# Patient Record
Sex: Male | Born: 1955 | Race: White | Hispanic: No | Marital: Married | Smoking: Former smoker
Health system: Southern US, Community
[De-identification: ages and names within clinical notes are randomized; demographics above are authoritative.]

## PROBLEM LIST (undated history)

## (undated) DIAGNOSIS — E0590 Thyrotoxicosis, unspecified without thyrotoxic crisis or storm: Secondary | ICD-10-CM

## (undated) DIAGNOSIS — E669 Obesity, unspecified: Secondary | ICD-10-CM

## (undated) DIAGNOSIS — C719 Malignant neoplasm of brain, unspecified: Secondary | ICD-10-CM

## (undated) DIAGNOSIS — I10 Essential (primary) hypertension: Secondary | ICD-10-CM

## (undated) DIAGNOSIS — C7931 Secondary malignant neoplasm of brain: Secondary | ICD-10-CM

## (undated) DIAGNOSIS — Z8719 Personal history of other diseases of the digestive system: Secondary | ICD-10-CM

## (undated) DIAGNOSIS — R739 Hyperglycemia, unspecified: Secondary | ICD-10-CM

## (undated) DIAGNOSIS — F419 Anxiety disorder, unspecified: Secondary | ICD-10-CM

## (undated) DIAGNOSIS — I340 Nonrheumatic mitral (valve) insufficiency: Secondary | ICD-10-CM

## (undated) DIAGNOSIS — C3491 Malignant neoplasm of unspecified part of right bronchus or lung: Secondary | ICD-10-CM

## (undated) DIAGNOSIS — IMO0001 Reserved for inherently not codable concepts without codable children: Secondary | ICD-10-CM

## (undated) DIAGNOSIS — I4819 Other persistent atrial fibrillation: Secondary | ICD-10-CM

## (undated) HISTORY — DX: Other persistent atrial fibrillation: I48.19

## (undated) HISTORY — DX: Essential (primary) hypertension: I10

## (undated) HISTORY — DX: Thyrotoxicosis, unspecified without thyrotoxic crisis or storm: E05.90

## (undated) HISTORY — DX: Hyperglycemia, unspecified: R73.9

## (undated) HISTORY — DX: Obesity, unspecified: E66.9

## (undated) HISTORY — DX: Nonrheumatic mitral (valve) insufficiency: I34.0

## (undated) HISTORY — PX: OTHER SURGICAL HISTORY: SHX169

## (undated) HISTORY — DX: Malignant neoplasm of unspecified part of right bronchus or lung: C34.91

## (undated) HISTORY — PX: TONSILLECTOMY: SUR1361

## (undated) HISTORY — PX: VASECTOMY: SHX75

## (undated) HISTORY — DX: Secondary malignant neoplasm of brain: C79.31

---

## 2006-02-08 ENCOUNTER — Encounter: Admission: RE | Admit: 2006-02-08 | Discharge: 2006-02-08 | Payer: Self-pay | Admitting: Internal Medicine

## 2008-03-26 ENCOUNTER — Encounter: Payer: Self-pay | Admitting: Cardiovascular Disease

## 2008-03-26 ENCOUNTER — Inpatient Hospital Stay (HOSPITAL_COMMUNITY): Admission: AD | Admit: 2008-03-26 | Discharge: 2008-03-28 | Payer: Self-pay | Admitting: Neurosurgery

## 2008-03-26 ENCOUNTER — Ambulatory Visit: Payer: Self-pay | Admitting: Cardiovascular Disease

## 2008-04-23 ENCOUNTER — Ambulatory Visit: Payer: Self-pay | Admitting: Cardiovascular Disease

## 2008-04-23 ENCOUNTER — Ambulatory Visit: Payer: Self-pay

## 2008-05-01 ENCOUNTER — Ambulatory Visit: Payer: Self-pay

## 2008-06-02 ENCOUNTER — Ambulatory Visit: Payer: Self-pay | Admitting: Cardiovascular Disease

## 2009-02-21 DIAGNOSIS — E669 Obesity, unspecified: Secondary | ICD-10-CM | POA: Insufficient documentation

## 2009-02-21 DIAGNOSIS — I878 Other specified disorders of veins: Secondary | ICD-10-CM | POA: Insufficient documentation

## 2009-02-21 DIAGNOSIS — I4819 Other persistent atrial fibrillation: Secondary | ICD-10-CM | POA: Insufficient documentation

## 2009-02-21 DIAGNOSIS — I10 Essential (primary) hypertension: Secondary | ICD-10-CM | POA: Insufficient documentation

## 2009-03-02 ENCOUNTER — Ambulatory Visit: Payer: Self-pay | Admitting: Cardiovascular Disease

## 2009-03-02 DIAGNOSIS — D692 Other nonthrombocytopenic purpura: Secondary | ICD-10-CM | POA: Insufficient documentation

## 2009-03-03 ENCOUNTER — Telehealth: Payer: Self-pay | Admitting: Cardiovascular Disease

## 2010-02-11 ENCOUNTER — Encounter: Payer: Self-pay | Admitting: Cardiology

## 2010-02-11 ENCOUNTER — Ambulatory Visit: Payer: Self-pay | Admitting: Cardiovascular Disease

## 2010-10-03 LAB — CONVERTED CEMR LAB
Basophils Relative: 0.3 % (ref 0.0–3.0)
Eosinophils Relative: 2.7 % (ref 0.0–5.0)
INR: 0.9 (ref 0.8–1.0)
Lymphocytes Relative: 22.2 % (ref 12.0–46.0)
PSA: 0.8 ng/mL (ref 0.10–4.00)
Platelets: 176 10*3/uL (ref 150.0–400.0)
RBC: 4.38 M/uL (ref 4.22–5.81)

## 2010-10-07 NOTE — Letter (Signed)
Summary: Return To Work  [employer], Main Office  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     Sincerely,   DR Asher Muir, LPN

## 2010-10-07 NOTE — Assessment & Plan Note (Signed)
Summary: per check out/sf   Primary Provider:  Dr Chilton Si   History of Present Illness: [name] seen today in followup for his paroxysmal atrial fibrillation.  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  When he  had his fibrillation he was somewhat oblivious to it  with no palpitations.  He's been well controlled without a recurrence in quite some time on low-dose flecainide   He had a nonischemic Myoview in August of 2009.  I reviewed his EKG today and his QT interval was only 406.  Otherwise he had normal sinus rhythm at a rate of 62 with a normal ECG.  Been compliant with his medications.  Coronary risk  factors are otherwise unremarkable he continues to be somewhat heavy due to obstructing lifestyle.  There's been no exertional chest pain PND or orthopnea he's had trace lower extremity edema.  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 Take One Tablet By Mouth Twice A Day 2)  Flecainide Acetate 50 Mg Tabs (Flecainide Acetate) .Marland Kitchen.. 1 Tab By Mouth Two Times A Day 3)  Aspirin Ec 325 Mg Tbec (Aspirin) .... Take One Tablet As Needed  Allergies (verified): No Known Drug Allergies  Past History:  Past Medical History: Last updated: 02/21/2009 Current Problems:  HYPERTENSION (ICD-401.9) PAROXYSMAL ATRIAL FIBRILLATION (ICD-427.31) OBESITY (ICD-278.00) EDEMA (ICD-782.3)  Past Surgical History: Last updated: 02/21/2009  lower back surgery with Dr. Phoebe Perch.  Herniated nucleus pulposus left L5-S1; semihemilaminectomy and   diskectomy with microdissection with microscope per Dr. Phoebe Perch.      Right cornea repair secondary to injury from a  bungee cord    tonsillectomy.   Family History: Last  updated: 03/02/2009 non contributory  Social History: Last updated: 03/02/2009 Married Trucker Non-drinker Non-smoker  Review of Systems       Denies fever, malais, weight loss, blurry vision, decreased visual acuity, cough, sputum, SOB, hemoptysis, pleuritic pain, palpitaitons, heartburn, abdominal pain, melena, lower extremity edema, claudication, or rash.   Vital Signs:  Patient profile:   55 year old male Height:      71 inches Weight:      269 pounds BMI:     37.65 Pulse rate:   57 / minute Resp:     12 per minute BP sitting:   120 / 80  (left arm)  Vitals Entered By: Kem Parkinson (February 11, 2010 9:07 AM)  Physical Exam  General:  Affect appropriate Healthy:  appears stated age HEENT: normal Neck supple with no adenopathy JVP normal no bruits no thyromegaly Lungs clear with no wheezing and good diaphragmatic motion Heart:  S1/S2 no murmur,rub, gallop or click PMI normal Abdomen: benighn, BS positve, no tenderness, no AAA no bruit.  No HSM or HJR Distal pulses intact with no bruits No edema Neuro non-focal Skin warm and dry    Impression & Recommendations:  Problem # 1:  HYPERTENSION (ICD-401.9) Well controlled His updated medication list for this problem includes:    Metoprolol Tartrate 25 Mg Tabs (Metoprolol tartrate) .Marland Kitchen... Take one tablet by mouth twice a day    Aspirin Ec 325 Mg Tbec (Aspirin) .Marland Kitchen... Take one tablet as needed  Problem # 2:  PAROXYSMAL ATRIAL FIBRILLATION (ICD-427.31)  Maint NSR  QT ok on ECG  His updated medication list for this problem includes:    Metoprolol Tartrate 25 Mg Tabs (Metoprolol tartrate) .Marland Kitchen... Take one tablet by mouth twice a day    Flecainide Acetate 50 Mg Tabs (Flecainide acetate) .Marland Kitchen... 1 tab by mouth two times a day    Aspirin Ec 325 Mg Tbec (Aspirin) .Marland Kitchen... Take one tablet as needed  Orders: EKG w/ Interpretation (93000)  Problem # 3:  EDEMA (ICD-782.3) Dependant.  Continue to work on weight and low sodium  intake  Problem # 4:  SPECIAL SCREENING MALIGNANT NEOPLASM OF PROSTATE (ICD-V76.44) Has not seen primary in long time (Art Green)  will check PSA Orders: TLB-PSA (Prostate Specific Antigen) (84153-PSA)  Patient Instructions: 1)  Your physician recommends that you schedule a follow-up appointment in: YEAR WITH DR Eden Emms 2)  Your physician recommends that you return for lab work in: TODAY PSA 3)  Your physician recommends that you continue on your current medications as directed. Please refer to the Current Medication list given to you today.   EKG Report  Procedure date:  02/11/2010  Findings:      NSR 57 QT 408 Low Voltage

## 2011-01-18 NOTE — Op Note (Signed)
NAMETarry, [name]              ACCOUNT NO.:  [id number]   MEDICAL RECORD NO.:  [id number]          PATIENT TYPE:  OIB   LOCATION:  2013                         FACILITY:  [facility]   PHYSICIAN:  Clydene Fake, M.D.  DATE OF BIRTH:  May 28, 1956   DATE OF PROCEDURE:  03/27/2008  DATE OF DISCHARGE:                               OPERATIVE REPORT   PREOPERATIVE DIAGNOSIS:  Herniated nucleus pulposus, left L5-S1.   POSTOPERATIVE DIAGNOSIS:  Herniated nucleus pulposus, left L5-S1.   PROCEDURE:  Left L5-S1 semihemilaminectomy and diskectomy,  microdissection with microscope.   SURGEON:  Clydene Fake, M.D.   ASSISTANT:  Stefani Dama, M.D.   ANESTHESIA:  General endotracheal tube.   ESTIMATED BLOOD LOSS:  Minimal.   BLOOD GIVEN:  None.   DRAINS:  None.   COMPLICATIONS:  None.   REASON FOR PROCEDURE:  The patient is a 55 year old gentleman who has  had back and left leg pain and numbness, found to have weakness in  plantarflexion.  Decreased sensation, left toes, was noticed.  Recent  MRI was done, showing large disc herniation, left side L5-S1, and  compression of the left S1 nerve root.  The patient was brought in for  decompression.   PROCEDURE IN DETAIL:  The patient was brought to the operating room, and  general anesthesia was induced.  The patient was placed in prone  position on a Wilson frame with all pressure points padded.  The patient  was prepped and draped in standard sterile fashion.  Stab incision was  injected with 17 mL of 1% lidocaine with epinephrine.  Needle was placed  in the interspace.  X-ray was obtained showing the needle was pointing  at the S1 spinous process.  Incision was then made where the needle was  placed.  Incision taken down to the fascia.  Hemostasis was obtained by  Bovie cauterization, and fascia was incised over the L5 and S1 spinous  process and subperiosteal dissection was done on the left side exposing  the spinous process,  lamina, and facet.  A self-retaining retractor was  placed, and marker was placed in the interspace.  X-rays were obtained  confirming positioning at L5-S1.  A high-speed drill was used to start a  semihemilaminectomy and medial facetectomy, and this was completed with  Kerrison punches and foraminotomy done over the S1 root.  A clinical  microscope was brought in for microdissection.  Ligamentum flavum was  removed.  We explored the epidural space and saw a large paramedian disc  herniation.  We incised into the disk space and diskectomy was then done  with pituitary rongeurs and curettes.  When we were finished, we had  good decompression of the central canal and S1 nerve root, and we  checked the L5 root, and it was also decompressed.  We irrigated with  antibiotic solution, had good hemostasis, retractor was  removed, fascia closed with 0 Vicryl interrupted sutures, subcutaneous  tissue closed with 0, 2-0, and 3-0 Vicryl interrupted sutures, skin  closed with benzoin and Steri-Strips.  Dressing was placed.  The patient  was placed  back into supine position, awoke from anesthesia, and  transferred to Recovery Room in stable condition.           ______________________________  Clydene Fake, M.D.     JRH/MEDQ  D:  03/27/2008  T:  03/28/2008  Job:  [id number]

## 2011-01-18 NOTE — Letter (Signed)
March 02, 2009    Safety [employer], [employer]   RE:  TAHJAE, CLAUSING  MRN:  [id number]  /  DOB:  1956-05-26   To Whom it May Concern:   Mr. Leonhard is a long-term patient of [facility].  He has a  fairly distant history of paroxysmal atrial fibrillation.  This has been  extremely well controlled.  He has never had documented coronary  disease.  He does not get palpitations.  He has been stable on low-dose  flecainide therapy.  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  If you have any questions, do not hesitate to  contact me.    Sincerely,      Noralyn Pick. Eden Emms, MD, [facility]  Electronically Signed    PCN/MedQ  DD: 03/02/2009  DT: 03/02/2009  Job #: [id number]   CC:    [name]

## 2011-01-18 NOTE — H&P (Signed)
[name], [name]              ACCOUNT NO.:  [id number]   MEDICAL RECORD NO.:  [id number]          PATIENT TYPE:  INP   LOCATION:  2013                         FACILITY:  [facility]   PHYSICIAN:  Jason Pick. Eden Emms, MD, FACCDATE OF BIRTH:  [birth year]   DATE OF ADMISSION:  03/27/2008  DATE OF DISCHARGE:                              HISTORY & PHYSICAL   PRIMARY CARE PHYSICIAN:  Jason Speed, MD   ORTHOPEDIC SURGEON:  Jason Fake, MD   CARDIOLOGIST:  [name], Jason Pick. Eden Emms, MD, [facility]   HISTORY OF PRESENT ILLNESS:  This is a 55 year old obese, Caucasian male  without prior cardiac history who was seen in Short Stay for preop labs  and EKG prior to lumbar laminectomy per Dr. Phoebe Perch secondary to pain in  the lower extremities.  A routine EKG found the patient to be in atrial  fibrillation with RVR with a ventricular rate of 115.  Dr. Phoebe Perch  requested a cardiology consultation secondary to this.  The patient has  been unaware of an irregular heart rate.  He states that he had been  evaluated at primary care approximately 1 week ago and was not told that  he had had an irregular heart rate at that time.  The patient denies any  chest pain, shortness of breath, dizziness, nausea, vomiting,  diaphoresis, or weakness.  The patient does admit to some lower  extremity pain on the left.  He also complains of occasional right eye  blindness, and states that he had had a carotid ultrasound completed  which was negative.   PAST MEDICAL HISTORY:  Lumbar disk disease.  He is negative for  diabetes, heart disease, hypertension, and peptic ulcer disease.   PAST SURGICAL HISTORY:  Right cornea repair secondary to injury from a  bungee cord and tonsillectomy.   SOCIAL HISTORY:  [place].  He  is a Naval architect.  He is a 35-pack-year smoker but quit 6 years ago  with rare EtOH use.   FAMILY HISTORY:  Mother is in good health.  Father is deceased from  cancer.  He has 1 brother with diabetes.   CURRENT MEDICATIONS:  Hydrocodone p.r.n.   ALLERGIES:  No known drug allergies.   CURRENT LABS:  Hemoglobin of 15.6, hematocrit 47.5, white blood cells  8.6, and platelets 214,000.  Sodium 139, potassium 4.7, chloride 104,  CO2 of 27, BUN 19, creatinine 1.3, and glucose 80.  PTT 28, PT 13.1, and  INR 1.0.  Urinalysis negative.  Chest x-ray revealing peribronchial  thickening with no active disease.  EKG reveals atrial fibrillation with  a ventricular rate of 115 beats per minute without any ischemic changes.   PHYSICAL EXAM:  VITAL SIGNS:  Blood pressure 128/74, pulse 109,  respirations 16, temperature 97.8, and O2 sat 96% on room air.  HEENT:  Head is normocephalic and atraumatic.  EYES:  PERRLA.  Mucous membranes of mouth pink and moist.  Tongue is  midline.  NECK:  Supple.  There is no JVD.  No carotid bruits appreciated.  CARDIOVASCULAR:  Irregular rhythm, tachycardiac without murmurs, rubs,  or gallops.  Pulses are 2+ and equal without bruits.  LUNGS:  Essentially clear to auscultation without wheezes, rales, or  rhonchi.  ABDOMEN:  Obese and nontender with 2+ bowel sounds.  EXTREMITIES:  Without clubbing, cyanosis, or edema.  NEURO:  Cranial nerves II through XII are grossly intact.   IMPRESSION:  1. Knapp onset atrial fibrillation of unknown duration.  2. Lumbar disk disease.   PLAN:  This is a 55 year old obese Caucasian male who was seen in Short  Stay today for preop evaluation, labs, and routine EKG, was found to be  in Knapp onset atrial fibrillation, unknown duration.  The patient had  planned lumbar disk surgery in the a.m. of March 27, 2008.  Secondary to  the abnormal EKG, we were asked to see the patient.  Our plan Jason be to  admit the patient and start Lopressor 25 mg b.i.d.  We Jason check  echocardiogram tonight, and the patient is still eligible for back  surgery.  He Jason need anticoagulation postoperatively.  He has  also  been advised that he can use esmolol during OR if it is needed.  The  patient has been advised of this and is willing to be admitted.  Dr.  Charlton Haws has spoken with Dr. Phoebe Perch concerning the patient's status  and admission.  The patient Jason be admitted to telemetry, and we Jason  follow making further recommendations after echocardiogram has been  completed.      Jason Mare. Lyman Bishop, NP      Jason Pick. Eden Emms, MD, [facility]  Electronically Signed    KML/MEDQ  D:  03/26/2008  T:  03/27/2008  Job:  [id number]   cc:   [name], M.D.

## 2011-01-18 NOTE — Assessment & Plan Note (Signed)
[facility]                            CARDIOLOGY OFFICE NOTE   NAME:Windmiller, [name]                     MRN:          [id number]  DATE:04/23/2008                            DOB:          [birth year]    Mr. [name] returns today for followup.  I initially saw him as a  consult in the PACU prior to lower back surgery with Dr. Phoebe Perch.  He was  in atrial fibrillation, which was asymptomatic.  He had never had a  previous diagnosis of this.  We started him on beta-blockers and allowed  him to have a surgery.  His surgery was uncomplicated.  He had an  echocardiogram prior to surgery, which showed normal LV function and no  significant valvular heart disease.   The patient has done well since being discharged from the hospital.  He  continues to have some paresthesias in his feet.   He has a followup appointment with Dr. Phoebe Perch given the fact that we  initially saw him as an unknown entity and he was needing surgery.  Coumadin was not started.  I had a long discussion with the patient  today involving our options.  I explained to him that with atrial  fibrillation, there are issues regarding rate control conversion and  anticoagulation.   He is a bit resident to be on Coumadin.  He is also a bit resident at  this time to undergo any type of cardioversion.  He was in atrial  fibrillation at a rate of 90 in the office today.   After much discussion, we decided to do the following.   It was fairly clear to me that the patient was at least in and out of  atrial fibrillation quite a bit, the only 2 times I have seen him, he  was in it.  We will start flecainide 50 b.i.d. as he does not appear to  have structural heart disease and is asymptomatic.  The risk of  proarrhythmia was discussed along this line.  He will follow up with a  stress Myoview next week to rule out concomitant coronary artery disease  and assess his ability to exercise.   I will  give him an event monitor for 30 days and see how often he is in  atrial fibrillation.  I told him to make sure he transmit say at least  twice a day regardless of his symptoms.  He seems to be happier with  this plan.   REVIEW OF SYSTEMS:  Otherwise negative in particular, he has not had any  significant TIAs, palpitations, chest pain, PND, or orthopnea.  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   PHYSICAL EXAMINATION:  GENERAL:  Remarkable for an obese white male in  no distress.  VITAL SIGNS:  Blood pressure is 130/70; pulse 94 and regular; and  respiratory rate 14, afebrile.  HEENT:  Unremarkable.  NECK:  Carotids are normal without bruit.  No lymphadenopathy,  thyromegaly, or JVP elevation.  LUNGS:  Clear, good diaphragmatic motion.  No wheezing.  CARDIAC:  S1 and  S2 with normal heart sounds.  PMI normal.  ABDOMEN:  Benign.  Bowel sounds positive.  No AAA.  No tenderness,  No  bruit.  No hepatosplenomegaly or hepatojugular  Reflux.  No tenderness.  EXTREMITIES:  Distal pulses are intact.  No edema.  NEURO:  Nonfocal.  SKIN:  Warm and dry.  MUSCULOSKELETAL:  No muscular weakness.  He is status post lumbar  surgery.   MEDICATIONS:  He is on aspirin a day and Lopressor 25 b.i.d.   His EKG shows atrial fibrillation with a QT interval of 316, nonspecific  ST-T wave changes.   His weight is 267.   IMPRESSION:  1. Atrial fibrillation.  Lengthy discussion regarding options for      antiarrhythmics rate control and anticoagulation.  Continue aspirin      for anticoagulation.  Continue Lopressor at current dose for rate      control, may need to increase in the future.  Add flecainide 50      b.i.d. for stabilization of rhythm.  2. Rule out concomitant coronary artery disease given the use of      antiarrhythmic stress Myoview in next week.  3. Recent lumbar back surgery.  Follow up Dr. Phoebe Perch  4. Continued paresthesias.      Noralyn Pick. Eden Emms, MD, [facility]   Electronically Signed    PCN/MedQ  DD: 04/23/2008  DT: 04/23/2008  Job #: [id number]

## 2011-01-18 NOTE — Assessment & Plan Note (Signed)
[facility]                            CARDIOLOGY OFFICE NOTE   NAME:Boonstra, Vence Judie Petit                     MRN:          [id number]  DATE:06/02/2008                            DOB:          01-31-1956    Mr. Corriher returns today in followup.  He is back to work.  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  He has had PAF.   His event monitor confirmed this.  He has not had any symptoms on  flecainide.  Unfortunately, he really does not know if he is in and out  of fibrillation.  I told him how to take his pulse.  He will also  possibly buy an inexpensive stethoscope since his radial pulse is  difficult to palpate.  For the time being, his [place] score is under 2 and  we will keep him on aspirin.   He knows to call me, should he notice any irregular high heart rates.  He will continue his beta-blocker and flecainide.   REVIEW OF SYSTEMS:  Otherwise negative.  In particularly, he has not had  any significant chest pain, PND, orthopnea, or syncope.  He has had  trace lower extremity edema.   We had a bit of discussion today about his diet and weight.  He has way  too much bread and is significantly obese.   MEDICATIONS:  1. Aspirin.  2. Lopressor 25 b.i.d.  3. Flecainide 50 b.i.d.   PHYSICAL EXAMINATION:  GENERAL:  Remarkable for an overweight white male  in no distress.  VITAL SIGNS:  His weight is 267, blood pressure is 110/66, pulse 66 and  regular, respiratory rate 14, afebrile.  HEENT:  Unremarkable.  NECK:  Carotids normal without bruit.  No lymphadenopathy, thyromegaly,  or JVP elevation.  LUNGS:  Clear.  Good diaphragmatic motion.  No wheezing.  S1 and S2 with  normal heart sounds.  PMI normal.  ABDOMEN:  Benign.  Bowel sounds positive.  No AAA.  No tenderness.  No  bruit.  No hepatosplenomegaly or hepatojugular reflux.  No tenderness.  EXTREMITIES:  Distal pulses are intact.  No edema.  NEUROLOGICAL:  Nonfocal.  SKIN:  Warm and  dry.  MUSCULOSKELETAL:  No muscular weakness.   IMPRESSION:  1. Paroxysmal atrial fibrillation.  Continue current dose of      flecainide and aspirin.  2. Hypertension, currently well controlled.  Continue low-dose      Lopressor.  3. Lower extremity edema.  Decrease salt intake, dependent secondary      to central obesity.  4. Central obesity.  Decrease caloric intake particularly bread,      target weight 10 pounds less than 6 months.   I will see the patient back in 6 months.  His event monitor was reviewed  and confirmed episodes of paroxysmal atrial fibrillation prior to  institution of flecainide.     Noralyn Pick. Eden Emms, MD, [facility]  Electronically Signed    PCN/MedQ  DD: 06/02/2008  DT: 06/03/2008  Job #: [id number]

## 2011-01-18 NOTE — Discharge Summary (Signed)
NAMEVega, [name]              ACCOUNT NO.:  [id number]   MEDICAL RECORD NO.:  [id number]          PATIENT TYPE:  INP   LOCATION:  2013                         FACILITY:  [facility]   PHYSICIAN:  Noralyn Pick. Eden Emms, MD, FACCDATE OF BIRTH:  1956/04/10   DATE OF ADMISSION:  03/26/2008  DATE OF DISCHARGE:  03/28/2008                               DISCHARGE SUMMARY   REQUESTING PHYSICIAN:  Clydene Fake, MD   PRIMARY CARDIOLOGIST:  Noralyn Pick. Eden Emms, MD, [facility]   PRIMARY CARE PHYSICIAN:  Not listed.   CONSULTING PHYSICIAN:  Clydene Fake, MD   PROCEDURES PERFORMED DURING HOSPITALIZATION:  1. Echocardiogram.  2. Herniated nucleus pulposus left L5-S1; semihemilaminectomy and      diskectomy with microdissection with microscope per Dr. Phoebe Perch.   FINAL DISCHARGE DIAGNOSES:  1. Herniated disk of the L5-S1.  2. Transient atrial fibrillation.   HOSPITAL COURSE:  This is a 55 year old obese Caucasian male with no  prior cardiac history who we saw in Short Stay at the request of Dr.  Phoebe Perch for preop evaluation and was found to be in atrial fibrillation  with RVR.  The patient had been unaware of his irregular heart rate and  he was evaluated by primary care prior to his admission and no one had  him his heart rate was irregular.  However[facility], and was  found to be in atrial fibrillation.  Dr. Phoebe Perch requested that we  evaluate the patient and admit if necessary prior to his surgery  secondary to this new-onset atrial fibrillation.  The patient was seen  and examined by myself and Dr. Charlton Haws and it was found that he was  indeed in atrial fibrillation with a ventricular rate of 115 beats per  minute.  The patient was started on p.o. Lopressor 25 mg b.i.d. and  converted to normal sinus rhythm within 6 hours of use of the Lopressor.  The patient had no further episodes of atrial fibrillation.  An  echocardiogram was completed prior to surgery for evaluation of LV  dysfunction and was found to be normal with LVEF of 50-55%.  There was  no diagnostic left ventricular wall motion abnormalities.   The patient did undergo disk surgery per Dr. Phoebe Perch the following day  without any complications and was discharged home later that afternoon  with no recurrence of atrial fibrillation.  The patient was to follow up  with Cardiology as an outpatient for continued evaluation at Dr.  Doreen Beam discretion.  A followup appointment was made for April 18, 2008, at 10:15 a.m. with Dr. Eden Emms to follow up.   DISCHARGE LABORATORY DATA:  TSH 1.880, sodium 139, potassium 4.7,  chloride 104, CO2 27, glucose 80, BUN 19, and creatinine 1.31.  PTT 28,  PT 13.1, and INR 1.0.  UA negative for UTI.  CBC:  Hemoglobin 15,  hematocrit 47.5, white blood cells 8.6, and platelets 214.  Chest x-ray  read peribronchial thickening, but no active disease.   DISCHARGE MEDICATIONS:  1. Aspirin 325 mg daily.  2. Lopressor 25 mg twice a day.   ALLERGIES:  No known drug allergies.   FOLLOWUP APPOINTMENTS:  1. The patient will be followed by Dr. Charlton Haws on April 18, 2008, at 10:15 a.m.  2. The patient will be followed by Dr. Phoebe Perch as an outpatient for      postoperative evaluation and continuation of treatment.   TIME SPENT WITH THE PATIENT TO INCLUDE PHYSICIAN TIME:  45 minutes.      Bettey Mare. Lyman Bishop, NP      Noralyn Pick. Eden Emms, MD, [facility]  Electronically Signed    KML/MEDQ  D:  04/20/2008  T:  04/21/2008  Job:  [id number]

## 2011-01-21 NOTE — Letter (Signed)
May 15, 2008    To Whomever It May Concern.   RE:  Jason, Knapp  MRN:  [id number]  /  DOB:  1955/12/06   [name] is a 55 year old patient who has been seen by [facility].  He had a brief episode of atrial arrhythmia prior to lumbar  surgery on March 27, 2008.  He has been worked up thoroughly since that  time.  His arrhythmia has resolved.  He has good heart function and no  evidence of coronary artery disease.  He has successfully completed a  stress test on May 01, 2008 without any significant abnormalities.  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  There should be no  limitations on his ability in regards to his driver's license.   If you have any questions, do not hesitate to contact me.    Sincerely,      Noralyn Pick. Eden Emms, MD, [facility]  Electronically Signed    PCN/MedQ  DD: 05/15/2008  DT: 05/16/2008  Job #: [id number]

## 2011-02-04 ENCOUNTER — Encounter: Payer: Self-pay | Admitting: Cardiovascular Disease

## 2011-02-21 ENCOUNTER — Encounter: Payer: Self-pay | Admitting: Cardiology

## 2011-02-21 ENCOUNTER — Encounter: Payer: Self-pay | Admitting: Cardiovascular Disease

## 2011-02-21 ENCOUNTER — Ambulatory Visit (INDEPENDENT_AMBULATORY_CARE_PROVIDER_SITE_OTHER): Payer: BC Managed Care – PPO | Admitting: Cardiovascular Disease

## 2011-02-21 DIAGNOSIS — R609 Edema, unspecified: Secondary | ICD-10-CM

## 2011-02-21 DIAGNOSIS — I10 Essential (primary) hypertension: Secondary | ICD-10-CM

## 2011-02-21 DIAGNOSIS — I4891 Unspecified atrial fibrillation: Secondary | ICD-10-CM

## 2011-02-21 MED ORDER — METOPROLOL TARTRATE 25 MG PO TABS: 25.0000 mg | ORAL_TABLET | Freq: Two times a day (BID) | ORAL | Status: DC

## 2011-02-21 MED ORDER — FLECAINIDE ACETATE 50 MG PO TABS: 50.0000 mg | ORAL_TABLET | Freq: Two times a day (BID) | ORAL | Status: DC

## 2011-02-21 NOTE — Progress Notes (Signed)
[name] seen today in followup for his paroxysmal atrial fibrillation. 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 When he had his fibrillation he was somewhat oblivious to it with no palpitations. He's been well controlled without a recurrence in quite some time on low-dose flecainide He had a nonischemic Myoview in August of 2009. I reviewed his EKG today and his QT interval was only 406. Otherwise he had normal sinus rhythm at a rate of 62 with a normal ECG. Been compliant with his medications. Coronary risk factors are otherwise unremarkable he continues to be somewhat heavy due to trucking  lifestyle. There's been no exertional chest pain PND or orthopnea he's had trace lower extremity edema. This is dependent  Needs [employer] refill on meds and DOT letter  ROS: Denies fever, malais, weight loss, blurry vision, decreased visual acuity, cough, sputum, SOB, hemoptysis, pleuritic pain, palpitaitons, heartburn, abdominal pain, melena, lower extremity edema, claudication, or rash.  All other systems reviewed and negative  General: Affect appropriate Healthy:  appears stated age HEENT: normal Neck supple with no adenopathy JVP normal no bruits no thyromegaly Lungs clear with no wheezing and good diaphragmatic motion Heart:  S1/S2 no murmur,rub, gallop or click PMI normal Abdomen: benighn, BS positve, no tenderness, no AAA no bruit.  No HSM or HJR Distal pulses intact with no bruits No edema Neuro non-focal Skin warm and dry No muscular weakness   Current Outpatient Prescriptions  Medication Sig Dispense Refill  . aspirin EC 325 MG tablet Take 325 mg by mouth daily as needed.        . flecainide (TAMBOCOR) 50 MG tablet Take 50 mg by mouth 2 (two) times daily.        . metoprolol tartrate (LOPRESSOR) 25 MG tablet Take 25 mg by mouth 2 (two) times daily.          Allergies  Review of patient's allergies indicates no known allergies.  Electrocardiogram:  NSR 62 Normal ECG  QRS 84  QT 400  Assessment and Plan

## 2011-02-21 NOTE — Assessment & Plan Note (Signed)
Well controlled.  Continue current medications and low sodium Dash type diet.    

## 2011-02-21 NOTE — Assessment & Plan Note (Signed)
maint NSR.  ECG looks great.  Continue ASA/Flecainide and beta blocker

## 2011-02-21 NOTE — Patient Instructions (Addendum)
Your physician recommends that you schedule a follow-up appointment in: 1 year  

## 2011-02-21 NOTE — Assessment & Plan Note (Signed)
Dependant.  Continue low sodium diet

## 2011-06-03 LAB — PROTIME-INR
INR: 1
Prothrombin Time: 13.1

## 2011-06-03 LAB — URINALYSIS, ROUTINE W REFLEX MICROSCOPIC
Glucose, UA: NEGATIVE
Hgb urine dipstick: NEGATIVE
Ketones, ur: NEGATIVE
Specific Gravity, Urine: 1.028
Urobilinogen, UA: 1
pH: 5

## 2011-06-03 LAB — CBC
HCT: 47.5
Hemoglobin: 15.6
Platelets: 214
RBC: 5.04
RDW: 13

## 2011-06-03 LAB — APTT: aPTT: 28

## 2011-06-03 LAB — BASIC METABOLIC PANEL
BUN: 19
Creatinine, Ser: 1.31

## 2012-02-13 ENCOUNTER — Ambulatory Visit (INDEPENDENT_AMBULATORY_CARE_PROVIDER_SITE_OTHER): Payer: BC Managed Care – PPO | Admitting: Cardiovascular Disease

## 2012-02-13 ENCOUNTER — Encounter: Payer: Self-pay | Admitting: *Deleted

## 2012-02-13 ENCOUNTER — Encounter: Payer: Self-pay | Admitting: Cardiovascular Disease

## 2012-02-13 VITALS — BP 128/80 | HR 76 | Ht 70.0 in | Wt 231.8 lb

## 2012-02-13 DIAGNOSIS — I4891 Unspecified atrial fibrillation: Secondary | ICD-10-CM

## 2012-02-13 DIAGNOSIS — E669 Obesity, unspecified: Secondary | ICD-10-CM

## 2012-02-13 DIAGNOSIS — I10 Essential (primary) hypertension: Secondary | ICD-10-CM

## 2012-02-13 LAB — HEPATIC FUNCTION PANEL
ALT: 29 U/L (ref 0–53)
AST: 27 U/L (ref 0–37)
Albumin: 3.5 g/dL (ref 3.5–5.2)
Alkaline Phosphatase: 89 U/L (ref 39–117)

## 2012-02-13 LAB — BASIC METABOLIC PANEL
BUN: 18 mg/dL (ref 6–23)
CO2: 25 mEq/L (ref 19–32)
Potassium: 4.2 mEq/L (ref 3.5–5.1)

## 2012-02-13 LAB — CBC WITH DIFFERENTIAL/PLATELET
Basophils Absolute: 0 10*3/uL (ref 0.0–0.1)
Basophils Relative: 0.2 % (ref 0.0–3.0)
Eosinophils Absolute: 0.1 10*3/uL (ref 0.0–0.7)
HCT: 44.5 % (ref 39.0–52.0)
Lymphocytes Relative: 24.9 % (ref 12.0–46.0)
Lymphs Abs: 1.7 10*3/uL (ref 0.7–4.0)
MCHC: 32.6 g/dL (ref 30.0–36.0)
Neutro Abs: 4.2 10*3/uL (ref 1.4–7.7)
Neutrophils Relative %: 60.7 % (ref 43.0–77.0)

## 2012-02-13 LAB — LIPID PANEL
Cholesterol: 126 mg/dL (ref 0–200)
HDL: 32.7 mg/dL — ABNORMAL LOW (ref >39.00)
LDL Cholesterol: 71 mg/dL (ref 0–99)

## 2012-02-13 NOTE — Patient Instructions (Addendum)
Your physician wants you to follow-up in: YEAR WITH  DR Haywood Filler will receive a reminder letter in the mail two months in advance. If you don't receive a letter, please call our office to schedule the follow-up appointment. Your physician recommends that you continue on your current medications as directed. Please refer to the Current Medication list given to you today. Your physician recommends that you return for lab work in: TODAY  BMET CBC LIPID LIVER DX   401.1  [phone]

## 2012-02-13 NOTE — Assessment & Plan Note (Signed)
Maint NSR continue flecainide.  ECG normal

## 2012-02-13 NOTE — Progress Notes (Signed)
Patient ID: [name], male   DOB: [birth year], 56 y.o.   MRN: [id number] [name] seen today in followup for his paroxysmal atrial fibrillation. 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 When he had his fibrillation he was somewhat oblivious to it with no palpitations. He's been well controlled without a recurrence in quite some time on low-dose flecainide He had a nonischemic Myoview in August of 2009. I reviewed his EKG today and his QT interval was only 396 . Otherwise he had normal sinus rhythm at a rate of 62 with a normal ECG. Been compliant with his medications. Coronary risk factors are otherwise unremarkable he continues to be somewhat heavy due to trucking lifestyle. There's been no exertional chest pain PND or orthopnea he's had trace lower extremity edema. This is dependent Needs  DOT letter again.  Sees [name] but not happy and requests blood work today.    ROS: Denies fever, malais, weight loss, blurry vision, decreased visual acuity, cough, sputum, SOB, hemoptysis, pleuritic pain, palpitaitons, heartburn, abdominal pain, melena, lower extremity edema, claudication, or rash.  All other systems reviewed and negative  General: Weight down from 272  To 231  Affect appropriate Healthy:  appears stated age HEENT: normal Neck supple with no adenopathy JVP normal no bruits no thyromegaly Lungs clear with no wheezing and good diaphragmatic motion Heart:  S1/S2 no murmur, no rub, gallop or click PMI normal Abdomen: benighn, BS positve, no tenderness, no AAA no bruit.  No HSM or HJR Distal pulses intact with no bruits No edema Neuro non-focal Skin warm and dry No muscular weakness   Current Outpatient Prescriptions  Medication Sig Dispense Refill  . aspirin EC 325 MG tablet Take 325 mg by mouth daily as needed.        . flecainide (TAMBOCOR) 50 MG tablet Take 1 tablet (50 mg total) by mouth 2 (two) times daily.  180 tablet  6  . metoprolol tartrate (LOPRESSOR) 25 MG  tablet Take 1 tablet (25 mg total) by mouth 2 (two) times daily.  180 tablet  6    Allergies  Review of patient's allergies indicates no known allergies.  Electrocardiogram: NSR rate 79 QT 396 normal  Assessment and Plan

## 2012-02-13 NOTE — Assessment & Plan Note (Signed)
Well controlled.  Continue current medications and low sodium Dash type diet.   Reassured him that low diastolic is ok.  No AR

## 2012-02-13 NOTE — Assessment & Plan Note (Signed)
Improved since November but still has a way to go.  Low carb diet discussed

## 2012-02-16 ENCOUNTER — Telehealth: Payer: Self-pay | Admitting: Cardiovascular Disease

## 2012-02-16 NOTE — Telephone Encounter (Signed)
PT AWARE OF LAB RESULTS./CY 

## 2012-02-16 NOTE — Telephone Encounter (Signed)
Patient call for lab results, he can be reached at [phone].

## 2012-07-25 ENCOUNTER — Other Ambulatory Visit: Payer: Self-pay | Admitting: Cardiovascular Disease

## 2012-07-25 MED ORDER — FLECAINIDE ACETATE 50 MG PO TABS: 50.0000 mg | ORAL_TABLET | Freq: Two times a day (BID) | ORAL | Status: DC

## 2012-07-25 MED ORDER — METOPROLOL TARTRATE 25 MG PO TABS: 25.0000 mg | ORAL_TABLET | Freq: Two times a day (BID) | ORAL | Status: DC

## 2012-07-25 NOTE — Telephone Encounter (Signed)
Pt needs refills sent in asap

## 2013-03-04 ENCOUNTER — Encounter (INDEPENDENT_AMBULATORY_CARE_PROVIDER_SITE_OTHER): Payer: BC Managed Care – PPO

## 2013-03-04 ENCOUNTER — Encounter: Payer: Self-pay | Admitting: Cardiovascular Disease

## 2013-03-04 ENCOUNTER — Ambulatory Visit (INDEPENDENT_AMBULATORY_CARE_PROVIDER_SITE_OTHER): Payer: BC Managed Care – PPO | Admitting: Cardiovascular Disease

## 2013-03-04 VITALS — BP 138/82 | HR 87 | Ht 70.0 in | Wt 241.4 lb

## 2013-03-04 DIAGNOSIS — I4891 Unspecified atrial fibrillation: Secondary | ICD-10-CM

## 2013-03-04 DIAGNOSIS — I10 Essential (primary) hypertension: Secondary | ICD-10-CM

## 2013-03-04 DIAGNOSIS — Z79899 Other long term (current) drug therapy: Secondary | ICD-10-CM

## 2013-03-04 MED ORDER — FLECAINIDE ACETATE 100 MG PO TABS: 100.0000 mg | ORAL_TABLET | Freq: Two times a day (BID) | ORAL | Status: DC

## 2013-03-04 NOTE — Addendum Note (Signed)
Addended by: Scherrie Bateman E on: 03/04/2013 09:57 AM   Modules accepted: Orders

## 2013-03-04 NOTE — Progress Notes (Signed)
PER DR NISHAN NEED TO MONITOR  FREQUENCY OF  AFIB OVER  21 DAYS   AS PT  DOES NOT KNOW WHEN IS IN  AFIB./CY

## 2013-03-04 NOTE — Assessment & Plan Note (Signed)
The asymptomatic nature of his afib makes it hard to Rx.  Increase flecainide and f/u ETT 2 weeks  Event monitor Continue beta blocker  F/U 8 weeks and depending on burden of afib may need to change tactics of Rx

## 2013-03-04 NOTE — Assessment & Plan Note (Signed)
Well controlled.  Continue current medications and low sodium Dash type diet.    

## 2013-03-04 NOTE — Patient Instructions (Addendum)
Your physician recommends that you schedule a follow-up appointment in: 8-10 WEEKS WITH DR [facility]  Your physician has recommended you make the following change in your medication: INCREASE FLECAINIDE TO 100 MG  TWICE DAILY   Your physician has requested that you have an exercise tolerance test. For further information please visit [email or web address] Please also follow instruction sheet, as given. IN 2 WEEKS  Your physician has recommended that you wear an event monitor. Event monitors are medical devices that record the heart's electrical activity. Doctors most often [place] these monitors to diagnose arrhythmias. Arrhythmias are problems with the speed or rhythm of the heartbeat. The monitor is a small, portable device. You can wear one while you do your normal daily activities. This is usually used to diagnose what is causing palpitations/syncope (passing out). 21 DAY EVENT

## 2013-03-04 NOTE — Progress Notes (Signed)
Patient ID: [name], male   DOB: 1955/11/26, 57 y.o.   MRN: [id number] [name] seen today in followup for his paroxysmal atrial fibrillation. 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 When he had his fibrillation he was somewhat oblivious to it with no palpitations. He's been well controlled without a recurrence in quite some time on low-dose flecainide He had a nonischemic Myoview in August of 2009. I reviewed his EKG today and his QT interval was only 406. Otherwise he had normal sinus rhythm at a rate of 62 with a normal ECG. Been compliant with his medications. Coronary risk factors are otherwise unremarkable he continues to be somewhat heavy due to trucking lifestyle. There's been no exertional chest pain PND or orthopnea he's had trace lower extremity edema. This is dependent   Back in afib today He is unaware/ Discussed options. Will need event monitor with daily recordings.  Increased flecainide and ETT in 2 weeks. Will need to address anticoagulation based on results  ROS: Denies fever, malais, weight loss, blurry vision, decreased visual acuity, cough, sputum, SOB, hemoptysis, pleuritic pain, palpitaitons, heartburn, abdominal pain, melena, lower extremity edema, claudication, or rash.  All other systems reviewed and negative  General: Affect appropriate Healthy:  appears stated age HEENT: normal Neck supple with no adenopathy JVP normal no bruits no thyromegaly Lungs clear with no wheezing and good diaphragmatic motion Heart:  S1/S2 no murmur, no rub, gallop or click PMI normal Abdomen: benighn, BS positve, no tenderness, no AAA no bruit.  No HSM or HJR Distal pulses intact with no bruits No edema Neuro non-focal Skin warm and dry No muscular weakness   Current Outpatient Prescriptions  Medication Sig Dispense Refill  . aspirin EC 325 MG tablet Take 325 mg by mouth daily as needed.        . flecainide (TAMBOCOR) 50 MG tablet Take 1 tablet (50 mg total) by  mouth 2 (two) times daily.  180 tablet  6  . metoprolol tartrate (LOPRESSOR) 25 MG tablet Take 1 tablet (25 mg total) by mouth 2 (two) times daily.  180 tablet  6   No current facility-administered medications for this visit.    Allergies  Review of patient's allergies indicates no known allergies.  Electrocardiogram:  afib rate 87 QT 360   Assessment and Plan

## 2013-03-22 ENCOUNTER — Encounter: Payer: Self-pay | Admitting: Nurse Practitioner

## 2013-03-22 ENCOUNTER — Ambulatory Visit (INDEPENDENT_AMBULATORY_CARE_PROVIDER_SITE_OTHER): Payer: BC Managed Care – PPO | Admitting: Nurse Practitioner

## 2013-03-22 VITALS — BP 120/70 | HR 96 | Resp 20

## 2013-03-22 DIAGNOSIS — Z79899 Other long term (current) drug therapy: Secondary | ICD-10-CM

## 2013-03-22 DIAGNOSIS — I4891 Unspecified atrial fibrillation: Secondary | ICD-10-CM

## 2013-03-22 LAB — CBC WITH DIFFERENTIAL/PLATELET
Basophils Absolute: 0 10*3/uL (ref 0.0–0.1)
Basophils Relative: 0.4 % (ref 0.0–3.0)
Eosinophils Absolute: 0.1 10*3/uL (ref 0.0–0.7)
Eosinophils Relative: 1.2 % (ref 0.0–5.0)
HCT: 40.6 % (ref 39.0–52.0)
Hemoglobin: 13.5 g/dL (ref 13.0–17.0)
Lymphocytes Relative: 25 % (ref 12.0–46.0)
Lymphs Abs: 1.9 10*3/uL (ref 0.7–4.0)
MCHC: 33.2 g/dL (ref 30.0–36.0)
MCV: 91.1 fl (ref 78.0–100.0)
Monocytes Absolute: 0.9 10*3/uL (ref 0.1–1.0)
Monocytes Relative: 11.2 % (ref 3.0–12.0)
Neutro Abs: 4.8 10*3/uL (ref 1.4–7.7)
Neutrophils Relative %: 62.2 % (ref 43.0–77.0)
Platelets: 132 10*3/uL — ABNORMAL LOW (ref 150.0–400.0)
RBC: 4.45 Mil/uL (ref 4.22–5.81)
RDW: 13.7 % (ref 11.5–14.6)
WBC: 7.7 10*3/uL (ref 4.5–10.5)

## 2013-03-22 LAB — BASIC METABOLIC PANEL
BUN: 13 mg/dL (ref 6–23)
CO2: 28 mEq/L (ref 19–32)
Calcium: 8.9 mg/dL (ref 8.4–10.5)
Chloride: 106 mEq/L (ref 96–112)
Creatinine, Ser: 0.9 mg/dL (ref 0.4–1.5)
GFR: 97.83 mL/min (ref >60.00)
Glucose, Bld: 120 mg/dL — ABNORMAL HIGH (ref 70–99)
Potassium: 4.1 mEq/L (ref 3.5–5.1)
Sodium: 140 mEq/L (ref 135–145)

## 2013-03-22 MED ORDER — RIVAROXABAN 20 MG PO TABS: 20.0000 mg | ORAL_TABLET | Freq: Every day | ORAL | Status: DC

## 2013-03-22 NOTE — Patient Instructions (Signed)
You are still out of rhythm  We will need to start Xarelto 20 mg with your largest meal of the day - this is to thin your blood.  We will plan on seeing you in 3 1/2 weeks - if you are still out of rhythm we will plan for a cardioversion (to shock your heart back into rhythm)  Stop your aspirin on Sunday  Stay on your other medicines  We are checking labs today  Call the [facility] office at [phone] if you have any questions, problems or concerns.

## 2013-03-22 NOTE — Progress Notes (Signed)
[name] Date of Birth: 06/15/1956 Medical Record #[id number]  History of Present Illness: Mr. Wajda is seen back today for what was to be a GXT. He is seen for Dr. Eden Emms. He has PAF on chronic Flecainide and aspirin therapy. Other issues include HTN and obesity. Last echo was 5 years ago.   Was seen here at the end of June - was back in atrial fib - he was asymptomatic. Flecainide was increased and event monitor was placed with plans for GXT.  Comes in today. Here alone. Feels fine. No symptoms whatsoever. Not dizzy or lightheaded. No chest pain. No palpitations. He remains in atrial fib. His daily tracings from [facility] have continued to show atrial fib.    Current Outpatient Prescriptions  Medication Sig Dispense Refill  . aspirin EC 325 MG tablet Take 325 mg by mouth daily as needed.        . flecainide (TAMBOCOR) 100 MG tablet Take 1 tablet (100 mg total) by mouth 2 (two) times daily.  180 tablet  6  . metoprolol tartrate (LOPRESSOR) 25 MG tablet Take 1 tablet (25 mg total) by mouth 2 (two) times daily.  180 tablet  6  . Rivaroxaban (XARELTO) 20 MG TABS Take 1 tablet (20 mg total) by mouth daily.  30 tablet  6   No current facility-administered medications for this visit.    No Known Allergies  Past Medical History  Diagnosis Date  . HTN (hypertension)   . Paroxysmal atrial fibrillation   . Obesity   . Edema   . High risk medication use     on Flecaindie    Past Surgical History  Procedure Laterality Date  . Lower back surgery      herniated nucleus pulposus left L5-S1; semihemilaminectomy and diskectomy with microdissection with microscope  . Right cornea repair      secondary to injury from a bungee cord  . Tonsillectomy      History  Smoking status  . Former Smoker  Smokeless tobacco  . Not on file    History  Alcohol Use No    History reviewed. No pertinent family history.  Review of Systems: The review of systems is per the HPI.  All  other systems were reviewed and are negative.  Physical Exam: BP 120/70  Pulse 96  Resp 20 Patient is very pleasant and in no acute distress. He is obese. Skin is warm and dry. Color is normal.  HEENT is unremarkable. Normocephalic/atraumatic. PERRL. Sclera are nonicteric. Neck is supple. No masses. No JVD. Lungs are clear. Cardiac exam shows an irregular rhythm. Abdomen is soft. Extremities are without edema. Gait and ROM are intact. No gross neurologic deficits noted.  LABORATORY DATA: EKG shows atrial fib with a rate of 96.  Labs are pending.  ECHO SUMMARY FROM July 2009 - Overall left ventricular systolic function was at the lower limits of normal. Left ventricular ejection fraction was estimated , range being 50 % to 55 %. Although no diagnostic left ventricular regional wall motion abnormality was identified, this possibility cannot be completely excluded on the basis of this study. Left ventricular wall thickness was mildly increased. - Aortic valve thickness was mildly increased.   Assessment / Plan: 1. Atrial fib - persistent - I have spoken to Dr. Graciela Husbands (DOD today). We are cancelling his GXT for today that was to rule out proarrhythmia. Will start anticoagulation with Xarelto 20 mg a day. Continue his other medicines for now. Check baseline labs  today. Update his echo as well. Proceed with cardioversion in 4 weeks. Stop aspirin on Sunday. Plan for GXT if we convert back to sinus. He is totally asymptomatic at this time. If he fails to convert, rate control with anticoagulation would be undertaken. I think it is reasonable to let him continue driving at this time. He has had no symptoms.   2. HTN - blood pressure looks good.   3. Obesity.  Patient is agreeable to this plan and will call if any problems develop in the interim.   Rosalio Macadamia, RN, ANP-C [facility] [address] [place], [place]  [address]

## 2013-03-22 NOTE — Procedures (Deleted)
Exercise Treadmill Test  Pre-Exercise Testing Evaluation Rhythm Afib Rate: 100     Test  Exercise Tolerance Test Ordering MD: Charlton Haws, MD  Interpreting MD: Norma Fredrickson NP  Unique Test No: 1 Treadmill:  1  Indication for ETT: medication magement  Contraindication to ETT: No   Stress Modality: exercise - treadmill  Cardiac Imaging Performed: non   Protocol: standard Bruce - maximal  Max BP:  ***/***  Max MPHR (bpm):  *** 85% MPR (bpm):  ***  MPHR obtained (bpm):  *** % MPHR obtained:  ***  Reached 85% MPHR (min:sec):  *** Total Exercise Time (min-sec):  ***  Workload in METS:  *** Borg Scale: ***  Reason ETT Terminated:  {CHL REASON TERMINATED FOR WUJ:81191478}    ST Segment Analysis At Rest: {CHL ST SEGMENT AT REST FOR GNF:62130865} With Exercise: {CHL ST SEGMENT WITH EXERCISE FOR HQI:69629528}  Other Information Arrhythmia:  {CHL ARRHYTHMIA FOR UXL:24401027} Angina during ETT:  {CHL ANGINA DURING OZD:66440347} Quality of ETT:  {CHL QUALITY OF QQV:95638756}  ETT Interpretation:  {CHL INTERPRETATION FOR EPP:29518841}  Comments: ***  Recommendations: ***

## 2013-04-15 ENCOUNTER — Ambulatory Visit (HOSPITAL_COMMUNITY): Payer: BC Managed Care – PPO | Attending: Internal Medicine

## 2013-04-15 ENCOUNTER — Ambulatory Visit
Admission: RE | Admit: 2013-04-15 | Discharge: 2013-04-15 | Disposition: A | Discharge status: Home / Self Care | Payer: BC Managed Care – PPO | Source: Ambulatory Visit | Attending: Nurse Practitioner | Admitting: Nurse Practitioner

## 2013-04-15 ENCOUNTER — Encounter: Payer: Self-pay | Admitting: Nurse Practitioner

## 2013-04-15 ENCOUNTER — Ambulatory Visit (INDEPENDENT_AMBULATORY_CARE_PROVIDER_SITE_OTHER): Payer: BC Managed Care – PPO | Admitting: Nurse Practitioner

## 2013-04-15 VITALS — BP 138/70 | HR 102 | Ht 70.0 in | Wt 238.4 lb

## 2013-04-15 DIAGNOSIS — I4891 Unspecified atrial fibrillation: Secondary | ICD-10-CM | POA: Insufficient documentation

## 2013-04-15 DIAGNOSIS — Z79899 Other long term (current) drug therapy: Secondary | ICD-10-CM

## 2013-04-15 DIAGNOSIS — Z87891 Personal history of nicotine dependence: Secondary | ICD-10-CM | POA: Insufficient documentation

## 2013-04-15 DIAGNOSIS — E669 Obesity, unspecified: Secondary | ICD-10-CM | POA: Insufficient documentation

## 2013-04-15 DIAGNOSIS — R609 Edema, unspecified: Secondary | ICD-10-CM | POA: Insufficient documentation

## 2013-04-15 DIAGNOSIS — I10 Essential (primary) hypertension: Secondary | ICD-10-CM | POA: Insufficient documentation

## 2013-04-15 NOTE — Progress Notes (Addendum)
 [name] Date of Birth: 03/25/1956 Medical Record #[id number]  History of Present Illness: [name] is seen back today for a follow up visit. Seen for Dr. Nishan. He has had PAF on chronic Flecainide and prior aspirin. Other issues include HTN and obesity.   Seen back in June - was in atrial fib - had an event monitor. Was referred for GXT but remained in atrial fib. I started him on Xarelto with plans for possible cardioversion.   Comes back today. Here alone. Doing fine. No chest pain. Not short of breath. No palpitations. Taking his Xarelto - no missed doses. Has had his echo earlier this morning - results pending.   Current Outpatient Prescriptions  Medication Sig Dispense Refill  . flecainide (TAMBOCOR) 100 MG tablet Take 1 tablet (100 mg total) by mouth 2 (two) times daily.  180 tablet  6  . metoprolol tartrate (LOPRESSOR) 25 MG tablet Take 1 tablet (25 mg total) by mouth 2 (two) times daily.  180 tablet  6  . Rivaroxaban (XARELTO) 20 MG TABS Take 1 tablet (20 mg total) by mouth daily.  30 tablet  6   No current facility-administered medications for this visit.    No Known Allergies  Past Medical History  Diagnosis Date  . HTN (hypertension)   . Paroxysmal atrial fibrillation   . Obesity   . Edema   . High risk medication use     on Flecaindie    Past Surgical History  Procedure Laterality Date  . Lower back surgery      herniated nucleus pulposus left L5-S1; semihemilaminectomy and diskectomy with microdissection with microscope  . Right cornea repair      secondary to injury from a bungee cord  . Tonsillectomy      History  Smoking status  . Former Smoker  Smokeless tobacco  . Not on file    History  Alcohol Use No    No family history on file.  Review of Systems: The review of systems is per the HPI.  All other systems were reviewed and are negative.  Physical Exam: BP 138/70  Pulse 102  Ht 5' 10" (1.778 m)  Wt 238 lb 6.4 oz  (108.138 kg)  BMI 34.21 kg/m2 Patient is very pleasant and in no acute distress. Skin is warm and dry. Color is normal.  HEENT is unremarkable. Normocephalic/atraumatic. PERRL. Sclera are nonicteric. Neck is supple. No masses. No JVD. Lungs are clear. Cardiac exam shows an irregular rhythm. Abdomen is soft. Extremities are without edema. Gait and ROM are intact. No gross neurologic deficits noted.  LABORATORY DATA:  Echo pending  EKG today shows atrial fib - rate of 102    Chemistry      Component Value Date/Time   NA 140 03/22/2013 1256   K 4.1 03/22/2013 1256   CL 106 03/22/2013 1256   CO2 28 03/22/2013 1256   BUN 13 03/22/2013 1256   CREATININE 0.9 03/22/2013 1256      Component Value Date/Time   CALCIUM 8.9 03/22/2013 1256   ALKPHOS 89 02/13/2012 1120   AST 27 02/13/2012 1120   ALT 29 02/13/2012 1120   BILITOT 0.4 02/13/2012 1120     Lab Results  Component Value Date   WBC 7.7 03/22/2013   HGB 13.5 03/22/2013   HCT 40.6 03/22/2013   MCV 91.1 03/22/2013   PLT 132.0* 03/22/2013    Assessment / Plan:  Atrial fib - on Xarelto since July 18th. He is   interested in cardioversion - but not until after Labor Day - I have arranged for cardioversion on Wednesday, September 3rd. Will check labs the day before. Send for CXR today. Echo results pending. If cardioversion proves to be successful, will need to come back for GXT. If not successful would consider stopping the Flecainide and leave him on long term anticoagulation. He remains basically asymptomatic. The procedure has been reviewed in detail with him and he is willing to proceed.   Patient is agreeable to this plan and will call if any problems develop in the interim.   Jason Rohrbach C. Anamika Kueker, RN, ANP-C [facility] [address] [place], [place]  [address]   Addendum: Echo results just noted. Normal LV function, mild LAE with moderate MR.   Study Conclusions  - Left ventricle: The cavity size was normal. Wall  thickness was normal. Systolic function was normal. The estimated ejection fraction was in the range of 55% to 60%. - Mitral valve: Moderate regurgitation. - Left atrium: The atrium was mildly dilated. - Pulmonary arteries: PA peak pressure: 34mm Hg (S).   

## 2013-04-15 NOTE — Patient Instructions (Addendum)
We will check labs on Tuesday, September 2nd here at this office  Go to [facility] for a CXR today at [facility] - first floor - you can walk in  We will set up your cardioversion for Wednesday September 3rd  We will call you with your ultrasound results   You are scheduled for a cardioversion on Wednesday, September 3rd at  1:00 pm with Dr. Elease Hashimoto or associates. Please go to [facility] 2nd Floor Short Stay at Wednesday, September 3rd at 11:30 am.  Enter through the [employer] A Do not have any food or drink after midnight on Tuesday.  You may take your medicines with a sip of water on the day of your procedure.  You will need someone to drive you home following your procedure.   Electrical Cardioversion Cardioversion is the delivery of a jolt of electricity to change the rhythm of the heart. Sticky patches or metal paddles are placed on the chest to deliver the electricity from a special device. This is done to restore a normal rhythm. A rhythm that is too fast or not regular keeps the heart from pumping well. Compared to medicines used to change an abnormal rhythm, cardioversion is faster and works better. It is also unpleasant and may dislodge blood clots from the heart. WHEN WOULD THIS BE DONE?  In an emergency:  There is low or no blood pressure as a result of the heart rhythm.  Normal rhythm must be restored as fast as possible to protect the brain and heart from further damage.  It may save a life.  For less serious heart rhythms, such as atrial fibrillation or flutter, in which:  The heart is beating too fast or is not regular.  The heart is still able to pump enough blood, but not as well as it should.  Medicine to change the rhythm has not worked.  It is safe to wait in order to allow time for preparation. LET YOUR CAREGIVER KNOW ABOUT:   Every medicine you are taking. It is very important to do this! Know when to take or stop taking any of  them.  Any time in the past that you have felt your heart was not beating normally. RISKS AND COMPLICATIONS   Clots may form in the chambers of the heart if it is beating too fast. These clots may be dislodged during the procedure and travel to other parts of the body.  There is risk of a stroke during and after the procedure if a clot moves. Blood thinners lower this risk.  You may have a special test of your heart (TEE) to make sure there are no clots in your heart. BEFORE THE PROCEDURE   You may have some tests to see how well your heart is working.  You may start taking blood thinners so your blood does not clot as easily.  Other drugs may be given to help your heart work better. PROCEDURE (SCHEDULED)  The procedure is typically done in a hospital by a heart doctor (cardiologist).  You will be told when and where to go.  You may be given some medicine through an intravenous (IV) access to reduce discomfort and make you sleepy before the procedure.  Your whole body may move when the shock is delivered. Your chest may feel sore.  You may be able to go home after a few hours. Your heart rhythm will be watched to make sure it does not change. HOME CARE INSTRUCTIONS  Only take medicine as directed by your caregiver. Be sure you understand how and when to take your medicine.  Learn how to feel your pulse and check it often.  Limit your activity for 48 hours.  Avoid caffeine and other stimulants as directed. SEEK MEDICAL CARE IF:   You feel like your heart is beating too fast or your pulse is not regular.  You have any questions about your medicines.  You have bleeding that will not stop. SEEK IMMEDIATE MEDICAL CARE IF:   You are dizzy or feel faint.  It is hard to breathe or you feel short of breath.  There is a change in discomfort in your chest.  Your speech is slurred or you have trouble moving your arm or leg on one side.  You get a muscle cramp.  Your  fingers or toes turn cold or blue. MAKE SURE YOU:   Understand these instructions.  Will watch your condition.  Will get help right away if you are not doing well or get worse. Document Released: 08/12/2002 Document Revised: 11/14/2011 Document Reviewed: 12/12/2007 [facility] Patient Information 2014 [place], [place].

## 2013-04-15 NOTE — Progress Notes (Signed)
Echocardiogram performed.  

## 2013-05-01 ENCOUNTER — Encounter (HOSPITAL_COMMUNITY): Payer: Self-pay | Admitting: Pharmacy Technician

## 2013-05-07 ENCOUNTER — Other Ambulatory Visit (INDEPENDENT_AMBULATORY_CARE_PROVIDER_SITE_OTHER): Payer: BC Managed Care – PPO

## 2013-05-07 DIAGNOSIS — I4891 Unspecified atrial fibrillation: Secondary | ICD-10-CM

## 2013-05-07 LAB — CBC WITH DIFFERENTIAL/PLATELET
Basophils Absolute: 0 10*3/uL (ref 0.0–0.1)
Basophils Relative: 0.4 % (ref 0.0–3.0)
Eosinophils Absolute: 0.2 10*3/uL (ref 0.0–0.7)
Eosinophils Relative: 1.9 % (ref 0.0–5.0)
HCT: 41.4 % (ref 39.0–52.0)
Hemoglobin: 13.8 g/dL (ref 13.0–17.0)
Lymphocytes Relative: 20.6 % (ref 12.0–46.0)
Lymphs Abs: 1.7 10*3/uL (ref 0.7–4.0)
MCHC: 33.3 g/dL (ref 30.0–36.0)
MCV: 89.3 fl (ref 78.0–100.0)
Monocytes Absolute: 1.1 10*3/uL — ABNORMAL HIGH (ref 0.1–1.0)
Monocytes Relative: 13.2 % — ABNORMAL HIGH (ref 3.0–12.0)
Neutro Abs: 5.4 10*3/uL (ref 1.4–7.7)
Neutrophils Relative %: 63.9 % (ref 43.0–77.0)
Platelets: 128 10*3/uL — ABNORMAL LOW (ref 150.0–400.0)
RBC: 4.64 Mil/uL (ref 4.22–5.81)
RDW: 13.5 % (ref 11.5–14.6)
WBC: 8.4 10*3/uL (ref 4.5–10.5)

## 2013-05-07 LAB — BASIC METABOLIC PANEL
BUN: 18 mg/dL (ref 6–23)
CO2: 25 mEq/L (ref 19–32)
Calcium: 8.9 mg/dL (ref 8.4–10.5)
Chloride: 105 mEq/L (ref 96–112)
Creatinine, Ser: 0.7 mg/dL (ref 0.4–1.5)
GFR: 116.31 mL/min (ref >60.00)
Glucose, Bld: 78 mg/dL (ref 70–99)
Potassium: 4.3 mEq/L (ref 3.5–5.1)
Sodium: 135 mEq/L (ref 135–145)

## 2013-05-07 LAB — APTT: aPTT: 38.5 s — ABNORMAL HIGH (ref 21.7–28.8)

## 2013-05-07 LAB — PROTIME-INR
INR: 1.7 ratio — ABNORMAL HIGH (ref 0.8–1.0)
Prothrombin Time: 17.8 s — ABNORMAL HIGH (ref 10.2–12.4)

## 2013-05-08 ENCOUNTER — Ambulatory Visit (HOSPITAL_COMMUNITY): Payer: BC Managed Care – PPO | Admitting: Anesthesiology

## 2013-05-08 ENCOUNTER — Encounter (HOSPITAL_COMMUNITY)
Admission: RE | Disposition: A | Discharge status: Home / Self Care | Payer: Self-pay | Source: Ambulatory Visit | Attending: Cardiovascular Disease

## 2013-05-08 ENCOUNTER — Encounter (HOSPITAL_COMMUNITY): Payer: Self-pay | Admitting: *Deleted

## 2013-05-08 ENCOUNTER — Ambulatory Visit (HOSPITAL_COMMUNITY)
Admission: RE | Admit: 2013-05-08 | Discharge: 2013-05-08 | Disposition: A | Discharge status: Home / Self Care | Payer: BC Managed Care – PPO | Source: Ambulatory Visit | Attending: Cardiovascular Disease | Admitting: Cardiovascular Disease

## 2013-05-08 ENCOUNTER — Encounter (HOSPITAL_COMMUNITY): Payer: Self-pay | Admitting: Anesthesiology

## 2013-05-08 DIAGNOSIS — Z79899 Other long term (current) drug therapy: Secondary | ICD-10-CM | POA: Insufficient documentation

## 2013-05-08 DIAGNOSIS — E669 Obesity, unspecified: Secondary | ICD-10-CM | POA: Insufficient documentation

## 2013-05-08 DIAGNOSIS — R609 Edema, unspecified: Secondary | ICD-10-CM | POA: Insufficient documentation

## 2013-05-08 DIAGNOSIS — I059 Rheumatic mitral valve disease, unspecified: Secondary | ICD-10-CM | POA: Insufficient documentation

## 2013-05-08 DIAGNOSIS — I4891 Unspecified atrial fibrillation: Secondary | ICD-10-CM

## 2013-05-08 DIAGNOSIS — Z6834 Body mass index (BMI) 34.0-34.9, adult: Secondary | ICD-10-CM | POA: Insufficient documentation

## 2013-05-08 DIAGNOSIS — Z87891 Personal history of nicotine dependence: Secondary | ICD-10-CM | POA: Insufficient documentation

## 2013-05-08 DIAGNOSIS — Z7901 Long term (current) use of anticoagulants: Secondary | ICD-10-CM | POA: Insufficient documentation

## 2013-05-08 DIAGNOSIS — I517 Cardiomegaly: Secondary | ICD-10-CM | POA: Insufficient documentation

## 2013-05-08 DIAGNOSIS — I10 Essential (primary) hypertension: Secondary | ICD-10-CM | POA: Insufficient documentation

## 2013-05-08 HISTORY — PX: CARDIOVERSION: SHX1299

## 2013-05-08 HISTORY — DX: Personal history of other diseases of the digestive system: Z87.19

## 2013-05-08 SURGERY — CARDIOVERSION
Anesthesia: General | Wound class: Clean

## 2013-05-08 MED ORDER — LIDOCAINE HCL (CARDIAC) 20 MG/ML IV SOLN
INTRAVENOUS | Status: DC | PRN
  Administered 2013-05-08: 60 mg via INTRAVENOUS

## 2013-05-08 MED ORDER — RIVAROXABAN 20 MG PO TABS
20.0000 mg | ORAL_TABLET | ORAL | Status: AC
  Administered 2013-05-08: 20 mg via ORAL
  Filled 2013-05-08: qty 1

## 2013-05-08 MED ORDER — DEXTROSE-NACL 5-0.45 % IV SOLN: INTRAVENOUS | Status: DC

## 2013-05-08 MED ORDER — SODIUM CHLORIDE 0.9 % IV SOLN
INTRAVENOUS | Status: DC
  Administered 2013-05-08: 12:00:00 via INTRAVENOUS

## 2013-05-08 MED ORDER — PROPOFOL 10 MG/ML IV BOLUS
INTRAVENOUS | Status: DC | PRN
  Administered 2013-05-08: 100 mg via INTRAVENOUS
  Administered 2013-05-08: 80 mg via INTRAVENOUS

## 2013-05-08 NOTE — CV Procedure (Signed)
    Cardioversion Note  DAWIT TANKARD [id number] 09/29/1955  Procedure: DC Cardioversion Indications: atrial fibrillation  Procedure Details Consent: Obtained Time Out: Verified patient identification, verified procedure, site/side was marked, verified correct patient position, special equipment/implants available, Radiology Safety Procedures followed,  medications/allergies/relevent history reviewed, required imaging and test results available.  Performed  The patient has been on adequate anticoagulation.  The patient received IV Lidocaine 60 mg IV followed by Propofol 80 mg (1st cardioversion attempt)  followed by Propofol 100 mg IV for sedation (2nd and 3rd cardioversion attempts).  Synchronous cardioversion was performed at 120 jouls, 200 J, 200  joules.  The cardioversion was unsuccessful.    Complications: No apparent complications Patient did tolerate procedure well.   Vesta Mixer, Montez Hageman., MD, [facility] 05/08/2013, 1:10 PM

## 2013-05-08 NOTE — Preoperative (Signed)
Beta Blockers   Reason not to administer Beta Blockers:Not Applicable 

## 2013-05-08 NOTE — H&P (View-Only) (Signed)
[name] Date of Birth: 1956/05/20 Medical Record #[id number]  History of Present Illness: [name] is seen back today for a follow up visit. Seen for Dr. Eden Emms. He has had PAF on chronic Flecainide and prior aspirin. Other issues include HTN and obesity.   Seen back in June - was in atrial fib - had an event monitor. Was referred for GXT but remained in atrial fib. I started him on Xarelto with plans for possible cardioversion.   Comes back today. Here alone. Doing fine. No chest pain. Not short of breath. No palpitations. Taking his Xarelto - no missed doses. Has had his echo earlier this morning - results pending.   Current Outpatient Prescriptions  Medication Sig Dispense Refill  . flecainide (TAMBOCOR) 100 MG tablet Take 1 tablet (100 mg total) by mouth 2 (two) times daily.  180 tablet  6  . metoprolol tartrate (LOPRESSOR) 25 MG tablet Take 1 tablet (25 mg total) by mouth 2 (two) times daily.  180 tablet  6  . Rivaroxaban (XARELTO) 20 MG TABS Take 1 tablet (20 mg total) by mouth daily.  30 tablet  6   No current facility-administered medications for this visit.    No Known Allergies  Past Medical History  Diagnosis Date  . HTN (hypertension)   . Paroxysmal atrial fibrillation   . Obesity   . Edema   . High risk medication use     on Flecaindie    Past Surgical History  Procedure Laterality Date  . Lower back surgery      herniated nucleus pulposus left L5-S1; semihemilaminectomy and diskectomy with microdissection with microscope  . Right cornea repair      secondary to injury from a bungee cord  . Tonsillectomy      History  Smoking status  . Former Smoker  Smokeless tobacco  . Not on file    History  Alcohol Use No    No family history on file.  Review of Systems: The review of systems is per the HPI.  All other systems were reviewed and are negative.  Physical Exam: BP 138/70  Pulse 102  Ht 5\' 10"  (1.778 m)  Wt 238 lb 6.4 oz  (108.138 kg)  BMI 34.21 kg/m2 Patient is very pleasant and in no acute distress. Skin is warm and dry. Color is normal.  HEENT is unremarkable. Normocephalic/atraumatic. PERRL. Sclera are nonicteric. Neck is supple. No masses. No JVD. Lungs are clear. Cardiac exam shows an irregular rhythm. Abdomen is soft. Extremities are without edema. Gait and ROM are intact. No gross neurologic deficits noted.  LABORATORY DATA:  Echo pending  EKG today shows atrial fib - rate of 102    Chemistry      Component Value Date/Time   NA 140 03/22/2013 1256   K 4.1 03/22/2013 1256   CL 106 03/22/2013 1256   CO2 28 03/22/2013 1256   BUN 13 03/22/2013 1256   CREATININE 0.9 03/22/2013 1256      Component Value Date/Time   CALCIUM 8.9 03/22/2013 1256   ALKPHOS 89 02/13/2012 1120   AST 27 02/13/2012 1120   ALT 29 02/13/2012 1120   BILITOT 0.4 02/13/2012 1120     Lab Results  Component Value Date   WBC 7.7 03/22/2013   HGB 13.5 03/22/2013   HCT 40.6 03/22/2013   MCV 91.1 03/22/2013   PLT 132.0* 03/22/2013    Assessment / Plan:  Atrial fib - on Xarelto since July 18th. He is  interested in cardioversion - but not until after Labor Day - I have arranged for cardioversion on Wednesday, September 3rd. Will check labs the day before. Send for CXR today. Echo results pending. If cardioversion proves to be successful, will need to come back for GXT. If not successful would consider stopping the Flecainide and leave him on long term anticoagulation. He remains basically asymptomatic. The procedure has been reviewed in detail with him and he is willing to proceed.   Patient is agreeable to this plan and will call if any problems develop in the interim.   Rosalio Macadamia, RN, ANP-C [facility] [address] [place], [place]  [address]   Addendum: Echo results just noted. Normal LV function, mild LAE with moderate MR.   Study Conclusions  - Left ventricle: The cavity size was normal. Wall  thickness was normal. Systolic function was normal. The estimated ejection fraction was in the range of 55% to 60%. - Mitral valve: Moderate regurgitation. - Left atrium: The atrium was mildly dilated. - Pulmonary arteries: PA peak pressure: 34mm Hg (S).

## 2013-05-08 NOTE — Transfer of Care (Signed)
Immediate Anesthesia Transfer of Care Note  Patient: [name]  Procedure(s) Performed: Procedure(s): CARDIOVERSION (N/A)  Patient Location: Endoscopy Unit  Anesthesia Type:General  Level of Consciousness: awake, alert  and oriented  Airway & Oxygen Therapy: Patient Spontanous Breathing  Post-op Assessment: Report given to PACU RN, Post -op Vital signs reviewed and stable and Patient moving all extremities X 4  Post vital signs: Reviewed and stable  Complications: No apparent anesthesia complications

## 2013-05-08 NOTE — Anesthesia Postprocedure Evaluation (Signed)
  Anesthesia Post-op Note  Patient: [name]  Procedure(s) Performed: Procedure(s): CARDIOVERSION (N/A)  Patient Location: Endoscopy Unit  Anesthesia Type:General  Level of Consciousness: awake and alert   Airway and Oxygen Therapy: Patient Spontanous Breathing  Post-op Pain: none  Post-op Assessment: Post-op Vital signs reviewed, Patient's Cardiovascular Status Stable, Respiratory Function Stable, Patent Airway and No signs of Nausea or vomiting  Post-op Vital Signs: Reviewed and stable  Complications: No apparent anesthesia complications

## 2013-05-08 NOTE — Anesthesia Preprocedure Evaluation (Addendum)
Anesthesia Evaluation  Patient identified by MRN, date of birth, ID band Patient awake    Reviewed: Allergy & Precautions, H&P , NPO status , Patient's Chart, lab work & pertinent test results, reviewed documented beta blocker date and time   Airway Mallampati: II TM Distance: >3 FB Neck ROM: Full    Dental no notable dental hx. (+) Upper Dentures and Dental Advisory Given   Pulmonary neg pulmonary ROS,    Pulmonary exam normal       Cardiovascular hypertension, On Medications and On Home Beta Blockers + dysrhythmias Atrial Fibrillation     Neuro/Psych negative neurological ROS  negative psych ROS   GI/Hepatic negative GI ROS, Neg liver ROS,   Endo/Other  negative endocrine ROS  Renal/GU negative Renal ROS  negative genitourinary   Musculoskeletal   Abdominal   Peds  Hematology negative hematology ROS (+)   Anesthesia Other Findings   Reproductive/Obstetrics negative OB ROS                          Anesthesia Physical Anesthesia Plan  ASA: III  Anesthesia Plan: General   Post-op Pain Management:    Induction: Intravenous  Airway Management Planned: Mask  Additional Equipment:   Intra-op Plan:   Post-operative Plan:   Informed Consent: I have reviewed the patients History and Physical, chart, labs and discussed the procedure including the risks, benefits and alternatives for the proposed anesthesia with the patient or authorized representative who has indicated his/her understanding and acceptance.   Dental advisory given  Plan Discussed with: CRNA  Anesthesia Plan Comments:         Anesthesia Quick Evaluation

## 2013-05-08 NOTE — Interval H&P Note (Signed)
History and Physical Interval Note:  05/08/2013 1:05 PM  [name]  has presented today for surgery, with the diagnosis of A FIB   The various methods of treatment have been discussed with the patient and family. After consideration of risks, benefits and other options for treatment, the patient has consented to  Procedure(s): CARDIOVERSION (N/A) as a surgical intervention .  The patient's history has been reviewed, patient examined, no change in status, stable for surgery.  I have reviewed the patient's chart and labs.  Questions were answered to the patient's satisfaction.     Elyn Aquas.

## 2013-05-09 ENCOUNTER — Encounter (HOSPITAL_COMMUNITY): Payer: Self-pay | Admitting: Cardiovascular Disease

## 2013-05-13 ENCOUNTER — Encounter: Payer: Self-pay | Admitting: Cardiovascular Disease

## 2013-05-13 ENCOUNTER — Ambulatory Visit (INDEPENDENT_AMBULATORY_CARE_PROVIDER_SITE_OTHER): Payer: BC Managed Care – PPO | Admitting: Cardiovascular Disease

## 2013-05-13 VITALS — BP 124/68 | HR 74 | Wt 245.0 lb

## 2013-05-13 DIAGNOSIS — I4891 Unspecified atrial fibrillation: Secondary | ICD-10-CM

## 2013-05-13 DIAGNOSIS — I10 Essential (primary) hypertension: Secondary | ICD-10-CM

## 2013-05-13 DIAGNOSIS — Z7901 Long term (current) use of anticoagulants: Secondary | ICD-10-CM | POA: Insufficient documentation

## 2013-05-13 NOTE — Progress Notes (Signed)
Patient ID: [name], male   DOB: 11/29/1955, 57 y.o.   MRN: [id number] 57 yo with afib. Seen by PA and set up for cardioversion.  Tried by Dr Elease Hashimoto 9/3 without success.  ( DCC 120 200,200)  On flecainide Echo with mild LAE and normal EF Currently anticoagulated with xarelto  Normal ETT on 7/14 with no history of CAD  Discussed possible Ablation and he is willing to see Dr Johney Frame Will stop flecainide sine it was not effective  ROS: Denies fever, malais, weight loss, blurry vision, decreased visual acuity, cough, sputum, SOB, hemoptysis, pleuritic pain, palpitaitons, heartburn, abdominal pain, melena, lower extremity edema, claudication, or rash.  All other systems reviewed and negative  General: Affect appropriate Healthy:  appears stated age HEENT: normal Neck supple with no adenopathy JVP normal no bruits no thyromegaly Lungs clear with no wheezing and good diaphragmatic motion Heart:  S1/S2 no murmur, no rub, gallop or click PMI normal Abdomen: benighn, BS positve, no tenderness, no AAA no bruit.  No HSM or HJR Distal pulses intact with no bruits No edema Neuro non-focal Skin warm and dry No muscular weakness   Current Outpatient Prescriptions  Medication Sig Dispense Refill  . flecainide (TAMBOCOR) 100 MG tablet Take 1 tablet (100 mg total) by mouth 2 (two) times daily.  180 tablet  6  . metoprolol tartrate (LOPRESSOR) 25 MG tablet Take 1 tablet (25 mg total) by mouth 2 (two) times daily.  180 tablet  6  . Rivaroxaban (XARELTO) 20 MG TABS Take 1 tablet (20 mg total) by mouth daily.  30 tablet  6   No current facility-administered medications for this visit.    Allergies  Review of patient's allergies indicates no known allergies.  Electrocardiogram:  8/11 Afib rate 102  Otherwise normal   Assessment and Plan

## 2013-05-13 NOTE — Assessment & Plan Note (Signed)
Well controlled.  Continue current medications and low sodium Dash type diet.    

## 2013-05-13 NOTE — Patient Instructions (Addendum)
You have been referred to DR ALLRED  ? ABLATION  DR Jenel Lucks SCHEDULER WILL CALL WITH  NEXT AVAILABLE   Your physician wants you to follow-up in:   6 MONTHS WITH DR  Haywood Filler will receive a reminder letter in the mail two months in advance. If you don't receive a letter, please call our office to schedule the follow-up appointment. Your physician has recommended you make the following change in your medication: STOP  FLECAINIDE

## 2013-05-13 NOTE — Assessment & Plan Note (Signed)
Persistant despite flecainide Failed [facility] 9/3  F/U Allred to consider ablation

## 2013-05-13 NOTE — Assessment & Plan Note (Signed)
Continue xarelto No bleeding issues

## 2013-06-03 ENCOUNTER — Encounter: Payer: Self-pay | Admitting: Internal Medicine

## 2013-06-03 ENCOUNTER — Ambulatory Visit (INDEPENDENT_AMBULATORY_CARE_PROVIDER_SITE_OTHER): Payer: BC Managed Care – PPO | Admitting: Internal Medicine

## 2013-06-03 VITALS — BP 128/62 | HR 62 | Ht 70.0 in | Wt 242.5 lb

## 2013-06-03 DIAGNOSIS — I059 Rheumatic mitral valve disease, unspecified: Secondary | ICD-10-CM

## 2013-06-03 DIAGNOSIS — I340 Nonrheumatic mitral (valve) insufficiency: Secondary | ICD-10-CM | POA: Insufficient documentation

## 2013-06-03 NOTE — Progress Notes (Addendum)
Primary Care Physician: none Referring Physician:  Dr [name] [name] is a 57 y.o. male with a h/o persistent afib who presents today for EP consultation.  He reports initially being diagnosed with atrial fibrillation in 2009 after presenting for preoperative ekg prior to back surgery.  He was unaware of his afib at the time.  He was treated with metoprolol and flecainide.  He reports that he could not tell any difference when in afib or in sinus.  He wore a 30 day monitor recently which revealed that he was in afib the entire time.  He underwent cardioversion 05/13/13 which was unsuccessful.  Flecainide was discontinued at that time.  An echo was performed 04/15/13 which revealed LVEF 55-60% with LVEDD 54.59mm, LA size 41mm and moderate MR.  In 2009, MR was trivial and LVEDD at that time was 46.6   He is anticoagulated with xarelto presently. He reports that he is active. Typically he can do what he wants without limitation.  He feels that his energy is preserved.  He has occasional SOB. Today, he denies symptoms of palpitations, chest pain, shortness of breath, orthopnea, PND, lower extremity edema, dizziness, presyncope, syncope, or neurologic sequela. The patient is tolerating medications without difficulties and is otherwise without complaint today.   Past Medical History  Diagnosis Date  . HTN (hypertension)     pt unaware of this  . Persistent atrial fibrillation   . Obesity   . H/O hiatal hernia   . Moderate mitral regurgitation    Past Surgical History  Procedure Laterality Date  . Lower back surgery      herniated nucleus pulposus left L5-S1; semihemilaminectomy and diskectomy with microdissection with microscope  . Right cornea repair      secondary to injury from a bungee cord  . Tonsillectomy    . Cardioversion N/A 05/08/2013    unsuccessful for persistent afib    Current Outpatient Prescriptions  Medication Sig Dispense Refill  . metoprolol tartrate (LOPRESSOR) 25  MG tablet Take 1 tablet (25 mg total) by mouth 2 (two) times daily.  180 tablet  6  . Rivaroxaban (XARELTO) 20 MG TABS Take 1 tablet (20 mg total) by mouth daily.  30 tablet  6   No current facility-administered medications for this visit.    No Known Allergies  History   Social History  . Marital Status: Married    Spouse Name: N/A    Number of Children: N/A  . 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 Smoking status: Former Games developer  . Smokeless tobacco: Former Neurosurgeon    Quit date: 05/08/2002  . Alcohol Use: No  . Drug Use: No  . Sexual Activity: Not Currently   Other Topics Concern  . 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  Truck driver    Family History  Problem Relation Age of Onset  . Diabetes Brother     ROS- All systems are reviewed and negative except as per the HPI above  Physical Exam: Filed Vitals:   06/03/13 1037  BP: 128/62  Pulse: 62  Height: 5\' 10"  (1.778 m)  Weight: 242 lb 8 oz (109.997 kg)    GEN- The patient is well appearing, alert and oriented x 3 today.   Head- normocephalic, atraumatic Eyes-  Sclera clear, conjunctiva pink Ears- hearing intact Oropharynx- clear Neck- supple,  no JVP Lymph- no cervical lymphadenopathy Lungs- Clear to ausculation bilaterally, normal work of breathing Heart- irregular rate and rhythm, no murmurs, rubs or gallops, PMI not laterally displaced GI- soft, NT, ND, + BS Extremities- no clubbing, cyanosis, or edema MS- no significant deformity or atrophy Skin- no rash or lesion Psych- euthymic mood, full affect Neuro- strength and sensation are intact  EKG 04/15/13 revealed afib, V rate 104, QTc 432  Assessment and Plan:  1. Persistent atrial fibrillation The patient presets for EP consultation regarding his persistent afib.  He reports that he is minimally symptomatic with his afib.  His CHADS2VASC score is at most 1 (He  denies HTN).  He therefore does not require long term anticoagulation at this time.  Therapeutic strategies for afib including rate control and rhythm control were discussed in detail with the patient today. Risk, benefits, and alternatives to tikosyn as well as EP study and radiofrequency ablation for afib were discussed in detail today.  His anticipated success rate from ablation would be at about 65% with 1/3 change  Of requiring multiple procedures.  The indication for ablation is for symptoms according to HRS guidelines, and he denies significant symptoms at this time.  I therefore have encouraged him to continue on a rate control strategy at this time.  He will stop xarelto and continue metoprolol for rate control.   2. Moderate MR The patients recent echo reveals moderate MR.  This is new when compared to 2009 and has is accompanied by significant LV enlargement (LVEDD 46-->54.8).  I suspect that his MR may be more significant than we know and may be partially reponsible for his progressive afib.  I would recommend that he have very close monitoring of his MR.   Should he develop any progression in his MR, then he may benefit from surgical evaluation by Dr [name] and colleagues.  He could have a MAZE at the time of his mitral repair should that be the case.  He will follow closely with Dr [name] and I will see as needed going forward.

## 2013-06-03 NOTE — Patient Instructions (Signed)
Your physician recommends that you schedule a follow-up appointment as scheduled with Dr Eden Emms and Dr Johney Frame as needed  Your physician has recommended you make the following change in your medication:  1) Stop Xarelto after you finish what you have

## 2013-07-03 ENCOUNTER — Other Ambulatory Visit: Payer: Self-pay | Admitting: *Deleted

## 2013-07-03 MED ORDER — METOPROLOL TARTRATE 25 MG PO TABS: 25.0000 mg | ORAL_TABLET | Freq: Two times a day (BID) | ORAL | Status: DC

## 2014-02-10 ENCOUNTER — Telehealth: Payer: Self-pay | Admitting: Cardiovascular Disease

## 2014-02-10 ENCOUNTER — Encounter: Payer: Self-pay | Admitting: *Deleted

## 2014-02-10 NOTE — Telephone Encounter (Signed)
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

## 2014-02-10 NOTE — Telephone Encounter (Signed)
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

## 2014-02-10 NOTE — Telephone Encounter (Signed)
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  1. Need a current list of medication.   2. Fax [phone]

## 2014-02-10 NOTE — Telephone Encounter (Signed)
LETTER  FAXED  AT  P T[facility] REQUEST  PT   NOTIFIED./CY

## 2014-02-10 NOTE — Telephone Encounter (Signed)
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

## 2014-03-17 ENCOUNTER — Ambulatory Visit: Payer: BC Managed Care – PPO | Admitting: Cardiovascular Disease

## 2014-05-29 ENCOUNTER — Telehealth: Payer: Self-pay | Admitting: Cardiovascular Disease

## 2014-05-29 ENCOUNTER — Ambulatory Visit (INDEPENDENT_AMBULATORY_CARE_PROVIDER_SITE_OTHER): Payer: BC Managed Care – PPO | Admitting: Family Medicine

## 2014-05-29 ENCOUNTER — Ambulatory Visit (INDEPENDENT_AMBULATORY_CARE_PROVIDER_SITE_OTHER): Payer: BC Managed Care – PPO

## 2014-05-29 ENCOUNTER — Other Ambulatory Visit: Payer: Self-pay | Admitting: Family Medicine

## 2014-05-29 VITALS — BP 136/64 | HR 120 | Temp 97.2°F | Resp 24 | Wt 236.0 lb

## 2014-05-29 DIAGNOSIS — R233 Spontaneous ecchymoses: Secondary | ICD-10-CM

## 2014-05-29 DIAGNOSIS — I4891 Unspecified atrial fibrillation: Secondary | ICD-10-CM

## 2014-05-29 DIAGNOSIS — D696 Thrombocytopenia, unspecified: Secondary | ICD-10-CM

## 2014-05-29 DIAGNOSIS — R0602 Shortness of breath: Secondary | ICD-10-CM

## 2014-05-29 DIAGNOSIS — R238 Other skin changes: Secondary | ICD-10-CM

## 2014-05-29 DIAGNOSIS — D649 Anemia, unspecified: Secondary | ICD-10-CM

## 2014-05-29 LAB — POCT CBC
Granulocyte percent: 80.8 %G — AB (ref 37–80)
HCT, POC: 39.5 % — AB (ref 43.5–53.7)
HEMOGLOBIN: 12.5 g/dL — AB (ref 14.1–18.1)
Lymph, poc: 1 (ref 0.6–3.4)
MCH, POC: 29.5 pg (ref 27–31.2)
MCHC: 31.6 g/dL — AB (ref 31.8–35.4)
MCV: 93.5 fL (ref 80–97)
MID (CBC): 0.2 (ref 0–0.9)
MPV: 7.8 fL (ref 0–99.8)
PLATELET COUNT, POC: 100 10*3/uL — AB (ref 142–424)
POC GRANULOCYTE: 5 (ref 2–6.9)
POC LYMPH PERCENT: 16.1 %L (ref 10–50)
POC MID %: 3.1 % (ref 0–12)
RBC: 4.23 M/uL — AB (ref 4.69–6.13)
RDW, POC: 15.3 %
WBC: 6.2 10*3/uL (ref 4.6–10.2)

## 2014-05-29 LAB — GLUCOSE, POCT (MANUAL RESULT ENTRY): POC GLUCOSE: 114 mg/dL — AB (ref 70–99)

## 2014-05-29 NOTE — Telephone Encounter (Signed)
Pt's wife called because she is bringing pt to [facility] now for an appointment with Dr. Johnsie Cancel tomorrow. Pt is SOB now and she would like to know if pt can be seen this afternoon in this office. Wife is aware that is too late to accommodate pt at this time. Wife states " I will take him to the urgent care" and he will Keep the appointment for tomorrow at 8:45 AM with Dr. Johnsie Cancel.

## 2014-05-29 NOTE — Patient Instructions (Signed)
1.  Take another Metoprolol at bedtime; take evening dose of Metoprolol now.   2.  Recommend undergoing colonoscopy in upcoming six months.   3.  Recommend undergoing complete physical examination in upcoming six months. 4. Present to emergency department if your shortness of breath worsens over night; present to emergency department if you develop chest pain.

## 2014-05-29 NOTE — Telephone Encounter (Signed)
NOTED ./CY 

## 2014-05-29 NOTE — Progress Notes (Addendum)
Subjective:  This chart was scribed for Jason Forts, MD by [name], Medial Scribe. This patient was seen in room 6 and the patient's care was started at 4:35 PM.   Patient ID: [name], male    DOB: 1956-04-13, 58 y.o.   MRN: [id number]  05/29/2014  Shortness of Breath, Fatigue and Shaking  HPI HPI Comments:  [name] is a 58 y.o. male who presents to the [facility] complaining of shortness of breath, fatigue and shaking, onset was late last night/early this morning. Patient with history of atrial fibrillation since 2009; status post cardioversion one year ago with persistent atrial fibrillation. Dx afib was 2009 when undergoing clearance for back surgery. Last echo was 04/2013 and his EF was 55% and moderate mitral regurgitation.   Patient reports waking up this morning, walking down the stairs and experiencing difficulty breathing when reaching the bottom. Patient has appointment with cardiology tomorrow morning at 8:45am.  Wife concerned about other etiologies to SOB so brought pt in for evaluation.  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  Patient denies shortness of breath at rest or with lying down, unusual swelling, chest pain, palpitations, fever, chills, sweats, sore throat, ear pain, nausea, vomiting, diarrhea, changes in appetite. He reports mild cough with associated rhinorrhea, onset two days ago.  Patient denies associated back pain, neck or arm pain, or diaphoresis with his shortness of breath.   Patient denies similar symptoms with previous atrial fibrillation.   Patient is a Administrator. He reports smoking cessation 11 years ago.  Patient denies recent long travel.   R leg has been larger than L leg for years; no recent worsening swelling or enlargement; no calf pain.  Wife also reports diarrhea/frequent stools which is new for the patient.  Pt also gets shaky a lot which is relatively new as well.  Pt also has been  bruising easily for the past few years; skin is paper thin.  No other sites of bleeding;no bloody stools, epistaxis, hematuria, gums bleeding easily with brushing teeth.  Patient reports compliance with Metoprolol bid; does not take an ASA daily.  Pt does not have PCP; no previous colonoscopy.   Review of Systems  Constitutional: Negative for fever, chills, diaphoresis, appetite change and fatigue.  HENT: Positive for congestion. Negative for ear pain, postnasal drip, rhinorrhea, sinus pressure and sore throat.   Respiratory: Positive for cough and shortness of breath. Negative for choking, wheezing and stridor.   Cardiovascular: Negative for chest pain, palpitations and leg swelling.  Gastrointestinal: Positive for diarrhea. Negative for nausea, vomiting, abdominal pain, constipation, blood in stool, abdominal distention and rectal pain.  Musculoskeletal: Negative for arthralgias, back pain and neck pain.  Skin: Negative for rash.  Neurological: Positive for tremors. Negative for dizziness, seizures, syncope, facial asymmetry, speech difficulty, weakness, light-headedness, numbness and headaches.  Psychiatric/Behavioral: The patient is not nervous/anxious.     Past Medical History  Diagnosis Date  . HTN (hypertension)     pt unaware of this  . Persistent atrial fibrillation   . Obesity   . H/O hiatal hernia   . Moderate mitral regurgitation    Past Surgical History  Procedure Laterality Date  . Lower back surgery      herniated nucleus pulposus left L5-S1; semihemilaminectomy and diskectomy with microdissection with microscope  . Right cornea repair      secondary to injury from a bungee cord  . Tonsillectomy    .  Cardioversion N/A 05/08/2013    unsuccessful for persistent afib   No Known Allergies Current Outpatient Prescriptions  Medication Sig Dispense Refill  . metoprolol tartrate (LOPRESSOR) 25 MG tablet Take 1 tablet (25 mg total) by mouth 2 (two) times daily.  180 tablet   2  . rivaroxaban (XARELTO) 20 MG TABS tablet Take 1 tablet (20 mg total) by mouth daily with supper.  30 tablet     No current facility-administered medications for this visit.   History   Social History  . Marital Status: Married    Spouse Name: N/A    Number of Children: N/A  . 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 Smoking status: Former Research scientist (life sciences)  . Smokeless tobacco: Former Systems developer    Quit date: 05/08/2002  . Alcohol Use: No  . Drug Use: No  . Sexual Activity: Not Currently   Other Topics Concern  . 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 Diabetes Brother   . Hyperlipidemia Mother   . Cancer Father 34    cancer unknown primary       Objective:    BP 136/64  Pulse 120  Temp(Src) 97.2 F (36.2 C) (Oral)  Resp 24  Wt 236 lb (107.049 kg)  SpO2 95%  Physical Exam  Nursing note and vitals reviewed. Constitutional: He is oriented to person, place, and time. He appears well-developed and well-nourished. No distress.  HENT:  Head: Normocephalic and atraumatic.  Right Ear: External ear normal.  Left Ear: External ear normal.  Nose: Nose normal.  Mouth/Throat: Oropharynx is clear and moist.  Eyes: Conjunctivae and EOM are normal. Pupils are equal, round, and reactive to light.  Neck: Normal range of motion. Neck supple. Carotid bruit is not present. No thyromegaly present.  Cardiovascular: Normal heart sounds and intact distal pulses.  An irregularly irregular rhythm present. Tachycardia present.  Exam reveals no gallop and no friction rub.   No murmur heard. Rate 100.  R calf larger than L calf; Hommen's negative; trace pitting edema to proximal calf B.  Pulmonary/Chest: Effort normal and breath sounds normal. No respiratory distress. He has no wheezes. He has no rales.  Abdominal: Soft. Bowel sounds are normal.  He exhibits no distension and no mass. There is no tenderness. There is no rebound and no guarding.  Musculoskeletal: Normal range of motion.  2+ DP pulses.   Lymphadenopathy:    He has no cervical adenopathy.  Neurological: He is alert and oriented to person, place, and time. No cranial nerve deficit. He exhibits normal muscle tone. Coordination normal.  Skin: Skin is warm and dry. No rash noted. He is not diaphoretic.  Bruising along B forearms.  Psychiatric: He has a normal mood and affect. His behavior is normal.    [facility] reading (PRIMARY) by  Dr. Tamala Julian. CXR:  NAD.  EKG: atrial fibrillation with RVR; rate 114.        Assessment & Plan:   1. SOB (shortness of breath)   2. Atrial fibrillation with rapid ventricular response   3. Anemia, unspecified anemia type   4. Thrombocytopenia, unspecified   5. Easy bruising     1. DOE/SOB:  New.  Secondary to atrial fibrillation with RVR.  CXR normal; pulse oximetry normal.  No evidence of severe anemia.  Glucose is stable/overall normal. To ED overnight for acute worsening. 2.  Atrial fibrillation with RVR:  Worsening; onset of atrial fibrillation in 2009.  Failed cardioversion in 2014.  Has appointment in a.m. With cardiology. Advised to take an additional Metoprolol this evening.  No acute changes to EKG.  Obtain CMET, TSH.   3.  Anemia:  New.  Recommend colonoscopy in upcoming six months to rule out colon cancer as cause of anemia.  Pt expressed understanding. 4.  Thrombocytopenia;  New; mild; recommend repeat platelet count in upcoming 3-6 months as well. 5.  Easy bruising:  New.  Obtain labs; associated with low platelet count and mild anemia; recommend repeating CBC again in upcoming 3-6 months. If persistent, refer to hematology. 6. Health maintenance; overdue for complete physical examination and preventative measures; encourage establishing with PCP.   No orders of the defined types were placed in this encounter.    No Follow-up  on file.  I personally performed the services described in this documentation, which was scribed in my presence. The recorded information has been reviewed and is accurate.  [name], M.D.  [facility] [address] [place], [place]  [address] [phone] phone [phone] fax

## 2014-05-29 NOTE — Telephone Encounter (Signed)
New message           C/o sob / pt is having a hard time breathing

## 2014-05-30 ENCOUNTER — Encounter: Payer: Self-pay | Admitting: Cardiovascular Disease

## 2014-05-30 ENCOUNTER — Ambulatory Visit (INDEPENDENT_AMBULATORY_CARE_PROVIDER_SITE_OTHER): Payer: BC Managed Care – PPO | Admitting: Cardiovascular Disease

## 2014-05-30 VITALS — BP 140/82 | HR 79 | Ht 70.0 in | Wt 238.4 lb

## 2014-05-30 DIAGNOSIS — Z7901 Long term (current) use of anticoagulants: Secondary | ICD-10-CM

## 2014-05-30 DIAGNOSIS — I4891 Unspecified atrial fibrillation: Secondary | ICD-10-CM

## 2014-05-30 DIAGNOSIS — I059 Rheumatic mitral valve disease, unspecified: Secondary | ICD-10-CM

## 2014-05-30 DIAGNOSIS — I340 Nonrheumatic mitral (valve) insufficiency: Secondary | ICD-10-CM

## 2014-05-30 DIAGNOSIS — I10 Essential (primary) hypertension: Secondary | ICD-10-CM

## 2014-05-30 DIAGNOSIS — E038 Other specified hypothyroidism: Secondary | ICD-10-CM

## 2014-05-30 DIAGNOSIS — I4819 Other persistent atrial fibrillation: Secondary | ICD-10-CM

## 2014-05-30 LAB — TSH
TSH: 0.008 u[IU]/mL — ABNORMAL LOW (ref 0.350–4.500)
TSH: 0.07 u[IU]/mL — ABNORMAL LOW (ref 0.35–4.50)

## 2014-05-30 LAB — COMPREHENSIVE METABOLIC PANEL
ALK PHOS: 109 U/L (ref 39–117)
ALT: 18 U/L (ref 0–53)
AST: 27 U/L (ref 0–37)
Albumin: 3.4 g/dL — ABNORMAL LOW (ref 3.5–5.2)
BUN: 11 mg/dL (ref 6–23)
CALCIUM: 9.1 mg/dL (ref 8.4–10.5)
CHLORIDE: 105 meq/L (ref 96–112)
CO2: 27 mEq/L (ref 19–32)
CREATININE: 0.61 mg/dL (ref 0.50–1.35)
Glucose, Bld: 89 mg/dL (ref 70–99)
POTASSIUM: 4.9 meq/L (ref 3.5–5.3)
Sodium: 140 mEq/L (ref 135–145)
Total Bilirubin: 1.3 mg/dL — ABNORMAL HIGH (ref 0.2–1.2)
Total Protein: 6.3 g/dL (ref 6.0–8.3)

## 2014-05-30 LAB — T4, FREE: FREE T4: 5.02 ng/dL — AB (ref 0.60–1.60)

## 2014-05-30 MED ORDER — RIVAROXABAN 20 MG PO TABS: 20.0000 mg | ORAL_TABLET | Freq: Every day | ORAL | Status: DC

## 2014-05-30 NOTE — Assessment & Plan Note (Signed)
Moderate by echo 2014  F/u echo needed not severe on exam

## 2014-05-30 NOTE — Assessment & Plan Note (Signed)
Well controlled.  Continue current medications and low sodium Dash type diet.    

## 2014-05-30 NOTE — Progress Notes (Signed)
Patient ID: [name], male   DOB: 1955-10-05, 58 y.o.   MRN: [id number] 58 yo with afib. Seen by PA and set up for cardioversion. Tried by Dr Acie Fredrickson 05/08/13  without success. ( [place] 120 200,200) On flecainide  Echo with mild LAE and normal EF  Normal ETT on 7/14 with no history of CAD  Flecainide stopped  Seen by Dr Rayann Heman and no ablation Considered  Afib is persistent  Seen in urgent care yesterday with palpitations and dyspnea Afib noted to be rate 120  No stroke or TIA sysmptoms No objective signs of CHF  CXR normlal and reviewed  TSH supressed at .008 No mention of this to patient.  He does not have a primary care doctor Took an extra metoprolol yesterday as only Rx  Denies stimulants drugs or excess ETOH .      ROS: Denies fever, malais, weight loss, blurry vision, decreased visual acuity, cough, sputum, SOB, hemoptysis, pleuritic pain, palpitaitons, heartburn, abdominal pain, melena, lower extremity edema, claudication, or rash.  All other systems reviewed and negative  General: Affect appropriate Healthy:  appears stated age 88: normal Neck supple with no adenopathy JVP normal no bruits no thyromegaly Lungs clear with no wheezing and good diaphragmatic motion Heart:  S1/S2 no murmur, no rub, gallop or click PMI normal Abdomen: benighn, BS positve, no tenderness, no AAA no bruit.  No HSM or HJR Distal pulses intact with no bruits No edema Neuro non-focal Skin warm and dry No muscular weakness   Current Outpatient Prescriptions  Medication Sig Dispense Refill  . metoprolol tartrate (LOPRESSOR) 25 MG tablet Take 1 tablet (25 mg total) by mouth 2 (two) times daily.  180 tablet  2   No current facility-administered medications for this visit.    Allergies  Review of patient's allergies indicates no known allergies.  Electrocardiogram:  Rapid afib no acute ST changes rate 114  Assessment and Plan

## 2014-05-30 NOTE — Patient Instructions (Addendum)
Your physician recommends that you schedule a follow-up appointment in: ASAP  WITH  DR  Rayann Heman   DISCUSS  AFIB  ABLATION 3 MONTHS WITH DR [facility]  Your physician has recommended you make the following change in your medication: INCREASE LOP[RESSOR  TO  [place]  20 MG  [place] physician has requested that you have an echocardiogram. Echocardiography is a painless test that uses sound waves to create images of your heart. It provides your doctor with information about the size and shape of your heart and how well your heart's chambers and valves are working. This procedure takes approximately one hour. There are no restrictions for this procedure. TODAY  IF   TIME  AVAILABLE  Your physician recommends that you return for lab work in: TODAY   TSH   T4

## 2014-05-30 NOTE — Assessment & Plan Note (Signed)
CHADVASC score 1  xarelto stopped by Dr Rayann Heman  BMET normal will resume 20 mg dose in setting of suppressed TSH

## 2014-05-30 NOTE — Assessment & Plan Note (Signed)
Symptomatic requiring urgent care visit  Increase lopressor 50 bid.  In setting of suppressed TSH add Xarelto.  Repeat TSH/T4 to verify accurate Refer to [facility] for Rx of hyperthyroidism.  Refer to Dr Rayann Heman for further consideration of ablation given only mild LAE.  Will need Thyroid issues addressed first.  F/U echo to assess degree of MR, EF and LA size  No loud murmur on exam

## 2014-06-03 ENCOUNTER — Other Ambulatory Visit: Payer: Self-pay

## 2014-06-03 MED ORDER — RIVAROXABAN 20 MG PO TABS: 20.0000 mg | ORAL_TABLET | Freq: Every day | ORAL | Status: DC

## 2014-06-03 MED ORDER — METOPROLOL TARTRATE 50 MG PO TABS: 25.0000 mg | ORAL_TABLET | Freq: Two times a day (BID) | ORAL | Status: DC

## 2014-06-03 MED ORDER — METOPROLOL TARTRATE 50 MG PO TABS: 50.0000 mg | ORAL_TABLET | Freq: Two times a day (BID) | ORAL | Status: DC

## 2014-06-05 ENCOUNTER — Encounter: Payer: Self-pay | Admitting: Radiology

## 2014-06-05 LAB — THYROID PEROXIDASE ANTIBODY: THYROID PEROXIDASE ANTIBODY: 142 [IU]/mL — AB (ref <9)

## 2014-06-16 ENCOUNTER — Encounter: Payer: Self-pay | Admitting: Internal Medicine

## 2014-06-16 ENCOUNTER — Ambulatory Visit (INDEPENDENT_AMBULATORY_CARE_PROVIDER_SITE_OTHER): Payer: BC Managed Care – PPO | Admitting: Internal Medicine

## 2014-06-16 ENCOUNTER — Ambulatory Visit (HOSPITAL_COMMUNITY): Payer: BC Managed Care – PPO | Attending: Cardiology

## 2014-06-16 ENCOUNTER — Encounter: Payer: Self-pay | Admitting: *Deleted

## 2014-06-16 VITALS — BP 138/80 | HR 96 | Ht 70.0 in | Wt 240.0 lb

## 2014-06-16 DIAGNOSIS — E669 Obesity, unspecified: Secondary | ICD-10-CM | POA: Diagnosis not present

## 2014-06-16 DIAGNOSIS — R609 Edema, unspecified: Secondary | ICD-10-CM | POA: Insufficient documentation

## 2014-06-16 DIAGNOSIS — I340 Nonrheumatic mitral (valve) insufficiency: Secondary | ICD-10-CM

## 2014-06-16 DIAGNOSIS — I10 Essential (primary) hypertension: Secondary | ICD-10-CM

## 2014-06-16 DIAGNOSIS — Z87891 Personal history of nicotine dependence: Secondary | ICD-10-CM | POA: Insufficient documentation

## 2014-06-16 DIAGNOSIS — I4819 Other persistent atrial fibrillation: Secondary | ICD-10-CM

## 2014-06-16 DIAGNOSIS — I481 Persistent atrial fibrillation: Secondary | ICD-10-CM

## 2014-06-16 NOTE — Patient Instructions (Signed)
Your physician has requested that you have a TEE. During a TEE, sound waves are used to create images of your heart. It provides your doctor with information about the size and shape of your heart and how well your heart's chambers and valves are working. In this test, a transducer is attached to the end of a flexible tube that's guided down your throat and into your esophagus (the tube leading from you mouth to your stomach) to get a more detailed image of your heart. You are not awake for the procedure. Please see the instruction sheet given to you today. For further information please visit [email or web address].    You have been referred to Dr Owen---for MAZE procedure

## 2014-06-16 NOTE — Progress Notes (Signed)
Primary Care Physician: none Referring Physician:  Dr Berline Chough [name] is a 58 y.o. male with a h/o persistent afib who presents today for EP follow-up.  He reports initially being diagnosed with atrial fibrillation in 2009 after presenting for preoperative ekg prior to back surgery.  He was unaware of his afib at the time.  He was treated with metoprolol and flecainide.  He reports that he could not tell any difference when in afib or in sinus.  He wore a 30 day monitor recently which revealed that he was in afib the entire time.  He underwent cardioversion 05/13/13 which was unsuccessful.  Flecainide was discontinued at that time.  An echo was performed 04/15/13 which revealed LVEF 55-60% with LVEDD 54.67mm, LA size 20mm and moderate MR.  In 2009, MR was trivial and LVEDD at that time was 46.6   He is anticoagulated with xarelto presently and continues on metoprolol 50 mg bid for rate control.  He reports that his activity level has greatly diminished over the last 6 weeks. He is very symptomatic  wth exertional dyspnea.  He had an echo earlier today and is here for further treatment for symptomatic  Afib. Echo revealed mod MR, moderate LAE, mild TR with moderate elevated pulmonary pressures. He has also become aware of mild redness, swelling and itching of rt anterior shin area. Denies hitting it. Not painful.H/o chronic lower extremity edema.  Today, he denies symptoms of palpitations, chest pain,  Positive for shortness of breath, no orthopnea, PND, positive for lower extremity edema, no dizziness, presyncope, syncope, or neurologic sequela. The patient is tolerating medications without difficulties.   Past Medical History  Diagnosis Date  . HTN (hypertension)     pt unaware of this  . Persistent atrial fibrillation   . Obesity   . H/O hiatal hernia   . Moderate mitral regurgitation    Past Surgical History  Procedure Laterality Date  . Lower back surgery      herniated  nucleus pulposus left L5-S1; semihemilaminectomy and diskectomy with microdissection with microscope  . Right cornea repair      secondary to injury from a bungee cord  . Tonsillectomy    . Cardioversion N/A 05/08/2013    unsuccessful for persistent afib    Current Outpatient Prescriptions  Medication Sig Dispense Refill  . metoprolol (LOPRESSOR) 50 MG tablet Take 1 tablet (50 mg total) by mouth 2 (two) times daily.  180 tablet  2  . rivaroxaban (XARELTO) 20 MG TABS tablet Take 1 tablet (20 mg total) by mouth daily with supper.  90 tablet  1   No current facility-administered medications for this visit.    No Known Allergies  History   Social History  . Marital Status: Married    Spouse Name: N/A    Number of Children: N/A  . 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 Smoking status: Former Research scientist (life sciences)  . Smokeless tobacco: Former Systems developer    Quit date: 05/08/2002  . Alcohol Use: No  . Drug Use: No  . Sexual Activity: Not Currently   Other Topics Concern  . 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 Diabetes Brother   . Hyperlipidemia Mother   . Cancer Father 55  cancer unknown primary    ROS- All systems are reviewed and negative except as per the HPI above  Physical Exam: Filed Vitals:   06/16/14 1452  BP: 138/80  Pulse: 96  Height: 5\' 10"  (1.778 m)  Weight: 240 lb (108.863 kg)    GEN- The patient is well appearing, alert and oriented x 3 today.   Head- normocephalic, atraumatic Eyes-  Sclera clear, conjunctiva pink Ears- hearing intact Oropharynx- clear Neck- supple, no JVP Lymph- no cervical lymphadenopathy Lungs- Clear to ausculation bilaterally, normal work of breathing Heart- irregular rate and rhythm, 2/6 SEM at the apex GI- soft, NT, ND, + BS Extremities- 1-2t bilateral edema with mild erythema of rt  anterior shin. MS- no significant deformity or atrophy Skin- no rash or lesion Psych- euthymic mood, full affect Neuro- strength and sensation are intact  EKG 10/12 revealed afib, V rate 87bpm, QTc 488  Echo 06/16/14- Left ventricle: The cavity size was mildly dilated. Wall thickness was normal. Systolic function was normal. The estimated ejection fraction was in the range of 55% to 60%. Wall motion was normal; there were no regional wall motion abnormalities. - Aortic valve: There was trivial regurgitation. - Mitral valve: There was moderate regurgitation. - Left atrium: The atrium was moderately dilated. - Right atrium: The atrium was mildly dilated. - Pulmonary arteries: Systolic pressure was moderately increased. PA peak pressure: 59 mm Hg (S).   Assessment and Plan:  1. Persistent atrial fibrillation The patient presents for EP consultation regarding his recent symptomatic persistent afib.   His CHADS2VASC score is at most 1 (He denies HTN). Therapeutic strategies for afib including rate control and rhythm control/ablation were discussed in detail with the patient today.  His anticipated success rate from ablation would be at about 60-65% with 1/3 chance of requiring multiple procedures.  There is concern that his moderate mitral insufficiency is contributing to symptoms having additional structural change since last echo. Continue metoprolol and xarelto .  2. Moderate MR The patients recent echo reveals moderate MR.  This is accompanied by significant LV enlargement (LVEDD and elevated pulmonary pressures.  I suspect that his MR may be more significant than we know and may be reponsible for his progressive symtomatic afib.  I would recommend that he have  a TEE to further evaluate mitral valve and surgical evaluation by Dr Roxy Manns . He could have a MAZE at the time of his mitral repair should that be the case.  3. Right  Anterior shin inflammation Does not appear to be a cellulitis,  possibly from venous insufficiency. Elevate legs tonight, can try a warm moist compresses, compression socks. Has a f/u with pcp tomorrow and can discuss further.  4.Recent abnormal thyroid panel indicating hyperthyroidism Pending appointment for evaluation.and further treatment..  I have seen, examined the patient, and reviewed the above assessment and plan with Roderic Palau NP.  Changes to above are made where necessary.  The patient has longstanding persistent afib.  I think that he would do better with sinus rhythm.  Unfortunately, our ability to achieve and maintain sinus rhythm is low.  I think that his anticipated success rates with an endocardial ablation are also quite low.  I would recommend referral to Dr Roxy Manns for consideration of a MAZE.  Given elevated pulmonary pressure and increased LVEDD in the setting of at least moderate MR, I am concerned that we may be underestimating his MR.  I would like for him to have a TEE prior to referral to  Dr Roxy Manns.  I will therefore schedule TEE with Dr Johnsie Cancel at the next available time.  We will have him return to the office in 8 weeks.  Co Sign: Thompson Grayer, MD 06/16/2014

## 2014-06-16 NOTE — Progress Notes (Signed)
2D Echo completed. 06/16/2014

## 2014-06-17 ENCOUNTER — Ambulatory Visit (INDEPENDENT_AMBULATORY_CARE_PROVIDER_SITE_OTHER): Payer: BC Managed Care – PPO | Admitting: Internal Medicine

## 2014-06-17 ENCOUNTER — Encounter: Payer: Self-pay | Admitting: Internal Medicine

## 2014-06-17 ENCOUNTER — Encounter (HOSPITAL_COMMUNITY): Payer: Self-pay | Admitting: Pharmacy Technician

## 2014-06-17 VITALS — BP 118/72 | HR 86 | Temp 98.2°F | Resp 16 | Ht 70.0 in | Wt 240.6 lb

## 2014-06-17 DIAGNOSIS — I481 Persistent atrial fibrillation: Secondary | ICD-10-CM

## 2014-06-17 DIAGNOSIS — Z23 Encounter for immunization: Secondary | ICD-10-CM

## 2014-06-17 DIAGNOSIS — I878 Other specified disorders of veins: Secondary | ICD-10-CM

## 2014-06-17 DIAGNOSIS — E669 Obesity, unspecified: Secondary | ICD-10-CM

## 2014-06-17 DIAGNOSIS — I10 Essential (primary) hypertension: Secondary | ICD-10-CM

## 2014-06-17 DIAGNOSIS — Z0000 Encounter for general adult medical examination without abnormal findings: Secondary | ICD-10-CM

## 2014-06-17 DIAGNOSIS — E0500 Thyrotoxicosis with diffuse goiter without thyrotoxic crisis or storm: Secondary | ICD-10-CM

## 2014-06-17 DIAGNOSIS — I4819 Other persistent atrial fibrillation: Secondary | ICD-10-CM

## 2014-06-17 LAB — CBC WITH DIFFERENTIAL/PLATELET
Basophils Absolute: 0 10*3/uL (ref 0.0–0.1)
Basophils Relative: 0.4 % (ref 0.0–3.0)
Eosinophils Absolute: 0.1 10*3/uL (ref 0.0–0.7)
Eosinophils Relative: 1.6 % (ref 0.0–5.0)
HEMATOCRIT: 37.4 % — AB (ref 39.0–52.0)
HEMOGLOBIN: 11.8 g/dL — AB (ref 13.0–17.0)
LYMPHS PCT: 18.3 % (ref 12.0–46.0)
Lymphs Abs: 1.3 10*3/uL (ref 0.7–4.0)
MCHC: 31.5 g/dL (ref 30.0–36.0)
MCV: 93.7 fl (ref 78.0–100.0)
MONOS PCT: 9.7 % (ref 3.0–12.0)
Monocytes Absolute: 0.7 10*3/uL (ref 0.1–1.0)
NEUTROS ABS: 4.8 10*3/uL (ref 1.4–7.7)
Neutrophils Relative %: 70 % (ref 43.0–77.0)
Platelets: 126 10*3/uL — ABNORMAL LOW (ref 150.0–400.0)
RBC: 3.99 Mil/uL — ABNORMAL LOW (ref 4.22–5.81)
RDW: 15.1 % (ref 11.5–15.5)
WBC: 6.9 10*3/uL (ref 4.0–10.5)

## 2014-06-17 LAB — BASIC METABOLIC PANEL
BUN: 15 mg/dL (ref 6–23)
CHLORIDE: 107 meq/L (ref 96–112)
CO2: 27 meq/L (ref 19–32)
CREATININE: 0.8 mg/dL (ref 0.4–1.5)
Calcium: 9 mg/dL (ref 8.4–10.5)
GFR: 114.06 mL/min (ref >60.00)
GLUCOSE: 97 mg/dL (ref 70–99)
Potassium: 4.6 mEq/L (ref 3.5–5.1)
Sodium: 139 mEq/L (ref 135–145)

## 2014-06-17 MED ORDER — TETANUS-DIPHTH-ACELL PERTUSSIS 5-2.5-18.5 LF-MCG/0.5 IM SUSP
0.5000 mL | Freq: Once | INTRAMUSCULAR | Status: AC
  Administered 2014-06-17: 0.5 mL via INTRAMUSCULAR

## 2014-06-17 MED ORDER — METHIMAZOLE 10 MG PO TABS: 20.0000 mg | ORAL_TABLET | Freq: Every day | ORAL | Status: DC

## 2014-06-17 MED ORDER — TRIAMCINOLONE ACETONIDE 0.1 % EX CREA: 1.0000 "application " | TOPICAL_CREAM | Freq: Two times a day (BID) | CUTANEOUS | Status: DC

## 2014-06-17 MED ORDER — TRIAMCINOLONE ACETONIDE 0.1 % EX CREA
1.0000 | TOPICAL_CREAM | Freq: Two times a day (BID) | CUTANEOUS | Status: DC
Start: 2014-06-17 — End: 2014-06-17

## 2014-06-17 MED ORDER — MEDICAL COMPRESSION STOCKINGS MISC: Status: DC

## 2014-06-17 NOTE — Assessment & Plan Note (Signed)
Without active area of cellulitis. 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

## 2014-06-17 NOTE — Assessment & Plan Note (Signed)
Encouraged exercise however at this time shortness of breath does limit his activity.

## 2014-06-17 NOTE — Assessment & Plan Note (Signed)
Recent free T4 is 5, TSH undetected. Thyroid peroxidase antibodies are very positive indicating Graves' disease. He does not have prominent goiter or exophthalmos. He is currently on beta blocker for his A. fib and will add methimazole 20 mg daily. We'll recheck free T4 in about 3 weeks. He will keep his scheduled appointment with endocrinology in December.

## 2014-06-17 NOTE — Assessment & Plan Note (Signed)
BP well-controlled today on metoprolol 50 twice a day. Reviewed recent BMP.

## 2014-06-17 NOTE — Progress Notes (Signed)
   Subjective:    Patient ID: [name], male    DOB: 1956-05-22, 58 y.o.   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 He has been having diarrhea daily for several years and having weight loss in his face and neck. He never sought medical care until he started having SOB and then his wife made him come to seek care. He is failed electroversion for his A fib and cardiology is going to perform TEE later this month with the thought of performing maze procedure in addition to possible mitral valve repair/replacement. He is still having some shortness of breath with exertion and gets winded. He sleeps. He denies chest pains. He denies abdominal bloating. He denies constipation however has daily diarrhea and feels like he has to move his bowels multiple times per day. This has been going on for several years. He denies any blood in the bowel movements. 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 He has appointment with endocrinology in December.  Review of Systems  Constitutional: Positive for activity change. Negative for fever, diaphoresis, appetite change and fatigue.  HENT: Negative.   Eyes: Negative.   Respiratory: Positive for shortness of breath. Negative for cough, chest tightness and wheezing.   Cardiovascular: Positive for leg swelling. Negative for chest pain and palpitations.  Gastrointestinal: Positive for diarrhea. Negative for nausea, abdominal pain, constipation and abdominal distention.  Endocrine: Negative for polydipsia, polyphagia and polyuria.  Genitourinary: Negative.   Musculoskeletal: Negative for arthralgias, gait problem and myalgias.  Skin: Positive for color change.  Neurological: Negative for dizziness,  weakness, light-headedness and headaches.  Psychiatric/Behavioral: Negative.       Objective:   Physical Exam  Constitutional: He is oriented to person, place, and time. He appears well-developed and well-nourished.  Overweight  HENT:  Head: Normocephalic and atraumatic.  Eyes: EOM are normal.  Neck: Normal range of motion. No JVD present. No thyromegaly present.  Cardiovascular: Normal rate and regular rhythm.   Pulmonary/Chest: Effort normal and breath sounds normal. No respiratory distress. He has no wheezes. He has no rales.  Abdominal: Bowel sounds are normal. He exhibits no distension. There is no tenderness. There is no rebound and no guarding.  Musculoskeletal: He exhibits edema.  1-2 + bilateral to the midshin  Lymphadenopathy:    He has no cervical adenopathy.  Neurological: He is alert and oriented to person, place, and time. Coordination normal.  Skin: Skin is warm and dry. There is erythema.  Some changes consistent with venous stasis on the right medial shin.    Filed Vitals:   06/17/14 0836  BP: 118/72  Pulse: 86  Temp: 98.2 F (36.8 C)  TempSrc: Oral  Resp: 16  Height: 5\' 10"  (1.778 m)  Weight: 240 lb 9.6 oz (109.135 kg)  SpO2: 92%       Assessment & Plan:  Patient given Tdap and flu shot today.

## 2014-06-17 NOTE — Progress Notes (Signed)
Pre visit review using our clinic review tool, if applicable. No additional management support is needed unless otherwise documented below in the visit note. 

## 2014-06-17 NOTE — Assessment & Plan Note (Signed)
Given Tdap and flu shot today. Talked about colonoscopy but will wait until cardiac workup complete.

## 2014-06-17 NOTE — Assessment & Plan Note (Signed)
Rate controlled on metoprolol, taking xarelto daily. TEE scheduled later this month to evaluate mitral valve. Possible Maze procedure in the future

## 2014-06-17 NOTE — Patient Instructions (Signed)
We will start you on a medicine to block some of the thyroid hormone called methimazole. Take 2 pills once a day. Come back in about 3-4 weeks for a blood test to see how well the medicine is working.   We have given you compression stockings to use for the swelling in your legs. I would advise you to wear them while you are driving. We have also sent in a cream to help with the redness. Some of the discoloration may not fade entirely but this should help the redness and itching.  We will see you back in about 3-4 months to check on your health. If you have any problems with the medicine please call us back.

## 2014-06-24 ENCOUNTER — Telehealth: Payer: Self-pay | Admitting: Internal Medicine

## 2014-06-24 ENCOUNTER — Ambulatory Visit (HOSPITAL_COMMUNITY)
Admission: RE | Admit: 2014-06-24 | Discharge: 2014-06-24 | Disposition: A | Discharge status: Home / Self Care | Payer: BC Managed Care – PPO | Source: Ambulatory Visit | Attending: Cardiovascular Disease | Admitting: Cardiovascular Disease

## 2014-06-24 ENCOUNTER — Encounter (HOSPITAL_COMMUNITY)
Admission: RE | Disposition: A | Discharge status: Home / Self Care | Payer: Self-pay | Source: Ambulatory Visit | Attending: Cardiovascular Disease

## 2014-06-24 DIAGNOSIS — E0590 Thyrotoxicosis, unspecified without thyrotoxic crisis or storm: Secondary | ICD-10-CM | POA: Insufficient documentation

## 2014-06-24 DIAGNOSIS — I340 Nonrheumatic mitral (valve) insufficiency: Secondary | ICD-10-CM | POA: Insufficient documentation

## 2014-06-24 DIAGNOSIS — I4891 Unspecified atrial fibrillation: Secondary | ICD-10-CM | POA: Diagnosis not present

## 2014-06-24 DIAGNOSIS — I059 Rheumatic mitral valve disease, unspecified: Secondary | ICD-10-CM | POA: Diagnosis present

## 2014-06-24 DIAGNOSIS — I4819 Other persistent atrial fibrillation: Secondary | ICD-10-CM

## 2014-06-24 DIAGNOSIS — I341 Nonrheumatic mitral (valve) prolapse: Secondary | ICD-10-CM

## 2014-06-24 HISTORY — PX: TEE WITHOUT CARDIOVERSION: SHX5443

## 2014-06-24 SURGERY — ECHOCARDIOGRAM, TRANSESOPHAGEAL
Anesthesia: Moderate Sedation

## 2014-06-24 MED ORDER — MIDAZOLAM HCL 5 MG/ML IJ SOLN
INTRAMUSCULAR | Status: AC
  Filled 2014-06-24: qty 2

## 2014-06-24 MED ORDER — FENTANYL CITRATE 0.05 MG/ML IJ SOLN
INTRAMUSCULAR | Status: DC | PRN
  Administered 2014-06-24 (×2): 25 ug via INTRAVENOUS

## 2014-06-24 MED ORDER — SODIUM CHLORIDE 0.9 % IV SOLN: INTRAVENOUS | Status: DC

## 2014-06-24 MED ORDER — BUTAMBEN-TETRACAINE-BENZOCAINE 2-2-14 % EX AERO
INHALATION_SPRAY | CUTANEOUS | Status: DC | PRN
Start: 2014-06-24 — End: 2014-06-24
  Administered 2014-06-24: 2 via TOPICAL

## 2014-06-24 MED ORDER — MIDAZOLAM HCL 10 MG/2ML IJ SOLN
INTRAMUSCULAR | Status: DC | PRN
  Administered 2014-06-24 (×3): 2 mg via INTRAVENOUS

## 2014-06-24 MED ORDER — FENTANYL CITRATE 0.05 MG/ML IJ SOLN
INTRAMUSCULAR | Status: AC
Start: 2014-06-24 — End: 2014-06-24
  Filled 2014-06-24: qty 2

## 2014-06-24 NOTE — Interval H&P Note (Signed)
History and Physical Interval Note:  06/24/2014 2:31 PM  [name]  has presented today for surgery, with the diagnosis of A FIB and MR  The various methods of treatment have been discussed with the patient and family. After consideration of risks, benefits and other options for treatment, the patient has consented to  Procedure(s): TRANSESOPHAGEAL ECHOCARDIOGRAM (TEE) (N/A) as a surgical intervention .  The patient's history has been reviewed, patient examined, no change in status, stable for surgery.  I have reviewed the patient's chart and labs.  Questions were answered to the patient's satisfaction.     Jenkins Rouge

## 2014-06-24 NOTE — Discharge Instructions (Signed)
Transesophageal Echocardiogram Transesophageal echocardiography (TEE) is a picture test of your heart using sound waves. The pictures taken can give very detailed pictures of your heart. This can help your doctor see if there are problems with your heart. TEE can check:  If your heart has blood clots in it.  How well your heart valves are working.  If you have an infection on the inside of your heart.  Some of the major arteries of your heart.  If your heart valve is working after a Office manager.  Your heart before a procedure that uses a shock to your heart to get the rhythm back to normal. BEFORE THE PROCEDURE  Do not eat or drink for 6 hours before the procedure or as told by your doctor.  Make plans to have someone drive you home after the procedure. Do not drive yourself home.  An IV tube will be put in your arm. PROCEDURE  You will be given a medicine to help you relax (sedative). It will be given through the IV tube.  A numbing medicine will be sprayed or gargled in the back of your throat to help numb it.  The tip of the probe is placed into the back of your mouth. You will be asked to swallow. This helps to pass the probe into your esophagus.  Once the tip of the probe is in the right place, your doctor can take pictures of your heart.  You may feel pressure at the back of your throat. AFTER THE PROCEDURE  You will be taken to a recovery area so the sedative can wear off.  Your throat may be sore and scratchy. This will go away slowly over time.  You will go home when you are fully awake and able to swallow liquids.  You should have someone stay with you for the next 24 hours.  Do not drive or operate machinery for the next 24 hours. Document Released: 06/19/2009 Document Revised: 08/27/2013 Document Reviewed: 02/21/2013 [facility] Patient Information 2015 [place], [place]. This information is not intended to replace advice given to you by your health care provider. Make  sure you discuss any questions you have with your health care provider.  Continue xarelto to prevent stroke Continue lopressor and methimazole for thyroid Do not need to see Dr Roxy Manns surgeon Will try to move up endocrinology appt.

## 2014-06-24 NOTE — CV Procedure (Signed)
   Transesophageal Echocardiogram: Indication: MR and Afib Sedation: Versed: 6, Fentanyl: 50, Other: 0 ASA: 2, Airway: 2  Procedure:  The patient was moderately sedated with the above doses of versed and fentanyl.  Using digital technique an omniplane probe was advanced into the distal esophagus without incident. Transgastric imaging revealed normal LV function with no RWMA;s and no mural apical thrombus.  Estimated ejection fraction was 55%.  Right sided cardiac chambers were normal with no evidence of pulmonary hypertension.  The pulmonary and tricuspid valves were structurally normal.  There was mild to moderate TR The mitral valve was mildly thickened.  There was no prolapse The annulus was 4.4 cm  There was moderate MR with no pulmonary vein reversal The aortic valve was trileaflet with  Mild AR The aortic root was normal.    Imaging of the septum showed no ASD or VSD 2D and color flow confirmed no PFO  The LAE was well visualized in orthogonal views.  There was no spontaneous contrast and no thrombus.   The LA was moderately dilated with a volume of 78cc  The descending thoracic aorta had mild  mural aortic debris with no evidence of aneurysmal dilation or disection  Impression:  1) Moderate MR in setting of afib and hyperthyroidism no prolapse dilated annulus.   2) Moderate LAE 3) Mild  AR 4) Mild to Moderate TR 5) Normal RV 6) No ASD 7)  No LAA thrombus 8)  Normal EF 55%  The patient is on methimazole and lopressor TSH is .008  With elevated free T4  He needs his thyroid status normalized before any further attempt [facility].  I do not think he needs a surgical consultation for MAZE  Jenkins Rouge 06/24/2014 3:08 PM

## 2014-06-24 NOTE — Telephone Encounter (Signed)
May need dose adjustment after labs first week of November and will hold off until then.

## 2014-06-24 NOTE — Progress Notes (Signed)
Echocardiogram Echocardiogram Transesophageal has been performed.  Joelene Millin 06/24/2014, 2:57 PM

## 2014-06-24 NOTE — H&P (View-Only) (Signed)
Patient ID: [name], male   DOB: 05/02/1956, 58 y.o.   MRN: [id number] 58 yo with afib. Seen by PA and set up for cardioversion. Tried by Dr [name] 05/08/13  without success. ( [place] 120 200,200) On flecainide  Echo with mild LAE and normal EF  Normal ETT on 7/14 with no history of CAD  Flecainide stopped  Seen by Dr [name] and no ablation Considered  Afib is persistent  Seen in urgent care yesterday with palpitations and dyspnea Afib noted to be rate 120  No stroke or TIA sysmptoms No objective signs of CHF  CXR normlal and reviewed  TSH supressed at .008 No mention of this to patient.  He does not have a primary care doctor Took an extra metoprolol yesterday as only Rx  Denies stimulants drugs or excess ETOH .      ROS: Denies fever, malais, weight loss, blurry vision, decreased visual acuity, cough, sputum, SOB, hemoptysis, pleuritic pain, palpitaitons, heartburn, abdominal pain, melena, lower extremity edema, claudication, or rash.  All other systems reviewed and negative  General: Affect appropriate Healthy:  appears stated age 5: normal Neck supple with no adenopathy JVP normal no bruits no thyromegaly Lungs clear with no wheezing and good diaphragmatic motion Heart:  S1/S2 no murmur, no rub, gallop or click PMI normal Abdomen: benighn, BS positve, no tenderness, no AAA no bruit.  No HSM or HJR Distal pulses intact with no bruits No edema Neuro non-focal Skin warm and dry No muscular weakness   Current Outpatient Prescriptions  Medication Sig Dispense Refill  . metoprolol tartrate (LOPRESSOR) 25 MG tablet Take 1 tablet (25 mg total) by mouth 2 (two) times daily.  180 tablet  2   No current facility-administered medications for this visit.    Allergies  Review of patient's allergies indicates no known allergies.  Electrocardiogram:  Rapid afib no acute ST changes rate 114  Assessment and Plan

## 2014-06-24 NOTE — Telephone Encounter (Signed)
Methimazole 10 mg needed for [facility].

## 2014-06-25 ENCOUNTER — Encounter (HOSPITAL_COMMUNITY): Payer: Self-pay | Admitting: Cardiovascular Disease

## 2014-06-25 ENCOUNTER — Other Ambulatory Visit: Payer: Self-pay | Admitting: *Deleted

## 2014-06-25 NOTE — Progress Notes (Signed)
LEFT MESSAGE FOR  DR  BALAN'S OFFICE TO CALL BACK TO MOVE  UP PT'S APPT .Adonis Housekeeper

## 2014-06-26 ENCOUNTER — Telehealth: Payer: Self-pay | Admitting: Cardiovascular Disease

## 2014-06-26 NOTE — Telephone Encounter (Signed)
New message  Pt called to follow thyroid specialist referral.. Pt request a call back to discuss it.Marland Kitchen

## 2014-06-27 ENCOUNTER — Other Ambulatory Visit: Payer: Self-pay | Admitting: Geriatric Medicine

## 2014-06-27 MED ORDER — METHIMAZOLE 10 MG PO TABS: 20.0000 mg | ORAL_TABLET | Freq: Every day | ORAL | Status: DC

## 2014-06-27 NOTE — Telephone Encounter (Signed)
Patient has an appointment with Dr. Chalmers Cater, but not until December. It is trying to be moved up.

## 2014-06-30 ENCOUNTER — Encounter: Payer: BC Managed Care – PPO | Admitting: Thoracic Surgery (Cardiothoracic Vascular Surgery)

## 2014-07-01 NOTE — Progress Notes (Signed)
PT HAS APPT WITH  DR  Buddy Duty 07-08-14  AT  9:20 AM .Adonis Housekeeper

## 2014-07-01 NOTE — Progress Notes (Signed)
PT  HAS APPT WITH  DR Buddy Duty  ON  07-08-14 AT  9:20 AM .Adonis Housekeeper

## 2014-08-06 ENCOUNTER — Encounter: Payer: Self-pay | Admitting: Cardiovascular Disease

## 2014-08-06 ENCOUNTER — Ambulatory Visit (INDEPENDENT_AMBULATORY_CARE_PROVIDER_SITE_OTHER): Payer: BC Managed Care – PPO | Admitting: Cardiovascular Disease

## 2014-08-06 ENCOUNTER — Encounter: Payer: Self-pay | Admitting: *Deleted

## 2014-08-06 VITALS — BP 130/70 | HR 50 | Ht 70.0 in | Wt 242.1 lb

## 2014-08-06 DIAGNOSIS — I481 Persistent atrial fibrillation: Secondary | ICD-10-CM

## 2014-08-06 DIAGNOSIS — I340 Nonrheumatic mitral (valve) insufficiency: Secondary | ICD-10-CM

## 2014-08-06 DIAGNOSIS — I10 Essential (primary) hypertension: Secondary | ICD-10-CM

## 2014-08-06 DIAGNOSIS — I4819 Other persistent atrial fibrillation: Secondary | ICD-10-CM

## 2014-08-06 DIAGNOSIS — E0500 Thyrotoxicosis with diffuse goiter without thyrotoxic crisis or storm: Secondary | ICD-10-CM

## 2014-08-06 NOTE — Assessment & Plan Note (Signed)
Moderate on TEE no indication for surgery dilated annulus

## 2014-08-06 NOTE — Assessment & Plan Note (Signed)
Well controlled.  Continue current medications and low sodium Dash type diet.    

## 2014-08-06 NOTE — Assessment & Plan Note (Signed)
Continue anticoagulation and beta blockade  Restart flecainide once TSH normal and plan repeat [facility]

## 2014-08-06 NOTE — Assessment & Plan Note (Signed)
On beta blocker and methimazole  F/U labs with Dr Buddy Duty 1/16  TSH still abnormal

## 2014-08-06 NOTE — Patient Instructions (Signed)
Your physician recommends that you schedule a follow-up appointment in: [place] Your physician recommends that you continue on your current medications as directed. Please refer to the Current Medication list given to you today.

## 2014-08-06 NOTE — Progress Notes (Signed)
Patient ID: [name], male   DOB: [birth year], 58 y.o.   MRN: [id number] 58 yo with afib. Seen by PA  September 2014 and set up for cardioversion. Tried by Dr Acie Fredrickson 05/08/13 without success. ( [place] 120 200,200) On flecainide  Echo with mild LAE and normal EF Normal ETT on 7/14 with no history of CAD Flecainide stopped Seen by Dr Rayann Heman and no ablation Considered Afib is persistent   No objective signs of CHF CXR normlal and reviewed TSH supressed at .008 No mention of this to patient. He does not have a primary care doctor  Denies stimulants drugs or excess ETOH  TEE 10//15  Impression:  1) Moderate MR in setting of afib and hyperthyroidism no prolapse dilated annulus.  2) Moderate LAE 3) Mild AR 4) Mild to Moderate TR 5) Normal RV 6) No ASD 7) No LAA thrombus 8) Normal EF 55%  Thought was to continue medications and consider repeat [facility] once thyroid Rx    ROS: Denies fever, malais, weight loss, blurry vision, decreased visual acuity, cough, sputum, SOB, hemoptysis, pleuritic pain, palpitaitons, heartburn, abdominal pain, melena, lower extremity edema, claudication, or rash.  All other systems reviewed and negative  General: Affect appropriate Healthy:  appears stated age 31: normal Neck supple with no adenopathy JVP normal no bruits no thyromegaly Lungs clear with no wheezing and good diaphragmatic motion Heart:  S1/S2 no murmur, no rub, gallop or click PMI normal Abdomen: benighn, BS positve, no tenderness, no AAA no bruit.  No HSM or HJR Distal pulses intact with no bruits No edema Neuro non-focal Skin warm and dry No muscular weakness   Current Outpatient Prescriptions  Medication Sig Dispense Refill  . methimazole (TAPAZOLE) 10 MG tablet Take 2 tablets (20 mg total) by mouth daily. 180 tablet 3  . metoprolol (LOPRESSOR) 50 MG tablet Take 1 tablet (50 mg total) by mouth 2 (two) times daily. 180 tablet 2  . rivaroxaban (XARELTO) 20 MG TABS  tablet Take 1 tablet (20 mg total) by mouth daily with supper. 90 tablet 1  . triamcinolone cream (KENALOG) 0.1 % Apply 1 application topically 2 (two) times daily. 30 g 0   No current facility-administered medications for this visit.    Allergies  Review of patient's allergies indicates no known allergies.  Electrocardiogram:  06/16/14  afib rate 96 nonspecific ST/T wave changes   Assessment and Plan

## 2014-08-12 ENCOUNTER — Ambulatory Visit (HOSPITAL_COMMUNITY): Payer: BC Managed Care – PPO | Admitting: Nurse Practitioner

## 2014-09-19 ENCOUNTER — Telehealth: Payer: Self-pay | Admitting: Internal Medicine

## 2014-09-19 DIAGNOSIS — E0590 Thyrotoxicosis, unspecified without thyrotoxic crisis or storm: Secondary | ICD-10-CM

## 2014-09-19 NOTE — Telephone Encounter (Signed)
Done

## 2014-09-19 NOTE — Telephone Encounter (Signed)
Please refer

## 2014-09-19 NOTE — Telephone Encounter (Signed)
Patient states his insurance will not cover to see his thyroid doctor.  He is requesting a referral to Dr. Loanne Drilling.

## 2014-09-19 NOTE — Telephone Encounter (Signed)
Patient aware.

## 2014-09-23 ENCOUNTER — Other Ambulatory Visit: Payer: Self-pay

## 2014-09-23 MED ORDER — RIVAROXABAN 20 MG PO TABS: 20.0000 mg | ORAL_TABLET | Freq: Every day | ORAL | Status: DC

## 2014-09-23 MED ORDER — METOPROLOL TARTRATE 50 MG PO TABS: 50.0000 mg | ORAL_TABLET | Freq: Two times a day (BID) | ORAL | Status: DC

## 2014-09-24 ENCOUNTER — Other Ambulatory Visit: Payer: Self-pay

## 2014-09-24 MED ORDER — METOPROLOL TARTRATE 50 MG PO TABS: 50.0000 mg | ORAL_TABLET | Freq: Two times a day (BID) | ORAL | Status: DC

## 2014-09-24 MED ORDER — RIVAROXABAN 20 MG PO TABS: 20.0000 mg | ORAL_TABLET | Freq: Every day | ORAL | Status: DC

## 2014-11-17 ENCOUNTER — Ambulatory Visit (INDEPENDENT_AMBULATORY_CARE_PROVIDER_SITE_OTHER): Payer: PRIVATE HEALTH INSURANCE | Admitting: Endocrinology

## 2014-11-17 ENCOUNTER — Ambulatory Visit (INDEPENDENT_AMBULATORY_CARE_PROVIDER_SITE_OTHER): Payer: PRIVATE HEALTH INSURANCE | Admitting: Cardiovascular Disease

## 2014-11-17 ENCOUNTER — Encounter: Payer: Self-pay | Admitting: Endocrinology

## 2014-11-17 ENCOUNTER — Encounter: Payer: Self-pay | Admitting: Cardiovascular Disease

## 2014-11-17 VITALS — BP 124/60 | HR 76 | Temp 97.4°F | Ht 70.0 in | Wt 241.0 lb

## 2014-11-17 VITALS — BP 112/70 | HR 64 | Ht 70.0 in | Wt 240.0 lb

## 2014-11-17 DIAGNOSIS — I4819 Other persistent atrial fibrillation: Secondary | ICD-10-CM

## 2014-11-17 DIAGNOSIS — E0590 Thyrotoxicosis, unspecified without thyrotoxic crisis or storm: Secondary | ICD-10-CM

## 2014-11-17 DIAGNOSIS — I340 Nonrheumatic mitral (valve) insufficiency: Secondary | ICD-10-CM

## 2014-11-17 DIAGNOSIS — I481 Persistent atrial fibrillation: Secondary | ICD-10-CM

## 2014-11-17 DIAGNOSIS — I10 Essential (primary) hypertension: Secondary | ICD-10-CM

## 2014-11-17 LAB — T4, FREE: FREE T4: 0.4 ng/dL — AB (ref 0.60–1.60)

## 2014-11-17 LAB — TSH: TSH: 0.14 u[IU]/mL — ABNORMAL LOW (ref 0.35–4.50)

## 2014-11-17 MED ORDER — METHIMAZOLE 10 MG PO TABS: ORAL_TABLET | ORAL | Status: DC

## 2014-11-17 NOTE — Patient Instructions (Signed)
Your physician recommends that you schedule a follow-up appointment in:  [place]  Your physician recommends that you continue on your current medications as directed. Please refer to the Current Medication list given to you today.

## 2014-11-17 NOTE — Patient Instructions (Signed)
blood tests are being requested for you today.  We'll let you know about the results. if ever you have fever while taking methimazole, stop it and call us, because of the risk of a rare side-effect.   Please come back for a follow-up appointment in 6 weeks.

## 2014-11-17 NOTE — Assessment & Plan Note (Signed)
Well controlled.  Continue current medications and low sodium Dash type diet.    

## 2014-11-17 NOTE — Progress Notes (Signed)
Subjective:    Patient ID: [name], male    DOB: [birth year], 59 y.o.   MRN: [id number]  HPI Pt reports he was dx'ed with hyperthyroidism in 2015.  As he had AF, tapazole rx was chosen, due to the need for prompt improvement in his TFT.  He has never had XRT to the anterior neck, or thyroid surgery.  He has never had thyroid imaging.  He does not consume kelp or any other prescribed or non-prescribed thyroid medication.  He has never been on amiodarone.  Pt says he never misses the tapazole (60 mg qd).  He has slight tremor of the hands, and assoc weight gain.   Past Medical History  Diagnosis Date  . HTN (hypertension)     pt unaware of this  . Persistent atrial fibrillation   . Obesity   . H/O hiatal hernia   . Moderate mitral regurgitation     Past Surgical History  Procedure Laterality Date  . Lower back surgery      herniated nucleus pulposus left L5-S1; semihemilaminectomy and diskectomy with microdissection with microscope  . Right cornea repair      secondary to injury from a bungee cord  . Tonsillectomy    . Cardioversion N/A 05/08/2013    unsuccessful for persistent afib  . Tee without cardioversion N/A 06/24/2014    Procedure: TRANSESOPHAGEAL ECHOCARDIOGRAM (TEE);  Surgeon: Josue Hector, MD;  Location: [facility] ENDOSCOPY;  Service: Cardiovascular;  Laterality: N/A;    History   Social History  . Marital Status: Married    Spouse Name: N/A  . Number of Children: N/A  . 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 Smoking status: Former Research scientist (life sciences)  . Smokeless tobacco: Former Systems developer    Quit date: 05/08/2002  . Alcohol Use: No  . Drug Use: No  . Sexual Activity: Not Currently   Other Topics Concern  . 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 metoprolol (LOPRESSOR) 50  MG tablet Take 1 tablet (50 mg total) by mouth 2 (two) times daily. 180 tablet 2  . rivaroxaban (XARELTO) 20 MG TABS tablet Take 1 tablet (20 mg total) by mouth daily with supper. 90 tablet 1   No current facility-administered medications on file prior to visit.    No Known Allergies  Family History  Problem Relation Age of Onset  . Diabetes Brother   . Hyperlipidemia Mother   . Cancer Father 91    cancer unknown primary  . Thyroid disease Mother     BP 124/60 mmHg  Pulse 76  Temp(Src) 97.4 F (36.3 C) (Oral)  Ht 5\' 10"  (1.778 m)  Wt 241 lb (109.317 kg)  BMI 34.58 kg/m2  SpO2 97%  Review of Systems denies headache, hoarseness, double vision, palpitations, sob, diarrhea, polyuria, myalgias, excessive diaphoresis, numbness, anxiety, heat intolerance, easy bruising, and rhinorrhea.      Objective:   Physical Exam VS: see vs page GEN: no distress HEAD: head: no deformity eyes: no periorbital swelling, no proptosis external nose and ears are normal.   NECK: thyroid is 5-10 times normal size.  No palpable nodule CHEST WALL: no deformity LUNGS: clear to auscultation CV: normal rate, but irreg rhythm, no murmur MUSCULOSKELETAL: muscle bulk and strength  are grossly normal.  no obvious joint swelling.  gait is normal and steady.   EXTEMITIES: no deformity.  Trace bilat leg edema NEURO:  No tremor SKIN:  Normal texture and temperature.  NODES:  None palpable at the neck PSYCH: alert, well-oriented.  Does not appear anxious nor depressed.    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  We discussed these other manifestations.  Pt will call if she develops Hyperthyroidism, due to Grave's dz: improved.   Weight gain: possible due to thyroid improvement.  He is encouraged to re-lose.   Patient is advised the following: Patient  Instructions  blood tests are being requested for you today.  We'll let you know about the results. if ever you have fever while taking methimazole, stop it and call us, because of the risk of a rare side-effect.   Please come back for a follow-up appointment in 6 weeks.    addendum: continue the same medication. Please come back for a follow-up appointment in 1 month. However, please come in to the office to recheck the blood tests in approx 2 weeks, as your thyroid might go low.

## 2014-11-17 NOTE — Assessment & Plan Note (Signed)
Will need thyroid normalized before repeat [facility]  TSH with Dr Loanne Drilling continue beta blocker and xarelto

## 2014-11-17 NOTE — Assessment & Plan Note (Signed)
Mild no murmur on exam f/u echo post [facility]

## 2014-11-17 NOTE — Progress Notes (Signed)
Patient ID: [name], male   DOB: [birth year], 59 y.o.   MRN: [id number] 59 yo with afib. Seen by PA  September 2014 and set up for cardioversion. Tried by Dr Acie Fredrickson 05/08/13 without success. ( [place] 120 200,200) On flecainide  Echo with mild LAE and normal EF Normal ETT on 7/14 with no history of CAD Flecainide stopped Seen by Dr Rayann Heman and no ablation Considered Afib is persistent   No objective signs of CHF CXR normlal and reviewed TSH supressed at .008 No mention of this to patient. He does not have a primary care doctor  Denies stimulants drugs or excess ETOH  TEE 10//15  Impression:  1) Moderate MR in setting of afib and hyperthyroidism no prolapse dilated annulus.  2) Moderate LAE 3) Mild AR 4) Mild to Moderate TR 5) Normal RV 6) No ASD 7) No LAA thrombus 8) Normal EF 55%  Thought was to continue medications and consider repeat [facility] once thyroid Rx  Seeing Dr Loanne Drilling this week Dr Buddy Duty changed insurance Still on tapazole  For suppression     ROS: Denies fever, malais, weight loss, blurry vision, decreased visual acuity, cough, sputum, SOB, hemoptysis, pleuritic pain, palpitaitons, heartburn, abdominal pain, melena, lower extremity edema, claudication, or rash.  All other systems reviewed and negative  General: Affect appropriate Healthy:  appears stated age 24: normal Neck supple with no adenopathy JVP normal no bruits no thyromegaly Lungs clear with no wheezing and good diaphragmatic motion Heart:  S1/S2 no murmur, no rub, gallop or click PMI normal Abdomen: benighn, BS positve, no tenderness, no AAA no bruit.  No HSM or HJR Distal pulses intact with no bruits No edema Neuro non-focal Skin warm and dry No muscular weakness   Current Outpatient Prescriptions  Medication Sig Dispense Refill  . methimazole (TAPAZOLE) 10 MG tablet Take 2 tablets (20 mg total) by mouth daily. (Patient taking differently: Take 20 mg by mouth daily. PT IS NOW UP TO 6  TABS A DAY AS OF 12 /2 /2015) 180 tablet 3  . metoprolol (LOPRESSOR) 50 MG tablet Take 1 tablet (50 mg total) by mouth 2 (two) times daily. 180 tablet 2  . rivaroxaban (XARELTO) 20 MG TABS tablet Take 1 tablet (20 mg total) by mouth daily with supper. 90 tablet 1   No current facility-administered medications for this visit.    Allergies  Review of patient's allergies indicates no known allergies.  Electrocardiogram:  06/16/14  afib rate 96 nonspecific ST/T wave changes   Assessment and Plan

## 2015-01-05 ENCOUNTER — Ambulatory Visit (INDEPENDENT_AMBULATORY_CARE_PROVIDER_SITE_OTHER): Payer: PRIVATE HEALTH INSURANCE | Admitting: Endocrinology

## 2015-01-05 ENCOUNTER — Encounter: Payer: Self-pay | Admitting: Endocrinology

## 2015-01-05 VITALS — BP 122/80 | HR 47 | Temp 97.7°F | Ht 70.0 in | Wt 254.0 lb

## 2015-01-05 DIAGNOSIS — E0590 Thyrotoxicosis, unspecified without thyrotoxic crisis or storm: Secondary | ICD-10-CM

## 2015-01-05 NOTE — Progress Notes (Signed)
Subjective:    Patient ID: [name], male    DOB: Jul 29, 1956, 59 y.o.   MRN: [id number]  HPI Pt returns for f/u of hyperthyroidism (dx'ed 2015; as he had AF, tapazole rx was chosen, due to the need for prompt improvement in his TFT; he has never had thyroid imaging; he has never been on amiodarone).  Since on the tapazole, pt states he feels well in general.  He denies dizziness.  He has gained weight.   Past Medical History  Diagnosis Date  . HTN (hypertension)     pt unaware of this  . Persistent atrial fibrillation   . Obesity   . H/O hiatal hernia   . Moderate mitral regurgitation     Past Surgical History  Procedure Laterality Date  . Lower back surgery      herniated nucleus pulposus left L5-S1; semihemilaminectomy and diskectomy with microdissection with microscope  . Right cornea repair      secondary to injury from a bungee cord  . Tonsillectomy    . Cardioversion N/A 05/08/2013    unsuccessful for persistent afib  . Tee without cardioversion N/A 06/24/2014    Procedure: TRANSESOPHAGEAL ECHOCARDIOGRAM (TEE);  Surgeon: Josue Hector, MD;  Location: [facility] ENDOSCOPY;  Service: Cardiovascular;  Laterality: N/A;    History   Social History  . Marital Status: Married    Spouse Name: N/A  . Number of Children: N/A  . 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 Smoking status: Former Research scientist (life sciences)  . Smokeless tobacco: Former Systems developer    Quit date: 05/08/2002  . Alcohol Use: No  . Drug Use: No  . Sexual Activity: Not Currently   Other Topics Concern  . 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 methimazole (TAPAZOLE) 10 MG tablet 6 tabs daily 540 tablet 11  . metoprolol (LOPRESSOR) 50 MG tablet Take 1 tablet (50 mg total) by mouth 2 (two) times daily. 180 tablet 2  .  rivaroxaban (XARELTO) 20 MG TABS tablet Take 1 tablet (20 mg total) by mouth daily with supper. 90 tablet 1   No current facility-administered medications on file prior to visit.    No Known Allergies  Family History  Problem Relation Age of Onset  . Diabetes Brother   . Hyperlipidemia Mother   . Cancer Father 13    cancer unknown primary  . Thyroid disease Mother     BP 122/80 mmHg  Pulse 47  Temp(Src) 97.7 F (36.5 C) (Oral)  Ht '5\' 10"'$  (1.778 m)  Wt 254 lb (115.214 kg)  BMI 36.45 kg/m2  SpO2 98%    Review of Systems Denies fever and LOC    Objective:   Physical Exam VITAL SIGNS:  See vs page GENERAL: no distress NECK: Thyroid is 5-10 times normal size, diffuse.  No thyroid nodule is palpable.  No palpable lymphadenopathy at the anterior neck.  Lab Results  Component Value Date   TSH 0.14* 11/17/2014       Assessment & Plan:  Hyperthyroidism, on rx AF: VR is overcontrolled.  If TFT are normal, we'll reduce b-blocker.  If hyperthyroidism is overcontrolled, we'll reduce tapazole instead.    Patient is advised the following: Patient Instructions  Thyroid blood tests are being  requested for you today.  We'll let you know about the results. if ever you have fever while taking methimazole, stop it and call us, because of the risk of a rare side-effect.   Based on the results, we may decrease the metoprolol.   Please come back for a follow-up appointment in 6 weeks.

## 2015-01-05 NOTE — Patient Instructions (Addendum)
Thyroid blood tests are being requested for you today.  We'll let you know about the results. if ever you have fever while taking methimazole, stop it and call us, because of the risk of a rare side-effect.   Based on the results, we may decrease the metoprolol.   Please come back for a follow-up appointment in 6 weeks.

## 2015-02-01 NOTE — Progress Notes (Signed)
Patient ID: ALDER MURRI, male   DOB: Jul 27, 1956, 59 y.o.   MRN: [id number] 58 y.o.  with afib. Seen by PA  September 2014 and set up for cardioversion. Tried by Dr Acie Fredrickson 05/08/13 without success. ( [place] 120 200,200) On flecainide  Echo with mild LAE and normal EF Normal ETT on 7/14 with no history of CAD Flecainide stopped Seen by Dr Rayann Heman and no ablation Considered Afib is persistent   No objective signs of CHF CXR normlal and reviewed TSH supressed at .008 No mention of this to patient. He does not have a primary care doctor  Lab Results  Component Value Date   TSH 0.14* 11/17/2014     Denies stimulants drugs or excess ETOH  TEE 10//15  Impression:  1) Moderate MR in setting of afib and hyperthyroidism no prolapse dilated annulus.  2) Moderate LAE 3) Mild AR 4) Mild to Moderate TR 5) Normal RV 6) No ASD 7) No LAA thrombus 8) Normal EF 55%  Thought was to continue medications and consider repeat [facility] once thyroid Rx  Seeing Dr Loanne Drilling Dr Buddy Duty changed insurance Still on tapazole  For suppression     ROS: Denies fever, malais, weight loss, blurry vision, decreased visual acuity, cough, sputum, SOB, hemoptysis, pleuritic pain, palpitaitons, heartburn, abdominal pain, melena, lower extremity edema, claudication, or rash.  All other systems reviewed and negative  General: Affect appropriate Healthy:  appears stated age 59: normal Neck supple with no adenopathy JVP normal no bruits no thyromegaly Lungs clear with no wheezing and good diaphragmatic motion Heart:  S1/S2 no murmur, no rub, gallop or click PMI normal Abdomen: benighn, BS positve, no tenderness, no AAA no bruit.  No HSM or HJR Distal pulses intact with no bruits No edema Neuro non-focal Skin warm and dry No muscular weakness   Current Outpatient Prescriptions  Medication Sig Dispense Refill  . methimazole (TAPAZOLE) 10 MG tablet 6 tabs daily 540 tablet 11  . metoprolol (LOPRESSOR) 50 MG  tablet Take 1 tablet (50 mg total) by mouth 2 (two) times daily. 180 tablet 2  . rivaroxaban (XARELTO) 20 MG TABS tablet Take 1 tablet (20 mg total) by mouth daily with supper. 90 tablet 1   No current facility-administered medications for this visit.    Allergies  Review of patient's allergies indicates no known allergies.  Electrocardiogram:  06/16/14  afib rate 96 nonspecific ST/T wave changes   Assessment and Plan Afib:  Will check TSH today.  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   Hopefully TSH better now will let Dr Loanne Drilling know we are waiting for thyroid to be more regulated to do [facility]  Thyroid:  Previously seen by Buddy Duty now [place]. Does not seem to be aggressively trying to get TSH normalized Continue tapazole and metoprolol  Jenkins Rouge

## 2015-02-02 ENCOUNTER — Other Ambulatory Visit: Payer: Self-pay | Admitting: Cardiovascular Disease

## 2015-02-04 ENCOUNTER — Encounter: Payer: Self-pay | Admitting: Cardiovascular Disease

## 2015-02-04 ENCOUNTER — Ambulatory Visit (INDEPENDENT_AMBULATORY_CARE_PROVIDER_SITE_OTHER): Payer: PRIVATE HEALTH INSURANCE | Admitting: Cardiovascular Disease

## 2015-02-04 VITALS — BP 126/74 | HR 80 | Ht 70.0 in | Wt 257.0 lb

## 2015-02-04 DIAGNOSIS — E0590 Thyrotoxicosis, unspecified without thyrotoxic crisis or storm: Secondary | ICD-10-CM

## 2015-02-04 NOTE — Patient Instructions (Signed)
Medication Instructions:  NO CHANGES  Labwork: TODAY TSH   AND T4   Testing/Procedures: Your physician has recommended that you have a Cardioversion (DCCV). Electrical Cardioversion uses a jolt of electricity to your heart either through paddles or wired patches attached to your chest. This is a controlled, usually prescheduled, procedure. Defibrillation is done under light anesthesia in the hospital, and you usually go home the day of the procedure. This is done to get your heart back into a normal rhythm. You are not awake for the procedure. Please see the instruction sheet given to you today.   AUG   4 TH    Follow-Up: Your physician wants you to follow-up in: [place] will receive a reminder letter in the mail two months in advance. If you don't receive a letter, please call our office to schedule the follow-up appointment.  Any Other Special Instructions Will Be Listed Below (If Applicable).

## 2015-02-05 ENCOUNTER — Telehealth: Payer: Self-pay | Admitting: *Deleted

## 2015-02-05 LAB — TSH: TSH: 8.3 u[IU]/mL — ABNORMAL HIGH (ref 0.35–4.50)

## 2015-02-05 NOTE — Telephone Encounter (Signed)
Left message on pts confirmed voicemail that his lab results ordered by Dr Johnsie Cancel yesterday indicated his TSH was elevated at 8.30.  Pt does see Dr Loanne Drilling for his thyroid issues.  Showed our DOD Flex Cecilie Kicks NP the pts lab results and elevated TSH, and per Cecilie Kicks NP the pt should follow-up with Dr Loanne Drilling for hyperthyroid issues and maintenance of medication. Will defer this message to Dr Johnsie Cancel in result notes.

## 2015-02-09 ENCOUNTER — Ambulatory Visit: Payer: PRIVATE HEALTH INSURANCE | Admitting: Endocrinology

## 2015-02-09 ENCOUNTER — Other Ambulatory Visit: Payer: Self-pay | Admitting: *Deleted

## 2015-02-09 MED ORDER — RIVAROXABAN 20 MG PO TABS: ORAL_TABLET | ORAL | Status: DC

## 2015-02-10 ENCOUNTER — Ambulatory Visit (INDEPENDENT_AMBULATORY_CARE_PROVIDER_SITE_OTHER): Payer: PRIVATE HEALTH INSURANCE | Admitting: Endocrinology

## 2015-02-10 ENCOUNTER — Encounter: Payer: Self-pay | Admitting: Endocrinology

## 2015-02-10 VITALS — BP 130/80 | HR 66 | Temp 97.7°F | Ht 70.0 in | Wt 263.0 lb

## 2015-02-10 DIAGNOSIS — E0590 Thyrotoxicosis, unspecified without thyrotoxic crisis or storm: Secondary | ICD-10-CM

## 2015-02-10 MED ORDER — METHIMAZOLE 10 MG PO TABS: 20.0000 mg | ORAL_TABLET | Freq: Two times a day (BID) | ORAL | Status: DC

## 2015-02-10 NOTE — Patient Instructions (Addendum)
Please stop methimazole x 1 week, then resume at 2 pills, twice a day. if ever you have fever while taking methimazole, stop it and call us, because of the risk of a rare side-effect.   Please come back for a follow-up appointment in 1 month.

## 2015-02-10 NOTE — Progress Notes (Signed)
Subjective:    Patient ID: [name], male    DOB: 1956/04/04, 59 y.o.   MRN: [id number]  HPI Pt returns for f/u of hyperthyroidism (dx'ed 2015; as he had AF, tapazole rx was chosen, due to the need for prompt improvement in his TFT; he has never had thyroid imaging; he has never been on amiodarone).  The may, 2016 TFT were ordered but never resulted.  pt states he feels well in general. Past Medical History  Diagnosis Date  . HTN (hypertension)     pt unaware of this  . Persistent atrial fibrillation   . Obesity   . H/O hiatal hernia   . Moderate mitral regurgitation     Past Surgical History  Procedure Laterality Date  . Lower back surgery      herniated nucleus pulposus left L5-S1; semihemilaminectomy and diskectomy with microdissection with microscope  . Right cornea repair      secondary to injury from a bungee cord  . Tonsillectomy    . Cardioversion N/A 05/08/2013    unsuccessful for persistent afib  . Tee without cardioversion N/A 06/24/2014    Procedure: TRANSESOPHAGEAL ECHOCARDIOGRAM (TEE);  Surgeon: Josue Hector, MD;  Location: [facility] ENDOSCOPY;  Service: Cardiovascular;  Laterality: N/A;    History   Social History  . Marital Status: Married    Spouse Name: N/A  . Number of Children: N/A  . 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 Smoking status: Former Research scientist (life sciences)  . Smokeless tobacco: Former Systems developer    Quit date: 05/08/2002  . Alcohol Use: No  . Drug Use: No  . Sexual Activity: Not Currently   Other Topics Concern  . Not on file   Social History Narrative   Pt lives in [place] with spouse.  Truck driver    Current Outpatient Prescriptions on File Prior to Visit  Medication Sig Dispense Refill  . metoprolol (LOPRESSOR) 50 MG tablet Take 1 tablet (50 mg total) by mouth 2 (two) times daily. 180 tablet 2  . rivaroxaban (XARELTO) 20 MG TABS tablet Take 1 tablet by mouth  daily with supper 90 tablet 3     No current facility-administered medications on file prior to visit.    No Known Allergies  Family History  Problem Relation Age of Onset  . Diabetes Brother   . Hyperlipidemia Mother   . Cancer Father 53    cancer unknown primary  . Thyroid disease Mother     BP 130/80 mmHg  Pulse 66  Temp(Src) 97.7 F (36.5 C) (Oral)  Ht '5\' 10"'$  (1.778 m)  Wt 263 lb (119.296 kg)  BMI 37.74 kg/m2  SpO2 97%    Review of Systems Denies fever    Objective:   Physical Exam VITAL SIGNS:  See vs page GENERAL: no distress NECK: Thyroid is 5-10 times normal size, diffuse.  No thyroid nodule is palpable.  No palpable lymphadenopathy at the anterior neck.   Lab Results  Component Value Date   TSH 8.30* 02/04/2015      Assessment & Plan:  Hyperthyroidism: he needs reduced rx.  Patient is advised the following: Patient Instructions  Please stop methimazole x 1 week, then resume at 2 pills, twice a day. if ever you have fever while taking methimazole, stop it and call us, because of the risk of a rare side-effect.   Please come back for a follow-up appointment in 1 month.

## 2015-03-16 ENCOUNTER — Ambulatory Visit: Payer: PRIVATE HEALTH INSURANCE | Admitting: Endocrinology

## 2015-04-01 ENCOUNTER — Other Ambulatory Visit: Payer: Self-pay

## 2015-04-01 MED ORDER — METOPROLOL TARTRATE 50 MG PO TABS: 50.0000 mg | ORAL_TABLET | Freq: Two times a day (BID) | ORAL | Status: DC

## 2015-04-01 MED ORDER — RIVAROXABAN 20 MG PO TABS: ORAL_TABLET | ORAL | Status: DC

## 2015-04-08 ENCOUNTER — Other Ambulatory Visit (INDEPENDENT_AMBULATORY_CARE_PROVIDER_SITE_OTHER): Payer: PRIVATE HEALTH INSURANCE

## 2015-04-08 ENCOUNTER — Telehealth: Payer: Self-pay | Admitting: Cardiovascular Disease

## 2015-04-08 DIAGNOSIS — Z01812 Encounter for preprocedural laboratory examination: Secondary | ICD-10-CM

## 2015-04-08 DIAGNOSIS — I4819 Other persistent atrial fibrillation: Secondary | ICD-10-CM

## 2015-04-08 DIAGNOSIS — I481 Persistent atrial fibrillation: Secondary | ICD-10-CM

## 2015-04-08 LAB — CBC WITH DIFFERENTIAL/PLATELET
Basophils Absolute: 0.1 10*3/uL (ref 0.0–0.1)
Basophils Relative: 0.5 % (ref 0.0–3.0)
Eosinophils Absolute: 0.2 10*3/uL (ref 0.0–0.7)
Eosinophils Relative: 2.4 % (ref 0.0–5.0)
HCT: 46.4 % (ref 39.0–52.0)
Hemoglobin: 15.7 g/dL (ref 13.0–17.0)
LYMPHS ABS: 2.8 10*3/uL (ref 0.7–4.0)
Lymphocytes Relative: 27.2 % (ref 12.0–46.0)
MCHC: 33.8 g/dL (ref 30.0–36.0)
MCV: 99.1 fl (ref 78.0–100.0)
Monocytes Absolute: 0.8 10*3/uL (ref 0.1–1.0)
Monocytes Relative: 8 % (ref 3.0–12.0)
NEUTROS PCT: 61.9 % (ref 43.0–77.0)
Neutro Abs: 6.4 10*3/uL (ref 1.4–7.7)
PLATELETS: 138 10*3/uL — AB (ref 150.0–400.0)
RBC: 4.68 Mil/uL (ref 4.22–5.81)
RDW: 14.2 % (ref 11.5–15.5)
WBC: 10.3 10*3/uL (ref 4.0–10.5)

## 2015-04-08 LAB — COMPREHENSIVE METABOLIC PANEL
ALBUMIN: 4.2 g/dL (ref 3.5–5.2)
ALT: 17 U/L (ref 0–53)
AST: 34 U/L (ref 0–37)
Alkaline Phosphatase: 101 U/L (ref 39–117)
BUN: 21 mg/dL (ref 6–23)
CALCIUM: 9.4 mg/dL (ref 8.4–10.5)
CO2: 29 mEq/L (ref 19–32)
CREATININE: 1.27 mg/dL (ref 0.40–1.50)
Chloride: 104 mEq/L (ref 96–112)
GFR: 61.93 mL/min (ref >60.00)
Glucose, Bld: 70 mg/dL (ref 70–99)
Potassium: 4.5 mEq/L (ref 3.5–5.1)
Sodium: 139 mEq/L (ref 135–145)
TOTAL PROTEIN: 7.6 g/dL (ref 6.0–8.3)
Total Bilirubin: 0.7 mg/dL (ref 0.2–1.2)

## 2015-04-08 LAB — PROTIME-INR
INR: 1.2 ratio — ABNORMAL HIGH (ref 0.8–1.0)
PROTHROMBIN TIME: 13.6 s — AB (ref 9.6–13.1)

## 2015-04-08 NOTE — Telephone Encounter (Signed)
New Message    Pt is calling because he is stating that he is scheduled for his heart to get shocked by   Dr.Nishan on 04/09/15 and labs i spoke to Clifford and informed the pt that his rn will return on Friday and that  A date and time will needed from him to see when is the best time to schedule procedure and lab. Pt is up set he hung up  Speaking to [place], she informed me to send message to Triage  Please call pt

## 2015-04-08 NOTE — Telephone Encounter (Signed)
Contacted the pt and he was very upset for he states that at his last OV with Dr Johnsie Cancel, his AVS stated that he was to have a cardioversion done on 04/09/15 and pre-labs on 04/08/15 at our office.  Noted in pts chart that his AVS did say 8/4 for DCCV, but this was never scheduled at the hospital.  No labs ordered or appt made for the pt to come in today for pre-procedure labs.  No instruction letter noted in pts chart to have DCCV for 8/4 and lab on 8/3.  Informed the pt of this and he was extremely upset, for he just came from the [facility] this week, in preparation to have his DCCV done on 8/4 and lab 8/3.  Informed the pt that we will call over to the hospital and talk with Dr Johnsie Cancel and central scheduling, to see if he can be worked in for his cardioversion for tomorrow 8/4, due to long commute to be here.    Notified the pt that we spoke with central scheduling and Dr Johnsie Cancel, and the pt can come to have his cardioversion for tomorrow Thursday 04/09/15 with Dr Johnsie Cancel at 11 AM.  Informed the pt he must arrive at short stay center at 0930 for pre-workup for his cardioversion.  Informed the pt to come in for his pre-cardioversion labs today and request to speak with a triage nurse when he arrives to go over his cardioversion instruction letter.  Asked the pt if he has been taking his Xarelto as prescribed, and pt states he takes it exactly as instructed with no missed doses.  Pt verbalized understanding and agrees with this place.  Pt gracious for all the assistance provided.

## 2015-04-09 ENCOUNTER — Encounter (HOSPITAL_COMMUNITY)
Admission: RE | Disposition: A | Discharge status: Home / Self Care | Payer: Self-pay | Source: Ambulatory Visit | Attending: Cardiovascular Disease

## 2015-04-09 ENCOUNTER — Telehealth: Payer: Self-pay | Admitting: Cardiovascular Disease

## 2015-04-09 ENCOUNTER — Ambulatory Visit (HOSPITAL_COMMUNITY): Payer: No Typology Code available for payment source | Admitting: Anesthesiology

## 2015-04-09 ENCOUNTER — Ambulatory Visit (HOSPITAL_COMMUNITY)
Admission: RE | Admit: 2015-04-09 | Discharge: 2015-04-09 | Disposition: A | Discharge status: Home / Self Care | Payer: No Typology Code available for payment source | Source: Ambulatory Visit | Attending: Cardiovascular Disease | Admitting: Cardiovascular Disease

## 2015-04-09 DIAGNOSIS — Z7901 Long term (current) use of anticoagulants: Secondary | ICD-10-CM | POA: Insufficient documentation

## 2015-04-09 DIAGNOSIS — I10 Essential (primary) hypertension: Secondary | ICD-10-CM | POA: Insufficient documentation

## 2015-04-09 DIAGNOSIS — I481 Persistent atrial fibrillation: Secondary | ICD-10-CM | POA: Insufficient documentation

## 2015-04-09 DIAGNOSIS — Z87891 Personal history of nicotine dependence: Secondary | ICD-10-CM | POA: Insufficient documentation

## 2015-04-09 DIAGNOSIS — Z79899 Other long term (current) drug therapy: Secondary | ICD-10-CM | POA: Insufficient documentation

## 2015-04-09 DIAGNOSIS — E0590 Thyrotoxicosis, unspecified without thyrotoxic crisis or storm: Secondary | ICD-10-CM | POA: Diagnosis not present

## 2015-04-09 DIAGNOSIS — I4891 Unspecified atrial fibrillation: Secondary | ICD-10-CM | POA: Diagnosis not present

## 2015-04-09 DIAGNOSIS — I340 Nonrheumatic mitral (valve) insufficiency: Secondary | ICD-10-CM | POA: Insufficient documentation

## 2015-04-09 DIAGNOSIS — K449 Diaphragmatic hernia without obstruction or gangrene: Secondary | ICD-10-CM | POA: Diagnosis not present

## 2015-04-09 HISTORY — PX: CARDIOVERSION: SHX1299

## 2015-04-09 SURGERY — CARDIOVERSION
Anesthesia: Monitor Anesthesia Care

## 2015-04-09 MED ORDER — METOPROLOL TARTRATE 50 MG PO TABS: 25.0000 mg | ORAL_TABLET | Freq: Two times a day (BID) | ORAL | Status: DC

## 2015-04-09 MED ORDER — PROPOFOL 10 MG/ML IV BOLUS
INTRAVENOUS | Status: DC | PRN
  Administered 2015-04-09: 80 mg via INTRAVENOUS

## 2015-04-09 MED ORDER — LIDOCAINE HCL (CARDIAC) 20 MG/ML IV SOLN
INTRAVENOUS | Status: DC | PRN
  Administered 2015-04-09: 30 mg via INTRAVENOUS

## 2015-04-09 MED ORDER — SODIUM CHLORIDE 0.9 % IV SOLN
INTRAVENOUS | Status: DC | PRN
  Administered 2015-04-09: 11:00:00 via INTRAVENOUS

## 2015-04-09 NOTE — Anesthesia Postprocedure Evaluation (Signed)
  Anesthesia Post-op Note  Patient: [name]  Procedure(s) Performed: Procedure(s): CARDIOVERSION (N/A)  Patient Location: PACU  Anesthesia Type:MAC  Level of Consciousness: awake  Airway and Oxygen Therapy: Patient Spontanous Breathing  Post-op Pain: none  Post-op Assessment: Post-op Vital signs reviewed              Post-op Vital Signs: Reviewed and stable  Last Vitals:  Filed Vitals:   04/09/15 1051  BP: 96/52  Pulse:   Temp:   Resp:     Complications: No apparent anesthesia complications

## 2015-04-09 NOTE — Discharge Instructions (Signed)
F/U Dr Donald Pore will call Continue xarelto Decrease metoprolol to 25 bid

## 2015-04-09 NOTE — H&P (Signed)
  Patient ID: [name], male DOB: 04-Jan-1956, 59 y.o. MRN: [id number] 59 yo with afib. Seen by PA September 2014 and set up for cardioversion. Tried by Dr Acie Fredrickson 05/08/13 without success. ( [place] 120 200,200) On flecainide  Echo with mild LAE and normal EF Normal ETT on 7/14 with no history of CAD Flecainide stopped Seen by Dr Rayann Heman and no ablation Considered Afib is persistent   No objective signs of CHF CXR normlal and reviewed TSH supressed at .008 No mention of this to patient. He does not have a primary care doctor Denies stimulants drugs or excess ETOH  TEE 10//15  Impression:  1) Moderate MR in setting of afib and hyperthyroidism no prolapse dilated annulus.  2) Moderate LAE 3) Mild AR 4) Mild to Moderate TR 5) Normal RV 6) No ASD 7) No LAA thrombus 8) Normal EF 55%  Thought was to continue medications and consider repeat [facility] once thyroid Rx Seeing Dr Loanne Drilling this week Dr Buddy Duty changed insurance Still on tapazole  For suppression     ROS: Denies fever, malais, weight loss, blurry vision, decreased visual acuity, cough, sputum, SOB, hemoptysis, pleuritic pain, palpitaitons, heartburn, abdominal pain, melena, lower extremity edema, claudication, or rash. All other systems reviewed and negative  General: Affect appropriate Healthy: appears stated age 59: normal Neck supple with no adenopathy JVP normal no bruits no thyromegaly Lungs clear with no wheezing and good diaphragmatic motion Heart: S1/S2 no murmur, no rub, gallop or click PMI normal Abdomen: benighn, BS positve, no tenderness, no AAA no bruit. No HSM or HJR Distal pulses intact with no bruits No edema Neuro non-focal Skin warm and dry No muscular weakness   Current Outpatient Prescriptions  Medication Sig Dispense Refill  . methimazole (TAPAZOLE) 10 MG tablet Take 2 tablets (20 mg total) by mouth daily. (Patient taking differently: Take 20 mg by mouth daily. PT IS  NOW UP TO 6 TABS A DAY AS OF 12 /2 /2015) 180 tablet 3  . metoprolol (LOPRESSOR) 50 MG tablet Take 1 tablet (50 mg total) by mouth 2 (two) times daily. 180 tablet 2  . rivaroxaban (XARELTO) 20 MG TABS tablet Take 1 tablet (20 mg total) by mouth daily with supper. 90 tablet 1   No current facility-administered medications for this visit.    Allergies  Review of patient's allergies indicates no known allergies.  Electrocardiogram: 06/16/14 afib rate 96 nonspecific ST/T wave changes   Assessment and Plan             Essential hypertension - Josue Hector, MD at 11/17/2014 11:22 AM     Status: Written Related Problem: Essential hypertension   Expand All Collapse All   Well controlled. Continue current medications and low sodium Dash type diet.              Persistent atrial fibrillation - Josue Hector, MD at 11/17/2014 11:22 AM     Status: Written Related Problem: Persistent atrial fibrillation   Expand All Collapse All   [place] today on xarelto with no missed dose             Mitral regurgitation - Josue Hector, MD at 11/17/2014 11:23 AM     Status: Written Related Problem: Mitral regurgitation   Expand All Collapse All   Mild no murmur on exam f/u echo post [facility]

## 2015-04-09 NOTE — Telephone Encounter (Signed)
Called received to triage from [facility] at [facility] Pre-Services. The patient is scheduled for DCCV today. They do not have a prior auth # for the patient.  Discussed with Charmaine (pre-cert dept). She reports that she just received the request yesterday afternoon and has not yet processed. CPT code given [phone]) for Iris to call insurance directly regarding prior auth.  Per Iris, she did call the insurance department yesterday afternoon and was told prior authorization was required. I informed Iris I would let prior auth dept know this and she is requesting a call back from them at the # given [phone].

## 2015-04-09 NOTE — CV Procedure (Signed)
DCC:  Propofol and lidocaine given by anesthesia On Rx xarelto TSH now normalized  AFib rate 48-56 prior to [facility] Single 120 J biphasic shock given with conversion to NSR rate 54 No immediate neurologic sequelae.  Decrease metoprolol to 25 bid Continue xarelto F/U with me 4-6 weeks  [name]

## 2015-04-09 NOTE — Telephone Encounter (Signed)
Spoke w/Iris@ preservice center.  Case was ordered last minute and precert not notified in time to get auth#.  Clinicals faxed stat and insurance company will rush case.

## 2015-04-09 NOTE — Anesthesia Preprocedure Evaluation (Addendum)
Anesthesia Evaluation  Patient identified by MRN, date of birth, ID band Patient awake    Reviewed: Allergy & Precautions, NPO status , Patient's Chart, lab work & pertinent test results, reviewed documented beta blocker date and time   Airway Mallampati: III   Neck ROM: Full    Dental  (+) Edentulous Upper, Dental Advisory Given   Pulmonary former smoker,  breath sounds clear to auscultation        Cardiovascular hypertension, Pt. on medications + dysrhythmias Atrial Fibrillation Rhythm:Irregular  EF 55% by ECHO 2015   Neuro/Psych negative neurological ROS     GI/Hepatic hiatal hernia,   Endo/Other  Hyperthyroidism   Renal/GU      Musculoskeletal   Abdominal (+)  Abdomen: soft.    Peds  Hematology   Anesthesia Other Findings   Reproductive/Obstetrics                            Anesthesia Physical Anesthesia Plan  ASA: III  Anesthesia Plan: MAC   Post-op Pain Management:    Induction: Intravenous  Airway Management Planned: Nasal Cannula  Additional Equipment:   Intra-op Plan:   Post-operative Plan:   Informed Consent: I have reviewed the patients History and Physical, chart, labs and discussed the procedure including the risks, benefits and alternatives for the proposed anesthesia with the patient or authorized representative who has indicated his/her understanding and acceptance.     Plan Discussed with:   Anesthesia Plan Comments:         Anesthesia Quick Evaluation

## 2015-04-09 NOTE — Transfer of Care (Signed)
Immediate Anesthesia Transfer of Care Note  Patient: [name]  Procedure(s) Performed: Procedure(s): CARDIOVERSION (N/A)  Patient Location: Endoscopy Unit  Anesthesia Type:MAC  Level of Consciousness: sedated  Airway & Oxygen Therapy: Patient Spontanous Breathing and Patient connected to nasal cannula oxygen  Post-op Assessment: Report given to RN and Post -op Vital signs reviewed and stable  Post vital signs: Reviewed and stable  Last Vitals:  Filed Vitals:   04/09/15 1051  BP: 96/52  Pulse:   Temp:   Resp:     Complications: No apparent anesthesia complications

## 2015-04-10 ENCOUNTER — Telehealth: Payer: Self-pay | Admitting: *Deleted

## 2015-04-10 ENCOUNTER — Encounter (HOSPITAL_COMMUNITY): Payer: Self-pay | Admitting: Cardiovascular Disease

## 2015-04-10 NOTE — Telephone Encounter (Signed)
-----   Message from Josue Hector, MD sent at 04/09/2015 10:53 AM EDT ----- Needs post [facility] f/u with me or PA next 3-6 weeks

## 2015-04-10 NOTE — Telephone Encounter (Signed)
PT  WILL CALL BACK  NEXT  WEEK AND  SCHEDULE  APPT  .Adonis Housekeeper

## 2015-05-12 ENCOUNTER — Encounter: Payer: Self-pay | Admitting: Cardiology

## 2015-05-12 ENCOUNTER — Ambulatory Visit (INDEPENDENT_AMBULATORY_CARE_PROVIDER_SITE_OTHER): Payer: PRIVATE HEALTH INSURANCE | Admitting: Cardiology

## 2015-05-12 VITALS — BP 140/80 | HR 66 | Ht 70.0 in | Wt 273.0 lb

## 2015-05-12 DIAGNOSIS — E0500 Thyrotoxicosis with diffuse goiter without thyrotoxic crisis or storm: Secondary | ICD-10-CM

## 2015-05-12 DIAGNOSIS — D696 Thrombocytopenia, unspecified: Secondary | ICD-10-CM

## 2015-05-12 DIAGNOSIS — I480 Paroxysmal atrial fibrillation: Secondary | ICD-10-CM | POA: Diagnosis not present

## 2015-05-12 DIAGNOSIS — Z7901 Long term (current) use of anticoagulants: Secondary | ICD-10-CM

## 2015-05-12 DIAGNOSIS — I4819 Other persistent atrial fibrillation: Secondary | ICD-10-CM

## 2015-05-12 DIAGNOSIS — I10 Essential (primary) hypertension: Secondary | ICD-10-CM | POA: Diagnosis not present

## 2015-05-12 DIAGNOSIS — I481 Persistent atrial fibrillation: Secondary | ICD-10-CM | POA: Diagnosis not present

## 2015-05-12 LAB — CBC WITH DIFFERENTIAL/PLATELET
BASOS PCT: 0.8 % (ref 0.0–3.0)
Basophils Absolute: 0.1 10*3/uL (ref 0.0–0.1)
EOS ABS: 0.3 10*3/uL (ref 0.0–0.7)
EOS PCT: 3.7 % (ref 0.0–5.0)
HCT: 44.1 % (ref 39.0–52.0)
HEMOGLOBIN: 14.8 g/dL (ref 13.0–17.0)
LYMPHS ABS: 2 10*3/uL (ref 0.7–4.0)
LYMPHS PCT: 23.5 % (ref 12.0–46.0)
MCHC: 33.5 g/dL (ref 30.0–36.0)
MCV: 99.6 fl (ref 78.0–100.0)
MONO ABS: 0.5 10*3/uL (ref 0.1–1.0)
Monocytes Relative: 6.2 % (ref 3.0–12.0)
NEUTROS PCT: 65.8 % (ref 43.0–77.0)
Neutro Abs: 5.5 10*3/uL (ref 1.4–7.7)
Platelets: 127 10*3/uL — ABNORMAL LOW (ref 150.0–400.0)
RBC: 4.43 Mil/uL (ref 4.22–5.81)
RDW: 14.2 % (ref 11.5–15.5)
WBC: 8.3 10*3/uL (ref 4.0–10.5)

## 2015-05-12 LAB — TSH: TSH: 23.27 u[IU]/mL — AB (ref 0.35–4.50)

## 2015-05-12 LAB — BASIC METABOLIC PANEL
BUN: 19 mg/dL (ref 6–23)
CHLORIDE: 104 meq/L (ref 96–112)
CO2: 27 mEq/L (ref 19–32)
CREATININE: 1.37 mg/dL (ref 0.40–1.50)
Calcium: 9.4 mg/dL (ref 8.4–10.5)
GFR: 56.73 mL/min — ABNORMAL LOW (ref >60.00)
Glucose, Bld: 94 mg/dL (ref 70–99)
Potassium: 4.2 mEq/L (ref 3.5–5.1)
Sodium: 139 mEq/L (ref 135–145)

## 2015-05-12 LAB — T3, FREE: T3 FREE: 1.5 pg/mL — AB (ref 2.3–4.2)

## 2015-05-12 NOTE — Patient Instructions (Signed)
Medication Instructions:  Your physician recommends that you continue on your current medications as directed. Please refer to the Current Medication list given to you today.   Labwork: TODAY:  CBC W/ DIFF                 TSH                 T 4 FREE                 T3 FREE                 BMP  Testing/Procedures: None ordered  Follow-Up: Your physician wants you to follow-up in:   6 WEEKS with Dr. Blima Singer will receive a reminder letter in the mail two months in advance. If you don't receive a letter, please call our office to schedule the follow-up appointment.

## 2015-05-12 NOTE — Progress Notes (Signed)
Cardiology Office Note   Date:  05/12/2015   ID:  Mitsugi, Schrader [birth year], MRN [id number]  PCP:  Olga Millers, MD  Cardiologist:  Dr. Johnsie Cancel    Chief Complaint  Patient presents with  . Hospitalization Follow-up    post DCCV.      History of Present Illness: PAITON FOSCO is a 59 y.o. male who presents for post DCCV for  A fib.  He underwent DCCV 04/09/15.    He has a hx of persistent a fib and hyperthyroid with  DCCV by Dr Acie Fredrickson 05/08/13 without success. ( [place] 120 200,200) On flecainide  Echo with mild LAE and normal EF Normal ETT on 7/14 with no history of CAD Flecainide stopped Seen by Dr Rayann Heman and no ablation   No objective signs of CHF CXR normlal and reviewed TSH supressed at .008 -treated and most recent in June was 8.30              Today post DCCV  With 120 J, to SR at 54, BB was decreased to 25 mg BID.  Continues on Xarelto.  He only complains of his thyroid, now with fatigue and complaints of difficulty moving his neck especially forward.  This occurred last week.  Neck swollen and he is to see Dr. Loanne Drilling tomorrow.  He asks that we do labs today.  On lab review his plts were low prior to DCCV.  Pt without change in symptoms since DCCV, he could not tell he was in a fib.   Since control of thyroid he has gained wt.  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  He does not have time to exercise.  encouraged healthy diet and exercise.    Denies chest pain or SOB.    Past Medical History  Diagnosis Date  . HTN (hypertension)     pt unaware of this  . Persistent atrial fibrillation     now SR after DCCV   . Obesity   . H/O hiatal hernia   . Moderate mitral regurgitation   . Hyperthyroidism     treated with tapazole    Past Surgical History  Procedure Laterality Date  . Lower back surgery      herniated nucleus pulposus left L5-S1; semihemilaminectomy and diskectomy with microdissection  with microscope  . Right cornea repair      secondary to injury from a bungee cord  . Tonsillectomy    . Cardioversion N/A 05/08/2013    unsuccessful for persistent afib  . Tee without cardioversion N/A 06/24/2014    Procedure: TRANSESOPHAGEAL ECHOCARDIOGRAM (TEE);  Surgeon: Josue Hector, MD;  [facility];  Service: Cardiovascular;  Laterality: N/A;  . Cardioversion N/A 04/09/2015    Procedure: CARDIOVERSION;  Surgeon: Josue Hector, MD;  Location: [facility] ENDOSCOPY;  Service: Cardiovascular;  Laterality: N/A;     Current Outpatient Prescriptions  Medication Sig Dispense Refill  . methimazole (TAPAZOLE) 10 MG tablet Take 2 tablets (20 mg total) by mouth 2 (two) times daily. 360 tablet 3  . metoprolol (LOPRESSOR) 50 MG tablet Take 0.5 tablets (25 mg total) by mouth 2 (two) times daily. 180 tablet 2  . rivaroxaban (XARELTO) 20 MG TABS tablet Take 1 tablet by mouth  daily with supper 90 tablet 3   No current facility-administered medications for this visit.    Allergies:   Review of patient's allergies indicates no known  allergies.    Social History:  The patient  reports that he has quit smoking. He quit smokeless tobacco use about 13 years ago. He reports that he does not drink alcohol or use illicit drugs.   Family History:  The patient's family history includes Cancer (age of onset: 24) in his father; Diabetes in his brother; Hyperlipidemia in his mother; Thyroid disease in his mother.    ROS:  General:no colds or fevers, no weight changes Skin:no rashes or ulcers HEENT:no blurred vision, no congestion, no sore throat, swollen area bas of throat. CV:see HPI PUL:see HPI GI:no diarrhea constipation or melena, no indigestion GU:no hematuria, no dysuria MS:no joint pain, no claudication Neuro:no syncope, no lightheadedness Endo:no diabetes, + thyroid disease  Wt Readings from Last 3 Encounters:  05/12/15 273 lb (123.832 kg)  02/10/15 263 lb (119.296 kg)  02/04/15 257 lb  (116.574 kg)     PHYSICAL EXAM: VS:  BP 140/80 mmHg  Pulse 66  Ht '5\' 10"'$  (1.778 m)  Wt 273 lb (123.832 kg)  BMI 39.17 kg/m2 , BMI Body mass index is 39.17 kg/(m^2). General:Pleasant affect, NAD Skin:Warm and dry, brisk capillary refill HEENT:normocephalic, sclera clear, mucus membranes moist Neck:supple, no JVD, no bruits, + goiter, no adenopathy  Heart:S1S2 RRR without murmur, gallup, rub or click Lungs:clear without rales, rhonchi, or wheezes TIR:WERXV, soft, non tender, + BS, do not palpate liver spleen or masses Ext:no lower ext edema, 2+ pedal pulses, 2+ radial pulses Neuro:alert and oriented X 3, MAE, follows commands, + facial symmetry    EKG:  EKG is ordered today. The ekg ordered today demonstrates SR normal EKG.    Recent Labs: 02/04/2015: TSH 8.30* 04/08/2015: ALT 17; BUN 21; Creatinine, Ser 1.27; Hemoglobin 15.7; Platelets 138.0*; Potassium 4.5; Sodium 139    Lipid Panel    Component Value Date/Time   CHOL 126 02/13/2012 1120   TRIG 111.0 02/13/2012 1120   HDL 32.70* 02/13/2012 1120   CHOLHDL 4 02/13/2012 1120   VLDL 22.2 02/13/2012 1120   LDLCALC 71 02/13/2012 1120       Other studies Reviewed: Additional studies/ records that were reviewed today include: noted thrombocytopenia on last CBC.   ASSESSMENT AND PLAN:  1.  Persistent a fib now with DCCV to SR and maintaining. Follow up with Dr. Johnsie Cancel in 6 weeks.   2. Anticoagulation on Xarelto for CHA2DS2VASc score of 1.   3. HTN, controlled  4. Grave's disease, now with goiter, to see Dr. Loanne Drilling tomorrow.  Will check thyroid today along with CBC for thrombocytopenia and BMP   Current medicines are reviewed with the patient today.  The patient Has no concerns regarding medicines.  The following changes have been made:  See above Labs/ tests ordered today include:see above  Disposition:   FU:  see above  Signed, Isaiah Serge, NP  05/12/2015 9:51 AM    [facility] [place], [place], [place], [place] Phone: [phone]; Fax: [phone]

## 2015-05-13 ENCOUNTER — Ambulatory Visit (INDEPENDENT_AMBULATORY_CARE_PROVIDER_SITE_OTHER): Payer: PRIVATE HEALTH INSURANCE | Admitting: Endocrinology

## 2015-05-13 ENCOUNTER — Encounter: Payer: Self-pay | Admitting: Endocrinology

## 2015-05-13 VITALS — BP 108/64 | HR 68 | Temp 97.8°F | Resp 12 | Wt 274.0 lb

## 2015-05-13 DIAGNOSIS — E0590 Thyrotoxicosis, unspecified without thyrotoxic crisis or storm: Secondary | ICD-10-CM | POA: Diagnosis not present

## 2015-05-13 MED ORDER — METHIMAZOLE 10 MG PO TABS: 10.0000 mg | ORAL_TABLET | Freq: Two times a day (BID) | ORAL | Status: DC

## 2015-05-13 NOTE — Progress Notes (Signed)
Subjective:    Patient ID: [name], male    DOB: [birth year], 59 y.o.   MRN: [id number]  HPI Pt returns for f/u of hyperthyroidism (dx'ed 2015; as he had AF, tapazole rx was chosen, due to the need for prompt improvement in his TFT; he has never had thyroid imaging; he has never been on amiodarone).  Pt states slight swelling at the anterior neck, but no assoc pain.   Past Medical History  Diagnosis Date  . HTN (hypertension)     pt unaware of this  . Persistent atrial fibrillation     now SR after DCCV   . Obesity   . H/O hiatal hernia   . Moderate mitral regurgitation   . Hyperthyroidism     treated with tapazole    Past Surgical History  Procedure Laterality Date  . Lower back surgery      herniated nucleus pulposus left L5-S1; semihemilaminectomy and diskectomy with microdissection with microscope  . Right cornea repair      secondary to injury from a bungee cord  . Tonsillectomy    . Cardioversion N/A 05/08/2013    unsuccessful for persistent afib  . Tee without cardioversion N/A 06/24/2014    Procedure: TRANSESOPHAGEAL ECHOCARDIOGRAM (TEE);  Surgeon: Josue Hector, MD;  Location: [place];  Service: Cardiovascular;  Laterality: N/A;  . Cardioversion N/A 04/09/2015    Procedure: CARDIOVERSION;  Surgeon: Josue Hector, MD;  Location: [facility] ENDOSCOPY;  Service: Cardiovascular;  Laterality: N/A;    Social History   Social History  . Marital Status: Married    Spouse Name: N/A  . Number of Children: N/A  . 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 Smoking status: Former Research scientist (life sciences)  . Smokeless tobacco: Former Systems developer    Quit date: 05/08/2002  . Alcohol Use: No  . Drug Use: No  . Sexual Activity: Not Currently   Other Topics Concern  . 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 metoprolol (LOPRESSOR) 50 MG tablet Take 0.5 tablets (25 mg total) by mouth 2 (two) times daily. 180 tablet 2  . rivaroxaban (XARELTO) 20 MG TABS tablet Take 1 tablet by mouth  daily with supper 90 tablet 3   No current facility-administered medications on file prior to visit.    No Known Allergies  Family History  Problem Relation Age of Onset  . Diabetes Brother   . Hyperlipidemia Mother   . Cancer Father 72    cancer unknown primary  . Thyroid disease Mother     BP 108/64 mmHg  Pulse 68  Temp(Src) 97.8 F (36.6 C) (Oral)  Resp 12  Wt 274 lb (124.286 kg)  SpO2 95%  Review of Systems Denies fever.  He has gained weight.      Objective:   Physical Exam VITAL SIGNS:  See vs page. GENERAL: no distress.   NECK: Thyroid is 5-10 times normal size, diffuse.  No thyroid nodule is palpable.  No palpable lymphadenopathy at the anterior neck.     Lab Results  Component Value Date   TSH 23.27* 05/12/2015      Assessment & Plan:  Hyperthyroidism: overcontrol is worse. Neck swelling, new.    Patient is advised the following: Patient Instructions  Please stop the methimazole for 2 weeks, then resume at 1 pill, twice a day.  if ever you have fever while taking methimazole, stop it and call us, because of the risk of a rare side-effect.   Please come back for a follow-up appointment in 6 weeks.   Let's check the thyroid ultrasound.  you will receive a phone call, about a day and time for an appointment.

## 2015-05-13 NOTE — Patient Instructions (Addendum)
Please stop the methimazole for 2 weeks, then resume at 1 pill, twice a day.  if ever you have fever while taking methimazole, stop it and call us, because of the risk of a rare side-effect.   Please come back for a follow-up appointment in 6 weeks.   Let's check the thyroid ultrasound.  you will receive a phone call, about a day and time for an appointment.

## 2015-05-15 ENCOUNTER — Telehealth: Payer: Self-pay | Admitting: Endocrinology

## 2015-05-15 NOTE — Telephone Encounter (Signed)
[facility] note dated 05/14/15/ 5:06 PM Caller states she is Conservation officer, historic buildings from the [employer]. She is calling to get orders to patient to have his ultrasound done. He has an appt at 1030 am at [facility] but the imaging facility needs an order prior to his arrival CB# [phone], the fax number for [facility] is [phone]. Call back waiting for page, she decided that it was not necessary to have an on call paged she stated she would call back in the AM

## 2015-05-15 NOTE — Telephone Encounter (Signed)
Order have been faxed to [facility].

## 2015-05-18 ENCOUNTER — Telehealth: Payer: Self-pay | Admitting: Endocrinology

## 2015-05-18 NOTE — Telephone Encounter (Signed)
please call patient: Thyroid US shows an imflamed thyroid, which we already knew.  No treatment is needed for this, except what we are doing. i'll see you next time.

## 2015-05-19 NOTE — Telephone Encounter (Signed)
Pt advised of note below and voiced understanding.  

## 2015-06-15 ENCOUNTER — Encounter: Payer: Self-pay | Admitting: Endocrinology

## 2015-07-08 NOTE — Progress Notes (Signed)
Patient ID: [name], male   DOB: 12-02-1955, 59 y.o.   MRN: [id number]    Cardiology Office Note   Date:  07/10/2015   ID:  Jason, Knapp 13-Dec-1955, MRN [id number]  PCP:  [name], MD  Cardiologist:  Dr. Johnsie Cancel    No chief complaint on file.     History of Present Illness: [name] is a 59 y.o. male who presents for post DCCV for  A fib.  He underwent DCCV 04/09/15.    He has a hx of persistent a fib and hyperthyroid with  DCCV by Dr Acie Fredrickson 05/08/13 without success. ( [place] 120 200,200) On flecainide  Echo with mild LAE and normal EF Normal ETT on 7/14 with no history of CAD Flecainide stopped Seen by Dr Rayann Heman and no ablation   No objective signs of CHF CXR normlal and reviewed TSH supressed at .008 -treated and most recent in June was 8.30              Post DCCV  With 120 J, to SR at 54, BB was decreased to 25 mg BID.  Continues on Xarelto.    He only complains of his thyroid, now with fatigue and complaints of difficulty moving his neck especially forward.  This occurred last week.    He asks that we do labs today.  On lab review his plts were low prior to DCCV.  Pt without change in symptoms since DCCV, he could not tell he was in a fib.   Since control of thyroid he has gained wt.  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  He does not have time to exercise.  encouraged healthy diet and exercise.    Denies chest pain or SOB.    Past Medical History  Diagnosis Date  . HTN (hypertension)     pt unaware of this  . Persistent atrial fibrillation ([place])     now SR after DCCV   . Obesity   . H/O hiatal hernia   . Moderate mitral regurgitation   . Hyperthyroidism     treated with tapazole    Past Surgical History  Procedure Laterality Date  . Lower back surgery      herniated nucleus pulposus left L5-S1; semihemilaminectomy and diskectomy with microdissection with microscope  . Right  cornea repair      secondary to injury from a bungee cord  . Tonsillectomy    . Cardioversion N/A 05/08/2013    unsuccessful for persistent afib  . Tee without cardioversion N/A 06/24/2014    Procedure: TRANSESOPHAGEAL ECHOCARDIOGRAM (TEE);  Surgeon: Josue Hector, MD;  Location: [facility] ENDOSCOPY;  Service: Cardiovascular;  Laterality: N/A;  . Cardioversion N/A 04/09/2015    Procedure: CARDIOVERSION;  Surgeon: Josue Hector, MD;  Location: [facility] ENDOSCOPY;  Service: Cardiovascular;  Laterality: N/A;     Current Outpatient Prescriptions  Medication Sig Dispense Refill  . methimazole (TAPAZOLE) 10 MG tablet Take 1 tablet (10 mg total) by mouth daily. 90 tablet 1  . metoprolol (LOPRESSOR) 50 MG tablet Take 0.5 tablets (25 mg total) by mouth 2 (two) times daily. 180 tablet 2  . rivaroxaban (XARELTO) 20 MG TABS tablet Take 1 tablet by mouth  daily with supper 90 tablet 3   No current facility-administered medications for this visit.    Allergies:   Review of patient's allergies indicates no known allergies.  Social History:  The patient  reports that he has quit smoking. He quit smokeless tobacco use about 13 years ago. He reports that he does not drink alcohol or use illicit drugs.   Family History:  The patient's family history includes Cancer (age of onset: 44) in his father; Diabetes in his brother; Hyperlipidemia in his mother; Thyroid disease in his mother.    ROS:  General:no colds or fevers, no weight changes Skin:no rashes or ulcers HEENT:no blurred vision, no congestion, no sore throat, swollen area bas of throat. CV:see HPI   PUL:see HPI GI:no diarrhea constipation or melena, no indigestion GU:no hematuria, no dysuria MS:no joint pain, no claudication Neuro:no syncope, no lightheadedness Endo:no diabetes, + thyroid disease  Wt Readings from Last 3 Encounters:  07/10/15 122.925 kg (271 lb)  07/09/15 122.925 kg (271 lb)  05/13/15 124.286 kg (274 lb)     PHYSICAL EXAM: VS:   BP 110/64 mmHg  Pulse 59  Resp 11  Ht '5\' 9"'$  (1.753 m)  Wt 122.925 kg (271 lb)  BMI 40.00 kg/m2 , BMI Body mass index is 40 kg/(m^2). General:Pleasant affect, NAD Skin:Warm and dry, brisk capillary refill HEENT:normocephalic, sclera clear, mucus membranes moist Neck:supple, no JVD, left  bruits, + goiter, no adenopathy  Heart:S1S2 RRR without murmur, gallup, rub or click Lungs:clear without rales, rhonchi, or wheezes ZOX:WRUEA, soft, non tender, + BS, do not palpate liver spleen or masses Ext:no lower ext edema, 2+ pedal pulses, 2+ radial pulses Neuro:alert and oriented X 3, MAE, follows commands, + facial symmetry    EKG:  EKG is ordered today. The ekg ordered today demonstrates SR normal EKG.    Recent Labs: 04/08/2015: ALT 17 05/12/2015: BUN 19; Creatinine, Ser 1.37; Hemoglobin 14.8; Platelets 127.0*; Potassium 4.2; Sodium 139 07/09/2015: TSH 17.53*    Lipid Panel    Component Value Date/Time   CHOL 126 02/13/2012 1120   TRIG 111.0 02/13/2012 1120   HDL 32.70* 02/13/2012 1120   CHOLHDL 4 02/13/2012 1120   VLDL 22.2 02/13/2012 1120   LDLCALC 71 02/13/2012 1120     Lab Results  Component Value Date   TSH 17.53* 07/09/2015      Other studies Reviewed: Additional studies/ records that were reviewed today include: noted thrombocytopenia on last CBC.   ASSESSMENT AND PLAN:  1.  Afib:   now with DCCV to SR and maintaining. Stop xarelto and start ASA  2. Anticoagulation since euthyroid and CHADVASC only 1 can stop anticoagulation   3. HTN, controlled  4. Thyroid f/u Loanne Drilling should decrease methimizole to one pill / day   5. Bruit:  Left check duplex     Current medicines are reviewed with the patient today.  The patient Has no concerns regarding medicines.    Jenkins Rouge

## 2015-07-09 ENCOUNTER — Encounter: Payer: Self-pay | Admitting: Endocrinology

## 2015-07-09 ENCOUNTER — Ambulatory Visit (INDEPENDENT_AMBULATORY_CARE_PROVIDER_SITE_OTHER): Payer: PRIVATE HEALTH INSURANCE | Admitting: Endocrinology

## 2015-07-09 VITALS — BP 126/82 | HR 61 | Temp 97.5°F | Ht 70.0 in | Wt 271.0 lb

## 2015-07-09 DIAGNOSIS — E0590 Thyrotoxicosis, unspecified without thyrotoxic crisis or storm: Secondary | ICD-10-CM | POA: Diagnosis not present

## 2015-07-09 LAB — T4, FREE: Free T4: 0 ng/dL — ABNORMAL LOW (ref 0.60–1.60)

## 2015-07-09 LAB — TSH: TSH: 17.53 u[IU]/mL — AB (ref 0.35–4.50)

## 2015-07-09 MED ORDER — METHIMAZOLE 10 MG PO TABS: 10.0000 mg | ORAL_TABLET | Freq: Every day | ORAL | Status: DC

## 2015-07-09 NOTE — Progress Notes (Signed)
Subjective:    Patient ID: [name], male    DOB: 1955-09-18, 59 y.o.   MRN: [id number]  HPI Pt returns for f/u of hyperthyroidism (dx'ed 2015; as he had AF, tapazole rx was chosen, due to the need for prompt improvement in his TFT; he has never had thyroid imaging; he has never been on amiodarone).  He takes tapazole as rx'ed.  Denies palpitations Past Medical History  Diagnosis Date  . HTN (hypertension)     pt unaware of this  . Persistent atrial fibrillation ([place])     now SR after DCCV   . Obesity   . H/O hiatal hernia   . Moderate mitral regurgitation   . Hyperthyroidism     treated with tapazole    Past Surgical History  Procedure Laterality Date  . Lower back surgery      herniated nucleus pulposus left L5-S1; semihemilaminectomy and diskectomy with microdissection with microscope  . Right cornea repair      secondary to injury from a bungee cord  . Tonsillectomy    . Cardioversion N/A 05/08/2013    unsuccessful for persistent afib  . Tee without cardioversion N/A 06/24/2014    Procedure: TRANSESOPHAGEAL ECHOCARDIOGRAM (TEE);  Surgeon: Josue Hector, MD;  Location: [place];  Service: Cardiovascular;  Laterality: N/A;  . Cardioversion N/A 04/09/2015    Procedure: CARDIOVERSION;  Surgeon: Josue Hector, MD;  Location: [facility] ENDOSCOPY;  Service: Cardiovascular;  Laterality: N/A;    Social History   Social History  . Marital Status: Married    Spouse Name: N/A  . Number of Children: N/A  . 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 Smoking status: Former Research scientist (life sciences)  . Smokeless tobacco: Former Systems developer    Quit date: 05/08/2002  . Alcohol Use: No  . Drug Use: No  . Sexual Activity: Not Currently   Other Topics Concern  . 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  metoprolol (LOPRESSOR) 50 MG tablet Take 0.5 tablets (25 mg total) by mouth 2 (two) times daily. 180 tablet 2  . rivaroxaban (XARELTO) 20 MG TABS tablet Take 1 tablet by mouth  daily with supper 90 tablet 3   No current facility-administered medications on file prior to visit.    No Known Allergies  Family History  Problem Relation Age of Onset  . Diabetes Brother   . Hyperlipidemia Mother   . Cancer Father 29    cancer unknown primary  . Thyroid disease Mother     BP 126/82 mmHg  Pulse 61  Temp(Src) 97.5 F (36.4 C) (Oral)  Ht '5\' 10"'$  (1.778 m)  Wt 271 lb (122.925 kg)  BMI 38.88 kg/m2  SpO2 97%    Review of Systems Denies fever    Objective:   Physical Exam VITAL SIGNS:  See vs page GENERAL: no distress NECK: Thyroid is 5-10 times normal size, diffuse.  No thyroid nodule is palpable.  No palpable lymphadenopathy at the anterior neck.   Skin: not diaphoretic Neuro: no tremor   Lab Results  Component Value Date   TSH 17.53* 07/09/2015      Assessment & Plan:  Hyperthyroidism: still overcontrolled.  i hesitate to d/c the tapazole altogether for 2 reasons: 1 is the  large size of the goiter.  The other is that TSH is decreasing  Patient is advised the following: Patient Instructions  blood tests are requested for you today.  We'll let you know about the results. if ever you have fever while taking methimazole, stop it and call us, even if the reason is obvious, because of the risk of a rare side-effect.   Please come back for a follow-up appointment in 3 months.   addendum: Please stop taking the thyroid pill. In 1 week, resume at 1 pill per day. Please come back for a follow-up appointment in 1 month, rather than 3.

## 2015-07-09 NOTE — Patient Instructions (Addendum)
blood tests are requested for you today.  We'll let you know about the results. if ever you have fever while taking methimazole, stop it and call us, even if the reason is obvious, because of the risk of a rare side-effect.   Please come back for a follow-up appointment in 3 months.

## 2015-07-10 ENCOUNTER — Ambulatory Visit (INDEPENDENT_AMBULATORY_CARE_PROVIDER_SITE_OTHER): Payer: PRIVATE HEALTH INSURANCE | Admitting: Cardiovascular Disease

## 2015-07-10 ENCOUNTER — Encounter: Payer: Self-pay | Admitting: Cardiovascular Disease

## 2015-07-10 ENCOUNTER — Encounter: Payer: Self-pay | Admitting: *Deleted

## 2015-07-10 VITALS — BP 110/64 | HR 59 | Resp 11 | Ht 69.0 in | Wt 271.0 lb

## 2015-07-10 DIAGNOSIS — Z23 Encounter for immunization: Secondary | ICD-10-CM | POA: Diagnosis not present

## 2015-07-10 DIAGNOSIS — R0989 Other specified symptoms and signs involving the circulatory and respiratory systems: Secondary | ICD-10-CM | POA: Diagnosis not present

## 2015-07-10 DIAGNOSIS — I480 Paroxysmal atrial fibrillation: Secondary | ICD-10-CM | POA: Diagnosis not present

## 2015-07-10 DIAGNOSIS — I340 Nonrheumatic mitral (valve) insufficiency: Secondary | ICD-10-CM

## 2015-07-10 NOTE — Patient Instructions (Addendum)
Medication Instructions:  Your physician recommends that you continue on your current medications as directed. Please refer to the Current Medication list given to you today.   Labwork: NONE  Testing/Procedures: Your physician has requested that you have a carotid duplex. This test is an ultrasound of the carotid arteries in your neck. It looks at blood flow through these arteries that supply the brain with blood. Allow one hour for this exam. There are no restrictions or special instructions.   Follow-Up: Your physician wants you to follow-up in:   [place] will receive a reminder letter in the mail two months in advance. If you don't receive a letter, please call our office to schedule the follow-up appointment.  Any Other Special Instructions Will Be Listed Below (If Applicable).     If you need a refill on your cardiac medications before your next appointment, please call your pharmacy. \

## 2015-08-24 ENCOUNTER — Ambulatory Visit (INDEPENDENT_AMBULATORY_CARE_PROVIDER_SITE_OTHER): Payer: PRIVATE HEALTH INSURANCE | Admitting: Endocrinology

## 2015-08-24 ENCOUNTER — Ambulatory Visit: Payer: PRIVATE HEALTH INSURANCE | Admitting: Endocrinology

## 2015-08-24 ENCOUNTER — Encounter: Payer: Self-pay | Admitting: Endocrinology

## 2015-08-24 VITALS — BP 132/82 | HR 69 | Temp 97.7°F | Ht 69.0 in | Wt 273.0 lb

## 2015-08-24 DIAGNOSIS — E0590 Thyrotoxicosis, unspecified without thyrotoxic crisis or storm: Secondary | ICD-10-CM

## 2015-08-24 LAB — T4, FREE: FREE T4: 0.13 ng/dL — AB (ref 0.60–1.60)

## 2015-08-24 LAB — TSH: TSH: 19.74 u[IU]/mL — AB (ref 0.35–4.50)

## 2015-08-24 NOTE — Progress Notes (Signed)
Subjective:    Patient ID: [name], male    DOB: April 24, 1956, 59 y.o.   MRN: [id number]  HPI Pt returns for f/u of hyperthyroidism (dx'ed 2015; as he had AF, tapazole rx was chosen, due to the need for prompt improvement in his TFT; he has never had thyroid imaging; he has never been on amiodarone).  He takes tapazole as rx'ed.  Denies palpitations.   Past Medical History  Diagnosis Date  . HTN (hypertension)     pt unaware of this  . Persistent atrial fibrillation ([place])     now SR after DCCV   . Obesity   . H/O hiatal hernia   . Moderate mitral regurgitation   . Hyperthyroidism     treated with tapazole    Past Surgical History  Procedure Laterality Date  . Lower back surgery      herniated nucleus pulposus left L5-S1; semihemilaminectomy and diskectomy with microdissection with microscope  . Right cornea repair      secondary to injury from a bungee cord  . Tonsillectomy    . Cardioversion N/A 05/08/2013    unsuccessful for persistent afib  . Tee without cardioversion N/A 06/24/2014    Procedure: TRANSESOPHAGEAL ECHOCARDIOGRAM (TEE);  Surgeon: Josue Hector, MD;  Location: [place];  Service: Cardiovascular;  Laterality: N/A;  . Cardioversion N/A 04/09/2015    Procedure: CARDIOVERSION;  Surgeon: Josue Hector, MD;  Location: [facility] ENDOSCOPY;  Service: Cardiovascular;  Laterality: N/A;    Social History   Social History  . Marital Status: Married    Spouse Name: N/A  . Number of Children: N/A  . 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 Smoking status: Former Research scientist (life sciences)  . Smokeless tobacco: Former Systems developer    Quit date: 05/08/2002  . Alcohol Use: No  . Drug Use: No  . Sexual Activity: Not Currently   Other Topics Concern  . 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  methimazole (TAPAZOLE) 10 MG tablet Take 1 tablet (10 mg total) by mouth daily. 90 tablet 1  . metoprolol (LOPRESSOR) 50 MG tablet Take 0.5 tablets (25 mg total) by mouth 2 (two) times daily. 180 tablet 2  . rivaroxaban (XARELTO) 20 MG TABS tablet Take 1 tablet by mouth  daily with supper (Patient not taking: Reported on 08/24/2015) 90 tablet 3   No current facility-administered medications on file prior to visit.    No Known Allergies  Family History  Problem Relation Age of Onset  . Diabetes Brother   . Hyperlipidemia Mother   . Cancer Father 55    cancer unknown primary  . Thyroid disease Mother     BP 132/82 mmHg  Pulse 69  Temp(Src) 97.7 F (36.5 C) (Oral)  Ht '5\' 9"'$  (1.753 m)  Wt 273 lb (123.832 kg)  BMI 40.30 kg/m2  SpO2 97%  Review of Systems Denies fever    Objective:   Physical Exam VITAL SIGNS:  See vs page.   GENERAL: no distress.  NECK: Thyroid is 5-10 times normal size, diffuse.  No thyroid nodule is palpable.  No palpable lymphadenopathy at the anterior neck.   Skin: not diaphoretic.  Neuro: no tremor.     Lab Results  Component Value Date  TSH 19.74* 08/24/2015      Assessment & Plan:  Hyperthyroidism: still overcontrolled.    Patient is advised the following: Patient Instructions  blood tests are requested for you today.  We'll let you know about the results.  if ever you have fever while taking methimazole, stop it and call us, even if the reason is obvious, because of the risk of a rare side-effect.   Please come back for a follow-up appointment in 2 months.   addendum: d/c tapazole.  Recheck TFT in 2 weeks.

## 2015-08-24 NOTE — Patient Instructions (Addendum)
blood tests are requested for you today.  We'll let you know about the results.  if ever you have fever while taking methimazole, stop it and call us, even if the reason is obvious, because of the risk of a rare side-effect.   Please come back for a follow-up appointment in 2 months.

## 2015-08-28 ENCOUNTER — Ambulatory Visit (HOSPITAL_COMMUNITY)
Admission: RE | Admit: 2015-08-28 | Discharge: 2015-08-28 | Disposition: A | Discharge status: Home / Self Care | Payer: PRIVATE HEALTH INSURANCE | Source: Ambulatory Visit | Attending: Cardiovascular Disease | Admitting: Cardiovascular Disease

## 2015-08-28 DIAGNOSIS — I10 Essential (primary) hypertension: Secondary | ICD-10-CM | POA: Insufficient documentation

## 2015-08-28 DIAGNOSIS — R0989 Other specified symptoms and signs involving the circulatory and respiratory systems: Secondary | ICD-10-CM | POA: Diagnosis not present

## 2015-08-28 DIAGNOSIS — I6523 Occlusion and stenosis of bilateral carotid arteries: Secondary | ICD-10-CM | POA: Insufficient documentation

## 2015-09-04 ENCOUNTER — Other Ambulatory Visit (INDEPENDENT_AMBULATORY_CARE_PROVIDER_SITE_OTHER): Payer: PRIVATE HEALTH INSURANCE

## 2015-09-04 DIAGNOSIS — E0590 Thyrotoxicosis, unspecified without thyrotoxic crisis or storm: Secondary | ICD-10-CM

## 2015-09-04 LAB — TSH: TSH: 1.37 u[IU]/mL (ref 0.35–4.50)

## 2015-09-04 LAB — T4, FREE: FREE T4: 0.87 ng/dL (ref 0.60–1.60)

## 2015-09-06 ENCOUNTER — Other Ambulatory Visit: Payer: Self-pay | Admitting: Endocrinology

## 2015-09-06 MED ORDER — METHIMAZOLE 5 MG PO TABS: 5.0000 mg | ORAL_TABLET | Freq: Every day | ORAL | Status: AC

## 2015-10-12 ENCOUNTER — Ambulatory Visit: Payer: PRIVATE HEALTH INSURANCE | Admitting: Endocrinology

## 2015-10-29 ENCOUNTER — Encounter: Payer: Self-pay | Admitting: Endocrinology

## 2015-10-29 ENCOUNTER — Ambulatory Visit (INDEPENDENT_AMBULATORY_CARE_PROVIDER_SITE_OTHER): Payer: No Typology Code available for payment source | Admitting: Endocrinology

## 2015-10-29 VITALS — BP 122/70 | HR 76 | Temp 97.9°F | Wt 263.0 lb

## 2015-10-29 DIAGNOSIS — E0590 Thyrotoxicosis, unspecified without thyrotoxic crisis or storm: Secondary | ICD-10-CM

## 2015-10-29 LAB — T4, FREE: Free T4: 0.77 ng/dL (ref 0.60–1.60)

## 2015-10-29 LAB — TSH: TSH: 2.74 u[IU]/mL (ref 0.35–4.50)

## 2015-10-29 NOTE — Progress Notes (Signed)
Subjective:    Patient ID: [name], male    DOB: 04/04/1956, 60 y.o.   MRN: [id number]  HPI Pt returns for f/u of hyperthyroidism (dx'ed 2015; as he had AF, tapazole rx was chosen, due to the need for prompt improvement in his TFT; he has never had thyroid imaging; he has never been on amiodarone; in late 2016, tapazole was stopped, due to high TSH; it was resumed soon therafter, at a lower dosage).  He takes tapazole as rx'ed.  Denies palpitations.   Past Medical History  Diagnosis Date  . HTN (hypertension)     pt unaware of this  . Persistent atrial fibrillation ([place])     now SR after DCCV   . Obesity   . H/O hiatal hernia   . Moderate mitral regurgitation   . Hyperthyroidism     treated with tapazole    Past Surgical History  Procedure Laterality Date  . Lower back surgery      herniated nucleus pulposus left L5-S1; semihemilaminectomy and diskectomy with microdissection with microscope  . Right cornea repair      secondary to injury from a bungee cord  . Tonsillectomy    . Cardioversion N/A 05/08/2013    unsuccessful for persistent afib  . Tee without cardioversion N/A 06/24/2014    Procedure: TRANSESOPHAGEAL ECHOCARDIOGRAM (TEE);  Surgeon: Josue Hector, MD;  Location: [place];  Service: Cardiovascular;  Laterality: N/A;  . Cardioversion N/A 04/09/2015    Procedure: CARDIOVERSION;  Surgeon: Josue Hector, MD;  Location: [facility] ENDOSCOPY;  Service: Cardiovascular;  Laterality: N/A;    Social History   Social History  . Marital Status: Married    Spouse Name: N/A  . Number of Children: N/A  . 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 Smoking status: Former Research scientist (life sciences)  . Smokeless tobacco: Former Systems developer    Quit date: 05/08/2002  . Alcohol Use: No  . Drug Use: No  . Sexual Activity: Not Currently   Other Topics Concern  . 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 methimazole (TAPAZOLE) 5 MG tablet Take 1 tablet (5 mg total) by mouth daily. 90 tablet 0  . metoprolol (LOPRESSOR) 50 MG tablet Take 0.5 tablets (25 mg total) by mouth 2 (two) times daily. 180 tablet 2   No current facility-administered medications on file prior to visit.    No Known Allergies  Family History  Problem Relation Age of Onset  . Diabetes Brother   . Hyperlipidemia Mother   . Cancer Father 6    cancer unknown primary  . Thyroid disease Mother     BP 122/70 mmHg  Pulse 76  Temp(Src) 97.9 F (36.6 C) (Oral)  Wt 263 lb (119.296 kg)  SpO2 97%  Review of Systems Denies fever.     Objective:   Physical Exam VITAL SIGNS:  See vs page.  GENERAL: no distress. NECK: Thyroid is 5-10 times normal size, diffuse.  No thyroid nodule is palpable.  No palpable lymphadenopathy at the anterior neck.  Skin: not diaphoretic.  Neuro: no tremor.    Lab Results  Component Value Date   TSH 2.74 10/29/2015      Assessment & Plan:  Hyperthyroidism: well-controlled  Patient is advised  the following: Patient Instructions  blood tests are requested for you today.  We'll let you know about the results.  if ever you have fever while taking methimazole, stop it and call us, even if the reason is obvious, because of the risk of a rare side-effect.   Please come back for a follow-up appointment in 2 months.    addendum: Please continue the same medication

## 2015-10-29 NOTE — Patient Instructions (Signed)
blood tests are requested for you today.  We'll let you know about the results.  if ever you have fever while taking methimazole, stop it and call us, even if the reason is obvious, because of the risk of a rare side-effect.   Please come back for a follow-up appointment in 2 months.

## 2015-12-28 ENCOUNTER — Ambulatory Visit (INDEPENDENT_AMBULATORY_CARE_PROVIDER_SITE_OTHER): Payer: PRIVATE HEALTH INSURANCE | Admitting: Endocrinology

## 2015-12-28 ENCOUNTER — Ambulatory Visit
Admission: RE | Admit: 2015-12-28 | Discharge: 2015-12-28 | Disposition: A | Discharge status: Home / Self Care | Payer: PRIVATE HEALTH INSURANCE | Source: Ambulatory Visit | Attending: Endocrinology | Admitting: Endocrinology

## 2015-12-28 ENCOUNTER — Encounter: Payer: Self-pay | Admitting: Endocrinology

## 2015-12-28 VITALS — BP 122/70 | HR 99 | Temp 97.6°F | Ht 69.0 in | Wt 262.0 lb

## 2015-12-28 DIAGNOSIS — R918 Other nonspecific abnormal finding of lung field: Secondary | ICD-10-CM | POA: Diagnosis not present

## 2015-12-28 DIAGNOSIS — E0590 Thyrotoxicosis, unspecified without thyrotoxic crisis or storm: Secondary | ICD-10-CM

## 2015-12-28 DIAGNOSIS — R05 Cough: Secondary | ICD-10-CM

## 2015-12-28 DIAGNOSIS — R059 Cough, unspecified: Secondary | ICD-10-CM

## 2015-12-28 DIAGNOSIS — I10 Essential (primary) hypertension: Secondary | ICD-10-CM | POA: Diagnosis not present

## 2015-12-28 LAB — BASIC METABOLIC PANEL
BUN: 15 mg/dL (ref 6–23)
CHLORIDE: 101 meq/L (ref 96–112)
CO2: 28 meq/L (ref 19–32)
CREATININE: 0.93 mg/dL (ref 0.40–1.50)
Calcium: 9.1 mg/dL (ref 8.4–10.5)
GFR: 88.51 mL/min (ref >60.00)
Glucose, Bld: 189 mg/dL — ABNORMAL HIGH (ref 70–99)
POTASSIUM: 3.8 meq/L (ref 3.5–5.1)
SODIUM: 136 meq/L (ref 135–145)

## 2015-12-28 LAB — T4, FREE: FREE T4: 0.85 ng/dL (ref 0.60–1.60)

## 2015-12-28 LAB — TSH: TSH: 2.39 u[IU]/mL (ref 0.35–4.50)

## 2015-12-28 MED ORDER — OMEPRAZOLE 40 MG PO CPDR
40.0000 mg | DELAYED_RELEASE_CAPSULE | Freq: Every day | ORAL | Status: DC
Start: 2015-12-28 — End: 2016-01-13

## 2015-12-28 MED ORDER — FLUTICASONE-SALMETEROL 100-50 MCG/DOSE IN AEPB: 1.0000 | INHALATION_SPRAY | Freq: Two times a day (BID) | RESPIRATORY_TRACT | Status: DC

## 2015-12-28 MED ORDER — PROMETHAZINE-CODEINE 6.25-10 MG/5ML PO SYRP: 5.0000 mL | ORAL_SOLUTION | ORAL | Status: DC | PRN

## 2015-12-28 NOTE — Progress Notes (Signed)
Subjective:    Patient ID: [name], male    DOB: 1956/03/09, 60 y.o.   MRN: [id number]  HPI Pt returns for f/u of hyperthyroidism (dx'ed 2015; as he had AF, tapazole rx was chosen, due to the need for prompt improvement in his TFT; he has never had thyroid imaging; he has never been on amiodarone; in late 2016, tapazole was stopped, due to high TSH; it was resumed soon therafter, at a lower dosage).   Pt states 6 weeks of moderate prod-quality cough in the chest, and assoc pain.  He was seen at UC 2 weeks ago, and rx'ed zithromax and tessalon.  No improvement since then.  He has slight rhinorrhea. Past Medical History  Diagnosis Date  . HTN (hypertension)     pt unaware of this  . Persistent atrial fibrillation ([place])     now SR after DCCV   . Obesity   . H/O hiatal hernia   . Moderate mitral regurgitation   . Hyperthyroidism     treated with tapazole    Past Surgical History  Procedure Laterality Date  . Lower back surgery      herniated nucleus pulposus left L5-S1; semihemilaminectomy and diskectomy with microdissection with microscope  . Right cornea repair      secondary to injury from a bungee cord  . Tonsillectomy    . Cardioversion N/A 05/08/2013    unsuccessful for persistent afib  . Tee without cardioversion N/A 06/24/2014    Procedure: TRANSESOPHAGEAL ECHOCARDIOGRAM (TEE);  Surgeon: Josue Hector, MD;  Location: [facility];  Service: Cardiovascular;  Laterality: N/A;  . Cardioversion N/A 04/09/2015    Procedure: CARDIOVERSION;  Surgeon: Josue Hector, MD;  Location: [facility];  Service: Cardiovascular;  Laterality: N/A;    Social History   Social History  . Marital Status: Married    Spouse Name: N/A  . Number of Children: N/A  . 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 Smoking status: Former Research scientist (life sciences)  . Smokeless tobacco: Former Systems developer    Quit date: 05/08/2002  . Alcohol Use: No  . Drug Use: No    . Sexual Activity: Not Currently   Other Topics Concern  . Not on file   Social History Narrative   Pt lives in [place] with spouse.  Truck driver    Current Outpatient Prescriptions on File Prior to Visit  Medication Sig Dispense Refill  . methimazole (TAPAZOLE) 5 MG tablet Take 1 tablet (5 mg total) by mouth daily. 90 tablet 0  . metoprolol (LOPRESSOR) 50 MG tablet Take 0.5 tablets (25 mg total) by mouth 2 (two) times daily. 180 tablet 2   No current facility-administered medications on file prior to visit.    No Known Allergies  Family History  Problem Relation Age of Onset  . Diabetes Brother   . Hyperlipidemia Mother   . Cancer Father 85    cancer unknown primary  . Thyroid disease Mother     BP 122/70 mmHg  Pulse 99  Temp(Src) 97.6 F (36.4 C) (Oral)  Ht '5\' 9"'$  (1.753 m)  Wt 262 lb (118.842 kg)  BMI 38.67 kg/m2  SpO2 93%  Review of Systems Denies sob and fever.    Objective:   Physical Exam VITAL SIGNS:  See vs page GENERAL: no distress NECK: Thyroid is 5-10 times normal size, diffuse.  No thyroid nodule is palpable.  No palpable lymphadenopathy at  the anterior neck LUNGS:  Clear to auscultation  Skin: not diaphoretic.  Neuro: no tremor.   Lab Results  Component Value Date   TSH 2.39 12/28/2015  CXR: new lung mass, and several other abnormalities    Assessment & Plan:  Hyperthyroidism: well-controlled Lung mass, new, uncertain etiology   Patient is advised the following: Patient Instructions  blood tests and a chest x-ray are requested for you today.  We'll let you know about the results.   Here is a prescription for cough syrup.   i have sent a prescription to your pharmacy: for an inhaler and against heartburn (sometimes heartburn masquerades as a cough).  I hope you feel better soon.  If you don't feel better by next week, please call Dr [name].  Please call her sooner if you get worse.  if ever you have fever while taking methimazole,  stop it and call us, even if the reason is obvious, because of the risk of a rare side-effect.   Please come back for a follow-up appointment in 3-4 months.    addendum: Please continue the same methimazole Ref pulm Check CT

## 2015-12-28 NOTE — Patient Instructions (Addendum)
blood tests and a chest x-ray are requested for you today.  We'll let you know about the results.   Here is a prescription for cough syrup.   i have sent a prescription to your pharmacy: for an inhaler and against heartburn (sometimes heartburn masquerades as a cough).  I hope you feel better soon.  If you don't feel better by next week, please call Dr [name].  Please call her sooner if you get worse.  if ever you have fever while taking methimazole, stop it and call us, even if the reason is obvious, because of the risk of a rare side-effect.   Please come back for a follow-up appointment in 3-4 months.

## 2015-12-30 ENCOUNTER — Encounter: Payer: Self-pay | Admitting: Internal Medicine

## 2015-12-30 ENCOUNTER — Ambulatory Visit (INDEPENDENT_AMBULATORY_CARE_PROVIDER_SITE_OTHER): Payer: PRIVATE HEALTH INSURANCE | Admitting: Internal Medicine

## 2015-12-30 VITALS — BP 116/80 | HR 81 | Ht 71.0 in | Wt 260.0 lb

## 2015-12-30 DIAGNOSIS — J45991 Cough variant asthma: Secondary | ICD-10-CM | POA: Diagnosis not present

## 2015-12-30 DIAGNOSIS — R918 Other nonspecific abnormal finding of lung field: Secondary | ICD-10-CM

## 2015-12-30 MED ORDER — BUDESONIDE-FORMOTEROL FUMARATE 80-4.5 MCG/ACT IN AERO: INHALATION_SPRAY | RESPIRATORY_TRACT | Status: AC

## 2015-12-30 NOTE — Assessment & Plan Note (Signed)
12/30/2015  After extensive coaching HFA effectiveness =    75% > try symbicort 80 2bid   Most likely this is due to endobronchial dz but since no better on advair which can actually cause coughing rec trial of symbicort hfa 80 2bid

## 2015-12-30 NOTE — Assessment & Plan Note (Signed)
Symptoms since early Feb 2017 - CT Chest 12/31/2015 >>  Unfortunately most likely dx is bronchogenic ca obstructing rul with post obst atx but since he's based in [place] also in ddx   Discussed in detail all the  indications, usual  risks and alternatives  relative to the benefits with patient(with wife Butch Penny)  who agrees to proceed with bronchoscopy with biopsy on 4/28 p CT 4/27 planned   Total time devoted to counseling  = 35/36mreview case with pt/ discussion of options/alternatives/ personally creating in presence of pt  then going over specific  Instructions directly with the pt including how to use all of the meds but in particular covering each new medication in detail (see avs)

## 2015-12-30 NOTE — Patient Instructions (Addendum)
Symbicort 80 Take 2 puffs first thing in am and then another 2 puffs about 12 hours later.     Come to outpatient registration at [facility] (behind the ER) at 59 am Friday 01/01/16  with nothing to eat or drink after midnight Thursday for Bronchoscopy

## 2015-12-30 NOTE — Progress Notes (Signed)
Subjective:    Patient ID: [name], male    DOB: 04/01/1956,    MRN: [id number]  HPI  52 yowm quit smoking 2002 with new cough early Feb 2017 and abn cxr 12/27/15 > referred to pulmonary clinic 12/30/2015 by Dr Loanne Drilling   12/30/2015 1st [facility] office visit/ Wert   Chief Complaint  Patient presents with  . PULMONARY CONSULT    Referred by Dr Loanne Drilling for abn cxr and bad cough. Cough present x 6 weeks - mucus at times, mostly dry.  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 No unusual exp hx or h/o childhood pna/ asthma or knowledge of premature birth.  Sleeping ok without nocturnal  or early am exacerbation  of respiratory  c/o's or need for noct saba. Also denies any obvious fluctuation of symptoms with weather or environmental changes or other aggravating or alleviating factors except as outlined above   Current Medications, Allergies, Complete Past Medical History, Past Surgical History, Family History, and Social History were reviewed in [employer] record.              Review of Systems  Constitutional: Negative for fever and unexpected weight change.  HENT: Positive for dental problem. Negative for congestion, ear pain, nosebleeds, postnasal drip, rhinorrhea, sinus pressure, sneezing, sore throat and trouble swallowing.   Eyes: Negative for redness and itching.  Respiratory: Positive for cough and shortness of breath ( with regular activity- walking short distances). Negative for chest tightness and wheezing.   Cardiovascular: Negative for palpitations and leg swelling.  Gastrointestinal: Negative for nausea and vomiting.    Genitourinary: Negative for dysuria.  Musculoskeletal: Negative for joint swelling.  Skin: Negative for rash.  Neurological: Negative for headaches.  Hematological: Does not bruise/bleed easily.  Psychiatric/Behavioral: Negative for dysphoric mood. The patient is not nervous/anxious.        Objective:   Physical Exam  amb wm nad   Wt Readings from Last 3 Encounters:  12/30/15 260 lb (117.935 kg)  12/28/15 262 lb (118.842 kg)  10/29/15 263 lb (119.296 kg)    Vital signs reviewed   HEENT: nl dentition, turbinates, and oropharynx. Nl external ear canals without cough reflex   NECK :  without JVD/Nodes/TM/ nl carotid upstrokes bilaterally   LUNGS: no acc muscle use,  Nl contour chest with minimal insp / exp rhonchi not localized   CV:  RRR  no s3 or murmur or increase in P2, no edema   ABD:  soft and nontender with nl inspiratory excursion in the supine position. No bruits or organomegaly, bowel sounds nl  MS:  Nl gait/ ext warm without deformities, calf tenderness, cyanosis or clubbing No obvious joint restrictions   SKIN: warm and dry without lesions    NEURO:  alert, approp, nl sensorium with  no motor deficits      I personally reviewed images and agree with radiology impression as follows:  CXR:  12/28/15   1. Right hilar masslike consolidation with adjacent consolidation of the right upper lobe, favor neoplasm over pneumonia given the mass effect on the mediastinal structures. Possible lower thoracic vertebral metastasis. Small right  effusion  Labs  reviewed:      Chemistry      Component Value Date/Time   NA 136 12/28/2015 1141   K 3.8 12/28/2015 1141   CL 101 12/28/2015 1141   CO2 28 12/28/2015 1141   BUN 15 12/28/2015 1141   CREATININE 0.93 12/28/2015 1141   CREATININE 0.61 05/29/2014 1659      Component Value Date/Time   CALCIUM 9.1 12/28/2015 1141   ALKPHOS 101 04/08/2015 1411   AST 34 04/08/2015 1411   ALT 17 04/08/2015 1411   BILITOT 0.7  04/08/2015 1411        Lab Results  Component Value Date   WBC 8.3 05/12/2015   HGB 14.8 05/12/2015   HCT 44.1 05/12/2015   MCV 99.6 05/12/2015   PLT 127.0* 05/12/2015        Lab Results  Component Value Date   TSH 2.39 12/28/2015           Assessment & Plan:

## 2015-12-31 ENCOUNTER — Ambulatory Visit
Admission: RE | Admit: 2015-12-31 | Discharge: 2015-12-31 | Disposition: A | Discharge status: Home / Self Care | Payer: PRIVATE HEALTH INSURANCE | Source: Ambulatory Visit | Attending: Endocrinology | Admitting: Endocrinology

## 2015-12-31 DIAGNOSIS — R918 Other nonspecific abnormal finding of lung field: Secondary | ICD-10-CM

## 2015-12-31 MED ORDER — IOPAMIDOL (ISOVUE-300) INJECTION 61%
75.0000 mL | Freq: Once | INTRAVENOUS | Status: AC | PRN
  Administered 2015-12-31: 75 mL via INTRAVENOUS

## 2016-01-01 ENCOUNTER — Encounter (HOSPITAL_COMMUNITY)
Admission: AD | Disposition: A | Discharge status: Home / Self Care | Payer: Self-pay | Source: Ambulatory Visit | Attending: Internal Medicine

## 2016-01-01 ENCOUNTER — Ambulatory Visit (HOSPITAL_COMMUNITY)
Admission: RE | Admit: 2016-01-01 | Discharge: 2016-01-01 | Disposition: A | Discharge status: Home / Self Care | Payer: PRIVATE HEALTH INSURANCE | Source: Ambulatory Visit | Attending: Internal Medicine | Admitting: Internal Medicine

## 2016-01-01 ENCOUNTER — Telehealth: Payer: Self-pay | Admitting: *Deleted

## 2016-01-01 ENCOUNTER — Encounter (HOSPITAL_COMMUNITY): Payer: Self-pay | Admitting: Respiratory Therapy

## 2016-01-01 ENCOUNTER — Other Ambulatory Visit: Payer: Self-pay | Admitting: Internal Medicine

## 2016-01-01 ENCOUNTER — Ambulatory Visit (HOSPITAL_COMMUNITY)
Admission: AD | Admit: 2016-01-01 | Discharge: 2016-01-01 | Disposition: A | Discharge status: Home / Self Care | Payer: PRIVATE HEALTH INSURANCE | Source: Ambulatory Visit | Attending: Internal Medicine | Admitting: Internal Medicine

## 2016-01-01 DIAGNOSIS — J984 Other disorders of lung: Secondary | ICD-10-CM | POA: Insufficient documentation

## 2016-01-01 DIAGNOSIS — R918 Other nonspecific abnormal finding of lung field: Secondary | ICD-10-CM

## 2016-01-01 DIAGNOSIS — J9811 Atelectasis: Secondary | ICD-10-CM | POA: Diagnosis present

## 2016-01-01 DIAGNOSIS — Z87891 Personal history of nicotine dependence: Secondary | ICD-10-CM | POA: Diagnosis not present

## 2016-01-01 HISTORY — PX: VIDEO BRONCHOSCOPY: SHX5072

## 2016-01-01 SURGERY — BRONCHOSCOPY, WITH FLUOROSCOPY
Anesthesia: Moderate Sedation | Laterality: Bilateral

## 2016-01-01 MED ORDER — LIDOCAINE HCL 2 % EX GEL
CUTANEOUS | Status: DC | PRN
  Administered 2016-01-01: 1

## 2016-01-01 MED ORDER — MIDAZOLAM HCL 10 MG/2ML IJ SOLN
INTRAMUSCULAR | Status: DC | PRN
  Administered 2016-01-01 (×2): 2.5 mg via INTRAVENOUS

## 2016-01-01 MED ORDER — MEPERIDINE HCL 25 MG/ML IJ SOLN
INTRAMUSCULAR | Status: DC | PRN
  Administered 2016-01-01 (×2): 50 mg via INTRAVENOUS

## 2016-01-01 MED ORDER — LIDOCAINE HCL 1 % IJ SOLN
INTRAMUSCULAR | Status: DC | PRN
  Administered 2016-01-01: 6 mL via RESPIRATORY_TRACT

## 2016-01-01 MED ORDER — MEPERIDINE HCL 100 MG/ML IJ SOLN
INTRAMUSCULAR | Status: AC
  Filled 2016-01-01: qty 2

## 2016-01-01 MED ORDER — MIDAZOLAM HCL 5 MG/ML IJ SOLN
INTRAMUSCULAR | Status: AC
  Filled 2016-01-01: qty 2

## 2016-01-01 MED ORDER — PHENYLEPHRINE HCL 0.25 % NA SOLN
NASAL | Status: DC | PRN
  Administered 2016-01-01: 2 via NASAL

## 2016-01-01 MED ORDER — ALPRAZOLAM 0.5 MG PO TABS: ORAL_TABLET | ORAL | Status: DC

## 2016-01-01 MED ORDER — SODIUM CHLORIDE 0.9 % IV SOLN
INTRAVENOUS | Status: DC
  Administered 2016-01-01: 08:00:00 via INTRAVENOUS

## 2016-01-01 NOTE — Discharge Instructions (Signed)
Flexible Bronchoscopy, Care After These instructions give you information on caring for yourself after your procedure. Your doctor may also give you more specific instructions. Call your doctor if you have any problems or questions after your procedure. HOME CARE  Do not eat or drink anything for 2 hours after your procedure. If you try to eat or drink before the medicine wears off, food or drink could go into your lungs. You could also burn yourself.  After 2 hours have passed and when you can cough and gag normally, you may eat soft food and drink liquids slowly.  The day after the test, you may eat your normal diet.  You may do your normal activities.  Keep all doctor visits. GET HELP RIGHT AWAY IF:  You get more and more short of breath.  You get light-headed.  You feel like you are going to pass out (faint).  You have chest pain.  You have new problems that worry you.  You cough up more than a little blood.  You cough up more blood than before. MAKE SURE YOU:  Understand these instructions.  Will watch your condition.  Will get help right away if you are not doing well or get worse.   This information is not intended to replace advice given to you by your health care provider. Make sure you discuss any questions you have with your health care provider.   Document Released: 06/19/2009 Document Revised: 08/27/2013 Document Reviewed: 04/26/2013 Elsevier Interactive Patient Education 2016 [employer].  Nothing to eat or drink until  10:30am   Today    01/01/2016.

## 2016-01-01 NOTE — Telephone Encounter (Signed)
Rx has been called in per MW. Pt is aware. Nothing further was needed.

## 2016-01-01 NOTE — H&P (Signed)
Subjective:    Patient ID: [name], male DOB: [birth year], MRN: [id number]  HPI  79 yowm quit smoking 2002 with new cough early Feb 2017 and abn cxr 12/27/15 > referred to pulmonary clinic 12/30/2015 by Dr Loanne Drilling   12/30/2015 1st [facility] office visit/ [name]  Chief Complaint  Patient presents with  . PULMONARY CONSULT    Referred by Dr Loanne Drilling for abn cxr and bad cough. Cough present x 6 weeks - mucus at times, mostly dry. 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 No unusual exp hx or h/o childhood pna/ asthma or knowledge of premature birth.  Sleeping ok without nocturnal or early am exacerbation of respiratory c/o's or need for noct saba. Also denies any obvious fluctuation of symptoms with weather or environmental changes or other aggravating or alleviating factors except as outlined above   Current Medications, Allergies, Complete Past Medical History, Past Surgical History, Family History, and Social History were reviewed in [employer] record.        Review of Systems  Constitutional: Negative for fever and unexpected weight change.  HENT: Positive for dental problem. Negative for congestion, ear pain, nosebleeds, postnasal drip, rhinorrhea, sinus pressure, sneezing, sore throat and trouble swallowing.  Eyes: Negative for redness and itching.  Respiratory: Positive for cough and shortness of breath ( with regular activity- walking short distances). Negative for chest tightness and wheezing.  Cardiovascular: Negative for palpitations and leg swelling.  Gastrointestinal: Negative for nausea and vomiting.   Genitourinary: Negative for dysuria.  Musculoskeletal: Negative for joint swelling.  Skin: Negative for rash.  Neurological: Negative for headaches.  Hematological: Does not bruise/bleed easily.  Psychiatric/Behavioral: Negative for dysphoric mood. The patient is not nervous/anxious.       Objective:   Physical Exam  amb wm nad   Wt Readings from Last 3 Encounters:  12/30/15 260 lb (117.935 kg)  12/28/15 262 lb (118.842 kg)  10/29/15 263 lb (119.296 kg)    Vital signs reviewed   HEENT: nl dentition, turbinates, and oropharynx. Nl external ear canals without cough reflex   NECK : without JVD/Nodes/TM/ nl carotid upstrokes bilaterally   LUNGS: no acc muscle use, Nl contour chest with minimal insp / exp rhonchi not localized   CV: RRR no s3 or murmur or increase in P2, no edema   ABD: soft and nontender with nl inspiratory excursion in the supine position. No bruits or organomegaly, bowel sounds nl  MS: Nl gait/ ext warm without deformities, calf tenderness, cyanosis or clubbing No obvious joint restrictions   SKIN: warm and dry without lesions   NEURO: alert, approp, nl sensorium with no motor deficits     I personally reviewed images and agree with radiology impression as follows:  CXR: 12/28/15  1. Right hilar masslike consolidation with adjacent consolidation of the right upper lobe, favor neoplasm over pneumonia given the mass effect on the mediastinal structures. Possible lower thoracic vertebral metastasis. Small right effusion  Labs reviewed:     Chemistry     Component Value Date/Time   NA 136 12/28/2015 1141   K 3.8 12/28/2015 1141   CL 101 12/28/2015 1141  CO2 28 12/28/2015 1141   BUN 15 12/28/2015 1141   CREATININE 0.93 12/28/2015 1141   CREATININE 0.61 05/29/2014 1659     Component Value Date/Time   CALCIUM 9.1 12/28/2015 1141   ALKPHOS 101 04/08/2015 1411    AST 34 04/08/2015 1411   ALT 17 04/08/2015 1411   BILITOT 0.7 04/08/2015 1411       Lab Results  Component Value Date   WBC 8.3 05/12/2015   HGB 14.8 05/12/2015   HCT 44.1 05/12/2015   MCV 99.6 05/12/2015   PLT 127.0* 05/12/2015     Lab Results  Component Value Date   TSH 2.39 12/28/2015         Assessment & Plan:            Revision History       Date/Time User Action    > 12/30/2015 10:09 AM Tanda Rockers, MD Sign     12/30/2015 9:06 AM Valente David Madelin Headings, CMA Sign at close encounter              Lung mass - Tanda Rockers, MD at 12/30/2015 10:07 AM     Status: Written Related Problem: Lung mass   Expand All Collapse All   Symptoms since early Feb 2017 - CT Chest 12/31/2015 >>  Unfortunately most likely dx is bronchogenic ca obstructing rul with post obst atx but since he's based in [place] also in ddx   Discussed in detail all the indications, usual risks and alternatives relative to the benefits with patient(with wife [name]) who agrees to proceed with bronchoscopy with biopsy on 4/28 p CT 4/27 planned   Total time devoted to counseling = 35/35mreview case with pt/ discussion of options/alternatives/ personally creating in presence of pt then going over specific Instructions directly with the pt including how to use all of the meds but in particular covering each new medication in detail (see avs)             Cough variant asthma - MTanda Rockers MD at 12/30/2015 10:08 AM     Status: Written Related Problem: Cough variant asthma   Expand All Collapse All   12/30/2015 After extensive coaching HFA effectiveness = 75% > try symbicort 80 2bid   Most likely this is due to endobronchial dz but since no better on advair which can actually cause coughing rec trial of symbicort hfa 80 2bid       . I personally reviewed images and agree with radiology impression  as follows:  CT Chest   12/31/15   11.9 cm mass in the medial right upper and middle lobes, compatible with primary bronchogenic neoplasm. Mass occludes the right mainstem bronchus, extends to the right perihilar region, and abuts/invades the mediastinum.  Suspected lymphangitic spread of tumor in the right middle and lower lobes, less likely postobstructive opacity. Associated satellite nodularity in the right middle and lower lobes measuring up to 8 mm, suspicious for pulmonary metastases.   01/01/2016 day of fob no change in hx / exam/ pt aware of CT results      MChristinia Gully MD Pulmonary and [phone] 5:30 PM or weekends, call [phone]

## 2016-01-01 NOTE — Telephone Encounter (Addendum)
-----   Message from Tanda Rockers, MD sent at 01/01/2016  3:11 PM EDT ----- Regarding: FW: Meds needs for claustrophobia Xanax 0.5 1-2 1 hour before scan as needed    ----- Message -----    From: Kara Mead    Sent: 01/01/2016   3:03 PM      To: Rosana Berger, CMA, Tanda Rockers, MD Subject: Meds needs for claustrophobia                  Dr. Melvyn Novas,  Pt is scheduled for his PET on 01/08/16 and states he will need something to take for claustrophobia.  Thanks, [employer]

## 2016-01-01 NOTE — Progress Notes (Signed)
Video Bronchoscopy Done  Intervention Bronchial washing Intervention Bronchial biopsy Intervention Bronchial needle aspiration  Procedure tolerated well

## 2016-01-02 ENCOUNTER — Encounter (HOSPITAL_COMMUNITY): Payer: Self-pay | Admitting: Internal Medicine

## 2016-01-04 ENCOUNTER — Telehealth: Payer: Self-pay | Admitting: *Deleted

## 2016-01-04 ENCOUNTER — Telehealth: Payer: Self-pay | Admitting: Endocrinology

## 2016-01-04 NOTE — Telephone Encounter (Signed)
done

## 2016-01-04 NOTE — Telephone Encounter (Deleted)
Patient calling for the results of lab work.

## 2016-01-04 NOTE — Telephone Encounter (Signed)
Patient is calling for the results of labs work

## 2016-01-04 NOTE — Telephone Encounter (Signed)
-----   Message from Kara Mead sent at 01/01/2016  3:03 PM EDT ----- Regarding: Meds needs for claustrophobia Dr. Melvyn Novas,  Pt is scheduled for his PET on 01/08/16 and states he will need something to take for claustrophobia.  Thanks, [employer]

## 2016-01-04 NOTE — Telephone Encounter (Signed)
Spoke with pt regarding below.  He verbalized understanding.  Rx was phoned in on Friday, and pt has picked up medication.  Dr. Melvyn Novas, pt requesting bx results, please advise.  Thank you.

## 2016-01-04 NOTE — Telephone Encounter (Signed)
Called pt, awaiting bx but the washings were suspicious for a tumor and the bx may or may not give [place] dx/ PET for 01/08/16 planned and may give [place] an alternative site for bx

## 2016-01-04 NOTE — Telephone Encounter (Signed)
Please advise if we can call in something for his claustrophobia He already has xanax on med list  Thanks

## 2016-01-04 NOTE — Telephone Encounter (Signed)
Pt requesting tsh lab results 12/28/15. Pt notified of results - normal . Pt requesting that results be released to mychart.

## 2016-01-04 NOTE — Telephone Encounter (Signed)
Yes, intention was to use the xanax up to 2 before the procedure

## 2016-01-08 ENCOUNTER — Ambulatory Visit (INDEPENDENT_AMBULATORY_CARE_PROVIDER_SITE_OTHER)
Admission: RE | Admit: 2016-01-08 | Discharge: 2016-01-08 | Disposition: A | Discharge status: Home / Self Care | Payer: PRIVATE HEALTH INSURANCE | Source: Ambulatory Visit | Attending: Internal Medicine | Admitting: Internal Medicine

## 2016-01-08 ENCOUNTER — Other Ambulatory Visit (HOSPITAL_COMMUNITY)
Admission: RE | Admit: 2016-01-08 | Payer: PRIVATE HEALTH INSURANCE | Source: Ambulatory Visit | Admitting: Internal Medicine

## 2016-01-08 ENCOUNTER — Encounter (HOSPITAL_COMMUNITY)
Admission: RE | Admit: 2016-01-08 | Discharge: 2016-01-08 | Disposition: A | Discharge status: Home / Self Care | Payer: PRIVATE HEALTH INSURANCE | Source: Ambulatory Visit | Attending: Internal Medicine | Admitting: Internal Medicine

## 2016-01-08 ENCOUNTER — Ambulatory Visit (INDEPENDENT_AMBULATORY_CARE_PROVIDER_SITE_OTHER): Payer: PRIVATE HEALTH INSURANCE | Admitting: Internal Medicine

## 2016-01-08 ENCOUNTER — Encounter: Payer: Self-pay | Admitting: Internal Medicine

## 2016-01-08 ENCOUNTER — Other Ambulatory Visit (HOSPITAL_COMMUNITY)
Admission: RE | Admit: 2016-01-08 | Discharge: 2016-01-08 | Disposition: A | Discharge status: Home / Self Care | Payer: PRIVATE HEALTH INSURANCE | Source: Ambulatory Visit | Attending: Internal Medicine | Admitting: Internal Medicine

## 2016-01-08 VITALS — BP 100/64 | HR 91 | Ht 71.0 in | Wt 260.8 lb

## 2016-01-08 DIAGNOSIS — R918 Other nonspecific abnormal finding of lung field: Secondary | ICD-10-CM

## 2016-01-08 DIAGNOSIS — J948 Other specified pleural conditions: Secondary | ICD-10-CM | POA: Diagnosis not present

## 2016-01-08 DIAGNOSIS — J90 Pleural effusion, not elsewhere classified: Secondary | ICD-10-CM

## 2016-01-08 LAB — GLUCOSE, CAPILLARY: Glucose-Capillary: 126 mg/dL — ABNORMAL HIGH (ref 65–99)

## 2016-01-08 MED ORDER — FLUDEOXYGLUCOSE F - 18 (FDG) INJECTION
12.8600 | Freq: Once | INTRAVENOUS | Status: AC | PRN
  Administered 2016-01-08: 12.86 via INTRAVENOUS

## 2016-01-08 NOTE — Progress Notes (Signed)
   Subjective:    Patient ID: [name], male    DOB: 28-Oct-1955,    MRN: [id number]  HPI  82 yowm quit smoking 2002 with new cough early Feb 2017 and abn cxr 12/27/15 > referred to pulmonary clinic 12/30/2015 by Dr Loanne Drilling   12/30/2015 1st [facility] office visit/ [name]   Chief Complaint  Patient presents with  . PULMONARY CONSULT    Referred by Dr Loanne Drilling for abn cxr and bad cough. Cough present x 6 weeks - mucus at times, mostly dry.  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   FOB 01/01/16 > extrinsic compression RUL only, bx suspicious for nsc  PET 01/08/2016 > massive R effusion with pleural surface Pos/ multiple LN's including contralateral neck and 3 hep mets/ bone mets   01/08/2016 acute extended ov/[name] re: sob ? Related to effusion on R  Chief Complaint  Patient presents with  . Acute Visit    Increased SOB and cough since last visit.   gradually worse sob since fob to point unable to lie down in bed  with new massive R pleural effusion and thickening of pleura which is Pos on PET as well as liver, nodes, bones c/w stage IV.  Discussed in detail all the  indications, usual  risks and alternatives  relative to the benefits with patient who agrees to proceed with urgent R thoracentesis today  In sitting position, R post chest prep/draped sterile fashion and a total of 1 liter removed = serous fluid/ immediate relief and able to lie flat                                Objective:   Physical Exam  W/c bound  wm chronically ill appearing    01/08/2016        260  12/30/15 260 lb (117.935 kg)  12/28/15 262 lb (118.842 kg)  10/29/15 263 lb (119.296 kg)    Vital signs reviewed   HEENT: nl dentition, turbinates, and oropharynx. Nl external  ear canals without cough reflex   NECK :  without JVD/Nodes/TM/ nl carotid upstrokes bilaterally   LUNGS: no acc muscle use,  Nl contour chest with  decreaesed bs and dullness on R   CV:  RRR  no s3 or murmur or increase in P2, no edema   ABD:  soft and nontender with nl inspiratory excursion in the supine position. No bruits or organomegaly, bowel sounds nl  MS:  Nl gait/ ext warm without deformities, calf tenderness, cyanosis or clubbing No obvious joint restrictions   SKIN: warm and dry without lesions    NEURO:  alert, approp, nl sensorium with  no motor deficits      CXR PA and Lateral:   01/08/2016 :    I personally reviewed images and agree with radiology impression as follows:    Improved aeration on the right, with persisting pleural parenchymal thickening and dense opacity at the right base compatible with combination of known tumor and pleural effusion. No pneumothorax.  Left lung relatively well aerated.      Assessment & Plan:

## 2016-01-08 NOTE — Progress Notes (Signed)
Quick Note:  Spoke with pt and notified of results per Dr. Wert. Pt verbalized understanding and denied any questions.  ______ 

## 2016-01-08 NOTE — Patient Instructions (Signed)
Please remember to go to the  x-ray department downstairs for your tests - we will call you with the results when they are available.  If breathing worse over weekend or at night and can't get comfortable you will need to go to ER for additional drainage

## 2016-01-09 ENCOUNTER — Other Ambulatory Visit: Payer: Self-pay

## 2016-01-09 ENCOUNTER — Emergency Department (HOSPITAL_COMMUNITY): Payer: No Typology Code available for payment source

## 2016-01-09 ENCOUNTER — Other Ambulatory Visit (HOSPITAL_COMMUNITY): Payer: Self-pay

## 2016-01-09 ENCOUNTER — Inpatient Hospital Stay (HOSPITAL_COMMUNITY)
Admission: EM | Admit: 2016-01-09 | Discharge: 2016-01-11 | DRG: 181 | Disposition: A | Discharge status: Home / Self Care | Payer: No Typology Code available for payment source | Attending: Internal Medicine | Admitting: Internal Medicine

## 2016-01-09 ENCOUNTER — Encounter (HOSPITAL_COMMUNITY): Payer: Self-pay | Admitting: Emergency Medicine

## 2016-01-09 DIAGNOSIS — R0902 Hypoxemia: Secondary | ICD-10-CM | POA: Diagnosis present

## 2016-01-09 DIAGNOSIS — E0590 Thyrotoxicosis, unspecified without thyrotoxic crisis or storm: Secondary | ICD-10-CM | POA: Diagnosis present

## 2016-01-09 DIAGNOSIS — Z66 Do not resuscitate: Secondary | ICD-10-CM | POA: Diagnosis present

## 2016-01-09 DIAGNOSIS — I340 Nonrheumatic mitral (valve) insufficiency: Secondary | ICD-10-CM | POA: Diagnosis present

## 2016-01-09 DIAGNOSIS — J910 Malignant pleural effusion: Secondary | ICD-10-CM | POA: Diagnosis present

## 2016-01-09 DIAGNOSIS — J948 Other specified pleural conditions: Secondary | ICD-10-CM | POA: Diagnosis not present

## 2016-01-09 DIAGNOSIS — I481 Persistent atrial fibrillation: Secondary | ICD-10-CM | POA: Diagnosis present

## 2016-01-09 DIAGNOSIS — C778 Secondary and unspecified malignant neoplasm of lymph nodes of multiple regions: Secondary | ICD-10-CM | POA: Diagnosis present

## 2016-01-09 DIAGNOSIS — I10 Essential (primary) hypertension: Secondary | ICD-10-CM | POA: Diagnosis not present

## 2016-01-09 DIAGNOSIS — C787 Secondary malignant neoplasm of liver and intrahepatic bile duct: Secondary | ICD-10-CM | POA: Diagnosis present

## 2016-01-09 DIAGNOSIS — Z800 Family history of malignant neoplasm of digestive organs: Secondary | ICD-10-CM

## 2016-01-09 DIAGNOSIS — I4819 Other persistent atrial fibrillation: Secondary | ICD-10-CM | POA: Diagnosis present

## 2016-01-09 DIAGNOSIS — C7951 Secondary malignant neoplasm of bone: Secondary | ICD-10-CM | POA: Diagnosis present

## 2016-01-09 DIAGNOSIS — J90 Pleural effusion, not elsewhere classified: Secondary | ICD-10-CM | POA: Diagnosis present

## 2016-01-09 DIAGNOSIS — C3491 Malignant neoplasm of unspecified part of right bronchus or lung: Principal | ICD-10-CM | POA: Diagnosis present

## 2016-01-09 DIAGNOSIS — Z833 Family history of diabetes mellitus: Secondary | ICD-10-CM | POA: Diagnosis not present

## 2016-01-09 DIAGNOSIS — J449 Chronic obstructive pulmonary disease, unspecified: Secondary | ICD-10-CM | POA: Diagnosis present

## 2016-01-09 DIAGNOSIS — Z87891 Personal history of nicotine dependence: Secondary | ICD-10-CM

## 2016-01-09 DIAGNOSIS — R918 Other nonspecific abnormal finding of lung field: Secondary | ICD-10-CM | POA: Diagnosis present

## 2016-01-09 DIAGNOSIS — Z9889 Other specified postprocedural states: Secondary | ICD-10-CM

## 2016-01-09 DIAGNOSIS — K769 Liver disease, unspecified: Secondary | ICD-10-CM | POA: Diagnosis not present

## 2016-01-09 DIAGNOSIS — Z7951 Long term (current) use of inhaled steroids: Secondary | ICD-10-CM | POA: Diagnosis not present

## 2016-01-09 DIAGNOSIS — I480 Paroxysmal atrial fibrillation: Secondary | ICD-10-CM | POA: Diagnosis present

## 2016-01-09 DIAGNOSIS — Z79899 Other long term (current) drug therapy: Secondary | ICD-10-CM | POA: Diagnosis not present

## 2016-01-09 DIAGNOSIS — R591 Generalized enlarged lymph nodes: Secondary | ICD-10-CM | POA: Diagnosis not present

## 2016-01-09 DIAGNOSIS — Z801 Family history of malignant neoplasm of trachea, bronchus and lung: Secondary | ICD-10-CM | POA: Diagnosis not present

## 2016-01-09 DIAGNOSIS — R0602 Shortness of breath: Secondary | ICD-10-CM | POA: Diagnosis present

## 2016-01-09 DIAGNOSIS — J9811 Atelectasis: Secondary | ICD-10-CM | POA: Diagnosis present

## 2016-01-09 LAB — CBC WITH DIFFERENTIAL/PLATELET
BAND NEUTROPHILS: 45 %
Basophils Absolute: 0 10*3/uL (ref 0.0–0.1)
Basophils Relative: 0 %
EOS ABS: 2.4 10*3/uL — AB (ref 0.0–0.7)
EOS PCT: 11 %
HEMATOCRIT: 44.9 % (ref 39.0–52.0)
HEMOGLOBIN: 14.9 g/dL (ref 13.0–17.0)
LYMPHS PCT: 4 %
Lymphs Abs: 0.9 10*3/uL (ref 0.7–4.0)
MCH: 29.3 pg (ref 26.0–34.0)
MCHC: 33.2 g/dL (ref 30.0–36.0)
MCV: 88.4 fL (ref 78.0–100.0)
MONOS PCT: 3 %
Monocytes Absolute: 0.7 10*3/uL (ref 0.1–1.0)
Neutro Abs: 18.1 10*3/uL — ABNORMAL HIGH (ref 1.7–7.7)
Neutrophils Relative %: 37 %
Platelets: 173 10*3/uL (ref 150–400)
RBC: 5.08 MIL/uL (ref 4.22–5.81)
RDW: 14 % (ref 11.5–15.5)
WBC MORPHOLOGY: INCREASED
WBC: 22.1 10*3/uL — AB (ref 4.0–10.5)

## 2016-01-09 LAB — COMPREHENSIVE METABOLIC PANEL
ALBUMIN: 3 g/dL — AB (ref 3.5–5.0)
ALK PHOS: 60 U/L (ref 38–126)
ALT: 18 U/L (ref 17–63)
AST: 16 U/L (ref 15–41)
Anion gap: 6 (ref 5–15)
BILIRUBIN TOTAL: 1.1 mg/dL (ref 0.3–1.2)
BUN: 15 mg/dL (ref 6–20)
CALCIUM: 8.3 mg/dL — AB (ref 8.9–10.3)
CO2: 30 mmol/L (ref 22–32)
CREATININE: 1.02 mg/dL (ref 0.61–1.24)
Chloride: 102 mmol/L (ref 101–111)
GFR calc Af Amer: >60 mL/min (ref >60)
GFR calc non Af Amer: >60 mL/min (ref >60)
GLUCOSE: 121 mg/dL — AB (ref 65–99)
Potassium: 4.1 mmol/L (ref 3.5–5.1)
SODIUM: 138 mmol/L (ref 135–145)
Total Protein: 6.9 g/dL (ref 6.5–8.1)

## 2016-01-09 MED ORDER — ONDANSETRON HCL 4 MG/2ML IJ SOLN: 4.0000 mg | Freq: Four times a day (QID) | INTRAMUSCULAR | Status: DC | PRN

## 2016-01-09 MED ORDER — ACETAMINOPHEN 325 MG PO TABS: 650.0000 mg | ORAL_TABLET | Freq: Four times a day (QID) | ORAL | Status: DC | PRN

## 2016-01-09 MED ORDER — METOPROLOL TARTRATE 25 MG PO TABS
25.0000 mg | ORAL_TABLET | Freq: Two times a day (BID) | ORAL | Status: DC
  Filled 2016-01-09 (×3): qty 1

## 2016-01-09 MED ORDER — CETYLPYRIDINIUM CHLORIDE 0.05 % MT LIQD
7.0000 mL | Freq: Two times a day (BID) | OROMUCOSAL | Status: DC
  Administered 2016-01-10: 7 mL via OROMUCOSAL

## 2016-01-09 MED ORDER — SODIUM CHLORIDE 0.9 % IV SOLN: INTRAVENOUS | Status: DC

## 2016-01-09 MED ORDER — PANTOPRAZOLE SODIUM 40 MG PO TBEC
40.0000 mg | DELAYED_RELEASE_TABLET | Freq: Every day | ORAL | Status: DC
  Filled 2016-01-09 (×2): qty 1

## 2016-01-09 MED ORDER — METHIMAZOLE 5 MG PO TABS
5.0000 mg | ORAL_TABLET | Freq: Every day | ORAL | Status: DC
  Filled 2016-01-09 (×2): qty 1

## 2016-01-09 MED ORDER — ONDANSETRON HCL 4 MG PO TABS: 4.0000 mg | ORAL_TABLET | Freq: Four times a day (QID) | ORAL | Status: DC | PRN

## 2016-01-09 MED ORDER — MOMETASONE FURO-FORMOTEROL FUM 100-5 MCG/ACT IN AERO
2.0000 | INHALATION_SPRAY | Freq: Two times a day (BID) | RESPIRATORY_TRACT | Status: DC
  Filled 2016-01-09: qty 8.8

## 2016-01-09 MED ORDER — PROMETHAZINE-CODEINE 6.25-10 MG/5ML PO SYRP: 5.0000 mL | ORAL_SOLUTION | ORAL | Status: DC | PRN

## 2016-01-09 MED ORDER — ACETAMINOPHEN 650 MG RE SUPP: 650.0000 mg | Freq: Four times a day (QID) | RECTAL | Status: DC | PRN

## 2016-01-09 NOTE — ED Notes (Signed)
Per pt, states he had fluid removed yesterday-wife states they might not have gotten all of it of-states breathing labored and was told to come here to have more fluid removed

## 2016-01-09 NOTE — H&P (Addendum)
History and Physical    [name] YDX:[id number] DOB: 03-04-1956 DOA: 01/09/2016  Referring MD/NP/PA: EDP PCP: [name], MD  Outpatient Specialists: Wende Mott Patient coming from: Home  Chief Complaint: dyspnea  HPI: [name] is a 60 y.o. male with past medical history of hyperthyroidism, former smoker, history of A. fib status post DC cardioversion, was referred to pulmonary clinic in the last week of April due to an abnormal chest x-ray, he was seen by Dr. Melvyn Novas, 4/26 then subsequently had a CT scan of his chest, which showed a large lung mass and right pleural effusion, after this he underwent a bronchoscopy and biopsy on 4/28, endobronchial biopsy showed atypical cells suspicious for non-small cell lung cancer. He had a PET scan yesterday, was significant for stage IV right lung cancer with widespread nodal, pleural, hepatic, soft tissue and bony metastasis. After this he was seen in the pulmonary office by Dr. Melvyn Novas yesterday and had a bedside thoracentesis due to symptomatic pleural effusion, had 1 liter of fluid drained. Subsequently went home, reportably the post thoracentesis chest x-ray showed residual large effusion and he was asked to come to the ER if he became symptomatic over the weekend. Patient started having increased dyspnea and hence came to the emergency room today, in the emergency room he was hypoxic in the 79-80% range which improved to 94% on 2 L. Chest x-ray showed improved aeration with dense opacity at the right base due to combination of tumor and pleural effusion, no pneumothorax. EDP discussed with interventional radiologist, who recommended thoracentesis in a.m.   Review of Systems: As per HPI otherwise 10 point review of systems negative.    Past Medical History  Diagnosis Date  . HTN (hypertension)     pt unaware of this  . Persistent atrial fibrillation ([place])     now SR after DCCV   . Obesity   . H/O hiatal hernia   . Moderate  mitral regurgitation   . Hyperthyroidism     treated with tapazole    Past Surgical History  Procedure Laterality Date  . Lower back surgery      herniated nucleus pulposus left L5-S1; semihemilaminectomy and diskectomy with microdissection with microscope  . Right cornea repair      secondary to injury from a bungee cord  . Tonsillectomy    . Cardioversion N/A 05/08/2013    unsuccessful for persistent afib  . Tee without cardioversion N/A 06/24/2014    Procedure: TRANSESOPHAGEAL ECHOCARDIOGRAM (TEE);  Surgeon: Josue Hector, MD;  Location: [facility];  Service: Cardiovascular;  Laterality: N/A;  . Cardioversion N/A 04/09/2015    Procedure: CARDIOVERSION;  Surgeon: Josue Hector, MD;  Location: [facility] ENDOSCOPY;  Service: Cardiovascular;  Laterality: N/A;  . Video bronchoscopy Bilateral 01/01/2016    Procedure: VIDEO BRONCHOSCOPY WITH FLUORO;  Surgeon: Tanda Rockers, MD;  Location: [facility] ENDOSCOPY;  Service: Cardiopulmonary;  Laterality: Bilateral;     reports that he quit smoking about 15 years ago. His smoking use included Cigarettes. He has a 40.5 pack-year smoking history. He quit smokeless tobacco use about 13 years ago. He reports that he does not drink alcohol or use illicit drugs.  No Known Allergies  Family History  Problem Relation Age of Onset  . Diabetes Brother   . Hyperlipidemia Mother   . Cancer Father 42    cancer unknown primary  . Thyroid disease Mother     Prior to Admission medications   Medication Sig Start Date  End Date Taking? Authorizing Provider  budesonide-formoterol (SYMBICORT) 80-4.5 MCG/ACT inhaler Take 2 puffs first thing in am and then another 2 puffs about 12 hours later. 12/30/15  Yes Tanda Rockers, MD  methimazole (TAPAZOLE) 5 MG tablet Take 1 tablet (5 mg total) by mouth daily. 09/06/15  Yes Renato Shin, MD  metoprolol (LOPRESSOR) 50 MG tablet Take 0.5 tablets (25 mg total) by mouth 2 (two) times daily. 04/09/15  Yes Josue Hector, MD  omeprazole  (PRILOSEC) 40 MG capsule Take 1 capsule (40 mg total) by mouth daily. 12/28/15  Yes Renato Shin, MD  promethazine-codeine (PHENERGAN WITH CODEINE) 6.25-10 MG/5ML syrup Take 5 mLs by mouth every 4 (four) hours as needed. 12/28/15  Yes Renato Shin, MD    Physical Exam: Filed Vitals:   01/09/16 1512 01/09/16 1548 01/09/16 1727  BP: 124/74  118/32  Pulse: 94  83  Temp: 98.4 F (36.9 C)    TempSrc: Oral    Resp: 20  27  SpO2: 92% 79% 94%      Constitutional: NAD, calm, comfortable, no distress Filed Vitals:   01/09/16 1512 01/09/16 1548 01/09/16 1727  BP: 124/74  118/32  Pulse: 94  83  Temp: 98.4 F (36.9 C)    TempSrc: Oral    Resp: 20  27  SpO2: 92% 79% 94%   Eyes: PERRL, lids and conjunctivae normal ENMT: Mucous membranes are moist. Posterior pharynx clear of any exudate or lesions.Normal dentition.  Neck: normal, supple, no masses, no thyromegaly Respiratory: decreased BS on R Cardiovascular: Regular rate and rhythm, no murmurs / rubs / gallops. No extremity edema. 2+ pedal pulses. No carotid bruits.  Abdomen: no tenderness, no masses palpated. No hepatosplenomegaly. Bowel sounds positive.  Musculoskeletal: no clubbing / cyanosis. No joint deformity upper and lower extremities. Good ROM, no contractures. Normal muscle tone.  Skin: no rashes, lesions, ulcers. No induration Neurologic: CN 2-12 grossly intact. Sensation intact, DTR normal. Strength 5/5 in all 4.  Psychiatric: Normal judgment and insight. Alert and oriented x 3. Normal mood.    Labs on Admission: I have personally reviewed following labs and imaging studies  CBC:  Recent Labs Lab 01/09/16 1628  WBC 22.1*  NEUTROABS 18.1*  HGB 14.9  HCT 44.9  MCV 88.4  PLT 759   Basic Metabolic Panel:  Recent Labs Lab 01/09/16 1628  NA 138  K 4.1  CL 102  CO2 30  GLUCOSE 121*  BUN 15  CREATININE 1.02  CALCIUM 8.3*   GFR: Estimated Creatinine Clearance: 103.3 mL/min (by C-G formula based on Cr of  1.02). Liver Function Tests:  Recent Labs Lab 01/09/16 1628  AST 16  ALT 18  ALKPHOS 60  BILITOT 1.1  PROT 6.9  ALBUMIN 3.0*   No results for input(s): LIPASE, AMYLASE in the last 168 hours. No results for input(s): AMMONIA in the last 168 hours. Coagulation Profile: No results for input(s): INR, PROTIME in the last 168 hours. Cardiac Enzymes: No results for input(s): CKTOTAL, CKMB, CKMBINDEX, TROPONINI in the last 168 hours. BNP (last 3 results) No results for input(s): PROBNP in the last 8760 hours. HbA1C: No results for input(s): HGBA1C in the last 72 hours. CBG:  Recent Labs Lab 01/08/16 0745  GLUCAP 126*   Lipid Profile: No results for input(s): CHOL, HDL, LDLCALC, TRIG, CHOLHDL, LDLDIRECT in the last 72 hours. Thyroid Function Tests: No results for input(s): TSH, T4TOTAL, FREET4, T3FREE, THYROIDAB in the last 72 hours. Anemia Panel: No results for input(s): VITAMINB12,  FOLATE, FERRITIN, TIBC, IRON, RETICCTPCT in the last 72 hours. Urine analysis:    Component Value Date/Time   COLORURINE YELLOW 03/26/2008 [place] 03/26/2008 1557   LABSPEC 1.028 03/26/2008 1557   PHURINE 5.0 03/26/2008 1557   GLUCOSEU NEGATIVE 03/26/2008 1557   HGBUR NEGATIVE 03/26/2008 1557   [place] 03/26/2008 1557   KETONESUR NEGATIVE 03/26/2008 1557   PROTEINUR NEGATIVE 03/26/2008 1557   UROBILINOGEN 1.0 03/26/2008 1557   NITRITE NEGATIVE 03/26/2008 1557   LEUKOCYTESUR  03/26/2008 1557    NEGATIVE MICROSCOPIC NOT DONE ON URINES WITH NEGATIVE PROTEIN, BLOOD, LEUKOCYTES, NITRITE, OR GLUCOSE <1000 mg/dL.   Sepsis Labs: '@LABRCNTIP'$ (procalcitonin:4,lacticidven:4) )No results found for this or any previous visit (from the past 240 hour(s)).   Radiological Exams on Admission: Dg Chest 2 View  01/09/2016  CLINICAL DATA:  60 year old male with a history of shortness of breath EXAM: CHEST - 2 VIEW COMPARISON:  01/08/2016 FINDINGS: Cardiomediastinal silhouette  likely unchanged with the right heart border obscured by overlying lung/pleural disease. Left lung remains relatively well aerated, with minimal interstitial opacities. No pneumothorax. Improved opacity on the right with improved aeration of the right upper lung. Pleural parenchymal thickening with dense opacity at the right base persists. No displaced fracture. No displaced fracture. IMPRESSION: Improved aeration on the right, with persisting pleural parenchymal thickening and dense opacity at the right base compatible with combination of known tumor and pleural effusion. No pneumothorax. Left lung relatively well aerated. Signed, Dulcy Fanny. Earleen Newport, DO [facility] Radiology Electronically Signed   By: Corrie Mckusick D.O.   On: 01/09/2016 18:01   Dg Chest 2 View  01/08/2016  CLINICAL DATA:  Follow-up of thoracentesis; patient coughing, history of hypertension and stage IV right lung malignancy with metastases EXAM: CHEST  2 VIEW COMPARISON:  PET-CT study of today's date and CT scan of the chest of December 31, 2015 FINDINGS: The right hemi thorax is largely opacified. A small amount of aerated lung persists in the perihilar region. There is mild shift of the mediastinum toward the right. The left lung is well-expanded without focal infiltrate. The heart is normal in size where visualized. The bony thorax exhibits no acute abnormality. IMPRESSION: Near-total opacification of the right hemithorax consistent with large pleural effusion as well as atelectasis and known right upper lobe mass. No pneumothorax is observed. The opacification has progressed markedly since 31 December 2015. The left lung is clear. Electronically Signed   By: David  Martinique M.D.   On: 01/08/2016 15:31   Nm Pet Image Initial (pi) Skull Base To Thigh  01/08/2016  CLINICAL DATA:  Initial treatment strategy for lung mass. EXAM: NUCLEAR MEDICINE PET SKULL BASE TO THIGH TECHNIQUE: 12.86 mCi F-18 FDG was  injected intravenously. Full-ring PET imaging was performed from the skull base to thigh after the radiotracer. CT data was obtained and used for attenuation correction and anatomic localization. FASTING BLOOD GLUCOSE:  Value: 126 mg/dl COMPARISON:  CT chest dated 12/31/2015 FINDINGS: NECK No hypermetabolic lymph nodes in the neck. Two dominant lesions in the left thyroid gland, mid representative max SUV 13.5, nonspecific. CHEST Right upper and middle lobe mass, max SUV 16.0, compatible with primary bronchogenic neoplasm. This was better evaluated on recent CT, when it measured approximately 11.9 x 8.0 x 11.4 cm. Associated near complete collapse of the right lung secondary to a large right pleural effusion, malignant, with multiple enhancing pleural-based nodules, representative max SUV 16.2. Left lung is grossly clear.  No pneumothorax. Widespread thoracic lymphadenopathy, max SUV 16.6 in the subcarinal region. 9 mm short axis left supraclavicular nodes are present with max SUV 9.1. The heart is normal in size.  Trace pericardial fluid. ABDOMEN/PELVIS Motion degraded images. Three hepatic metastases, including a 2.1 cm lesion in segment 7 with max SUV 20.1 and a 1.5 cm lesion in segment 4A with max SUV 14.7. No abnormal hypermetabolic activity within the pancreas, adrenal glands, or spleen. No hypermetabolic lymph nodes in the abdomen or pelvis. SKELETON Innumerable intramuscular and subcutaneous soft tissue metastases. For example, representative max SUV 17.2 along the posterior left shoulder and representative max SUV 15.2 in the left gluteal region. Multifocal osseous metastases in the visualized axial and appendicular skeleton. For example: --Left clivus/skull base, max SUV 18.0 --Lower cervical spine, max SUV 6.0 --Left acromion, max SUV 8.2 --Lower thoracic spine, max SUV 5.4 --Left proximal femur, max SUV 15.0 IMPRESSION: Stage IV right lung cancer, with widespread nodal, pleural, hepatic, soft tissue, and  osseous metastases, as above. Electronically Signed   By: Julian Hy M.D.   On: 01/08/2016 09:53    EKG: Independently reviewed. NSR, no acute ST-T wave changes  Assessment/Plan Principal Problem:   Recurrent malignant pleural effusion  -PET scan significant for involvement of the pleura, doesn't have a cytology to prove this -Status post thoracentesis yesterday with 1 L drained  -IR  consulted for thoracentesis and will likely benefit from Pleurx catheter as well tomorrow  -Will discuss with pulmonary in a.m.  -Patient is hemodynamically stable and not in any distress tonight   Stage IV non-small cell lung cancer -PET scan significant for extensive metastasis -Has been referred to Dr. Earlie Server    Essential hypertension -BP stable, continue metoprolol    Persistent atrial fibrillation ([place]) -Status post DC cardioversion in August, in normal sinus rhythm, continue metoprolol   Hyperthyroidism -Continue methimazole  DVT prophylaxis: SCDs pending procedures tomorrow  Code Status: DNR Family Communication: Wife and daughter at bedside  Disposition Plan: Home in 1-2 days depending on clinical progress  Consults called: IR per EDP Admission status: inpatient   [name] MD [facility] Pager [phone]  If 7PM-7AM, please contact night-coverage [email or web address] Password [facility]  01/09/2016, 6:17 PM

## 2016-01-09 NOTE — ED Notes (Signed)
MD at bedside. Dr. Zenia Resides at bedside.

## 2016-01-09 NOTE — ED Provider Notes (Signed)
CSN: [id number]     Arrival date & time 01/09/16  1501 History   First MD Initiated Contact with Patient 01/09/16 1543     Chief Complaint  Patient presents with  . Shortness of Breath     (Consider location/radiation/quality/duration/timing/severity/associated sxs/prior Treatment) HPI Comments: Patient here complaining of increased shortness of breath since yesterday. Patient had a thoracentesis done as pulmonologist office for malignant effusion and since that time has had increasing dyspnea. Objective cough without fever or chills. No anginal or CHF symptoms. Was told by his doctor that became more short of breath to come here. On arrival here, the patient's pulse ox is 79% on room air. Place on 2 L of oxygen. He does not use oxygen at home. Denies any recent blood loss.  Patient is a 60 y.o. male presenting with shortness of breath. The history is provided by the patient and a relative.  Shortness of Breath   Past Medical History  Diagnosis Date  . HTN (hypertension)     pt unaware of this  . Persistent atrial fibrillation ([place])     now SR after DCCV   . Obesity   . H/O hiatal hernia   . Moderate mitral regurgitation   . Hyperthyroidism     treated with tapazole   Past Surgical History  Procedure Laterality Date  . Lower back surgery      herniated nucleus pulposus left L5-S1; semihemilaminectomy and diskectomy with microdissection with microscope  . Right cornea repair      secondary to injury from a bungee cord  . Tonsillectomy    . Cardioversion N/A 05/08/2013    unsuccessful for persistent afib  . Tee without cardioversion N/A 06/24/2014    Procedure: TRANSESOPHAGEAL ECHOCARDIOGRAM (TEE);  Surgeon: Josue Hector, MD;  Location: [place];  Service: Cardiovascular;  Laterality: N/A;  . Cardioversion N/A 04/09/2015    Procedure: CARDIOVERSION;  Surgeon: Josue Hector, MD;  Location: [facility] ENDOSCOPY;  Service: Cardiovascular;  Laterality: N/A;  . Video bronchoscopy  Bilateral 01/01/2016    Procedure: VIDEO BRONCHOSCOPY WITH FLUORO;  Surgeon: Tanda Rockers, MD;  Location: [facility] ENDOSCOPY;  Service: Cardiopulmonary;  Laterality: Bilateral;   Family History  Problem Relation Age of Onset  . Diabetes Brother   . Hyperlipidemia Mother   . Cancer Father 46    cancer unknown primary  . Thyroid disease Mother    Social History  Substance Use Topics  . Smoking status: Former Smoker -- 1.50 packs/day for 27 years    Types: Cigarettes    Quit date: 09/05/2000  . Smokeless tobacco: Former Systems developer    Quit date: 05/08/2002  . Alcohol Use: No    Review of Systems  Respiratory: Positive for shortness of breath.   All other systems reviewed and are negative.     Allergies  Review of patient's allergies indicates no known allergies.  Home Medications   Prior to Admission medications   Medication Sig Start Date End Date Taking? Authorizing Provider  budesonide-formoterol (SYMBICORT) 80-4.5 MCG/ACT inhaler Take 2 puffs first thing in am and then another 2 puffs about 12 hours later. 12/30/15  Yes Tanda Rockers, MD  methimazole (TAPAZOLE) 5 MG tablet Take 1 tablet (5 mg total) by mouth daily. 09/06/15  Yes Renato Shin, MD  metoprolol (LOPRESSOR) 50 MG tablet Take 0.5 tablets (25 mg total) by mouth 2 (two) times daily. 04/09/15  Yes Josue Hector, MD  omeprazole (PRILOSEC) 40 MG capsule Take 1 capsule (40 mg total)  by mouth daily. 12/28/15  Yes Renato Shin, MD  promethazine-codeine (PHENERGAN WITH CODEINE) 6.25-10 MG/5ML syrup Take 5 mLs by mouth every 4 (four) hours as needed. 12/28/15  Yes Renato Shin, MD   BP 124/74 mmHg  Pulse 94  Temp(Src) 98.4 F (36.9 C) (Oral)  Resp 20  SpO2 79% Physical Exam  Constitutional: He is oriented to person, place, and time. He appears well-developed and well-nourished.  Non-toxic appearance. No distress.  HENT:  Head: Normocephalic and atraumatic.  Eyes: Conjunctivae, EOM and lids are normal. Pupils are equal, round, and  reactive to light.  Neck: Normal range of motion. Neck supple. No tracheal deviation present. No thyroid mass present.  Cardiovascular: Normal rate, regular rhythm and normal heart sounds.  Exam reveals no gallop.   No murmur heard. Pulmonary/Chest: Effort normal. No stridor. No respiratory distress. He has decreased breath sounds in the right middle field and the right lower field. He has no wheezes. He has no rhonchi. He has no rales.  Abdominal: Soft. Normal appearance and bowel sounds are normal. He exhibits no distension. There is no tenderness. There is no rebound and no CVA tenderness.  Musculoskeletal: Normal range of motion. He exhibits no edema or tenderness.  Neurological: He is alert and oriented to person, place, and time. He has normal strength. No cranial nerve deficit or sensory deficit. GCS eye subscore is 4. GCS verbal subscore is 5. GCS motor subscore is 6.  Skin: Skin is warm and dry. No abrasion and no rash noted.  Psychiatric: He has a normal mood and affect. His speech is normal and behavior is normal.  Nursing note and vitals reviewed.   ED Course  Procedures (including critical care time) Labs Review Labs Reviewed  CBC WITH DIFFERENTIAL/PLATELET  COMPREHENSIVE METABOLIC PANEL    Imaging Review Dg Chest 2 View  01/08/2016  CLINICAL DATA:  Follow-up of thoracentesis; patient coughing, history of hypertension and stage IV right lung malignancy with metastases EXAM: CHEST  2 VIEW COMPARISON:  PET-CT study of today's date and CT scan of the chest of December 31, 2015 FINDINGS: The right hemi thorax is largely opacified. A small amount of aerated lung persists in the perihilar region. There is mild shift of the mediastinum toward the right. The left lung is well-expanded without focal infiltrate. The heart is normal in size where visualized. The bony thorax exhibits no acute abnormality. IMPRESSION: Near-total opacification of the right hemithorax consistent with large pleural  effusion as well as atelectasis and known right upper lobe mass. No pneumothorax is observed. The opacification has progressed markedly since 31 December 2015. The left lung is clear. Electronically Signed   By: David  Martinique M.D.   On: 01/08/2016 15:31   Nm Pet Image Initial (pi) Skull Base To Thigh  01/08/2016  CLINICAL DATA:  Initial treatment strategy for lung mass. EXAM: NUCLEAR MEDICINE PET SKULL BASE TO THIGH TECHNIQUE: 12.86 mCi F-18 FDG was injected intravenously. Full-ring PET imaging was performed from the skull base to thigh after the radiotracer. CT data was obtained and used for attenuation correction and anatomic localization. FASTING BLOOD GLUCOSE:  Value: 126 mg/dl COMPARISON:  CT chest dated 12/31/2015 FINDINGS: NECK No hypermetabolic lymph nodes in the neck. Two dominant lesions in the left thyroid gland, mid representative max SUV 13.5, nonspecific. CHEST Right upper and middle lobe mass, max SUV 16.0, compatible with primary bronchogenic neoplasm. This was better evaluated on recent CT, when it measured approximately 11.9 x 8.0 x 11.4 cm. Associated  near complete collapse of the right lung secondary to a large right pleural effusion, malignant, with multiple enhancing pleural-based nodules, representative max SUV 16.2. Left lung is grossly clear.  No pneumothorax. Widespread thoracic lymphadenopathy, max SUV 16.6 in the subcarinal region. 9 mm short axis left supraclavicular nodes are present with max SUV 9.1. The heart is normal in size.  Trace pericardial fluid. ABDOMEN/PELVIS Motion degraded images. Three hepatic metastases, including a 2.1 cm lesion in segment 7 with max SUV 20.1 and a 1.5 cm lesion in segment 4A with max SUV 14.7. No abnormal hypermetabolic activity within the pancreas, adrenal glands, or spleen. No hypermetabolic lymph nodes in the abdomen or pelvis. SKELETON Innumerable intramuscular and subcutaneous soft tissue metastases. For example, representative max SUV 17.2 along  the posterior left shoulder and representative max SUV 15.2 in the left gluteal region. Multifocal osseous metastases in the visualized axial and appendicular skeleton. For example: --Left clivus/skull base, max SUV 18.0 --Lower cervical spine, max SUV 6.0 --Left acromion, max SUV 8.2 --Lower thoracic spine, max SUV 5.4 --Left proximal femur, max SUV 15.0 IMPRESSION: Stage IV right lung cancer, with widespread nodal, pleural, hepatic, soft tissue, and osseous metastases, as above. Electronically Signed   By: Julian Hy M.D.   On: 01/08/2016 09:53   I have personally reviewed and evaluated these images and lab results as part of my medical decision-making.   EKG Interpretation None      MDM   Final diagnoses:  None    Patient's x-ray still with fluid on the right hemothorax. Spoke with the physician on-call for interventional radiology and states that the patient's thoracentesis will be done tomorrow unless he became unstable. Patient will be admitted by the hospitalist    Lacretia Leigh, MD 01/09/16 1751

## 2016-01-10 ENCOUNTER — Inpatient Hospital Stay (HOSPITAL_COMMUNITY): Payer: No Typology Code available for payment source

## 2016-01-10 ENCOUNTER — Encounter: Payer: Self-pay | Admitting: Internal Medicine

## 2016-01-10 LAB — CBC
HEMATOCRIT: 44.1 % (ref 39.0–52.0)
HEMOGLOBIN: 14.4 g/dL (ref 13.0–17.0)
MCH: 28.8 pg (ref 26.0–34.0)
MCHC: 32.7 g/dL (ref 30.0–36.0)
MCV: 88.2 fL (ref 78.0–100.0)
Platelets: 160 10*3/uL (ref 150–400)
RBC: 5 MIL/uL (ref 4.22–5.81)
RDW: 14 % (ref 11.5–15.5)
WBC: 22.6 10*3/uL — ABNORMAL HIGH (ref 4.0–10.5)

## 2016-01-10 LAB — COMPREHENSIVE METABOLIC PANEL
ALK PHOS: 59 U/L (ref 38–126)
ALT: 17 U/L (ref 17–63)
AST: 17 U/L (ref 15–41)
Albumin: 2.7 g/dL — ABNORMAL LOW (ref 3.5–5.0)
Anion gap: 9 (ref 5–15)
BILIRUBIN TOTAL: 0.7 mg/dL (ref 0.3–1.2)
BUN: 17 mg/dL (ref 6–20)
CALCIUM: 8.1 mg/dL — AB (ref 8.9–10.3)
CO2: 26 mmol/L (ref 22–32)
CREATININE: 1.01 mg/dL (ref 0.61–1.24)
Chloride: 104 mmol/L (ref 101–111)
GFR calc non Af Amer: >60 mL/min (ref >60)
GLUCOSE: 120 mg/dL — AB (ref 65–99)
Potassium: 4 mmol/L (ref 3.5–5.1)
SODIUM: 139 mmol/L (ref 135–145)
TOTAL PROTEIN: 6.3 g/dL — AB (ref 6.5–8.1)

## 2016-01-10 LAB — PROTIME-INR
INR: 1.34 (ref 0.00–1.49)
PROTHROMBIN TIME: 16.2 s — AB (ref 11.6–15.2)

## 2016-01-10 LAB — BODY FLUID CELL COUNT WITH DIFFERENTIAL
Eos, Fluid: 35 %
LYMPHS FL: 56 %
Monocyte-Macrophage-Serous Fluid: 5 % — ABNORMAL LOW (ref 50–90)
Neutrophil Count, Fluid: 4 % (ref 0–25)
WBC FLUID: 1225 uL — AB (ref 0–1000)

## 2016-01-10 NOTE — Progress Notes (Signed)
Addendum: Thoracentesis and Pleurx requested per IR. D/w Dr.Yamagata(IR ) this am, they are unable to place a Pleurx catheter over the weekend, they can do a large volume thoracentesis today and then set him up to have a Pleurx later this week in IR.  [name], MD

## 2016-01-10 NOTE — Procedures (Addendum)
Ultrasound-guided diagnostic and therapeutic right  thoracentesis performed yielding  2.3 liters of turbid,amber  fluid. No immediate complications. Follow-up chest x-ray pending. Only the above amount of fluid was removed today secondary to persistent pt coughing. If effusion recurs can set up for pleurx cath later next week as IP or OP.

## 2016-01-10 NOTE — Assessment & Plan Note (Signed)
Pt improved p 1 liter removed in office and still very large effusion on R which is almost certainly malignant and should yield enough cells for accurate dx of what is most certainly a stage IV lung ca   Advised to go to er over w/e if can't breath comfortably at rest

## 2016-01-10 NOTE — Progress Notes (Signed)
PROGRESS NOTE    [name]  YBO:[id number] DOB: 1956/04/17 DOA: 01/09/2016 PCP: [name], MD Outpatient Specialists:Pulm Dr.Wert Brief Narrative: [name] is a 60 y.o. male with past medical history of hyperthyroidism, former smoker, history of A. fib status post DC cardioversion, was referred to pulmonary clinic in the last week of April due to an abnormal chest x-ray, he was seen by Dr. Melvyn Novas, 4/26 then subsequently had a CT scan of his chest, which showed a large lung mass and right pleural effusion, after this he underwent a bronchoscopy and biopsy on 4/28, endobronchial biopsy showed atypical cells suspicious for non-small cell lung cancer. Then he had a PET scan 5/5, was significant for stage IV right lung cancer with widespread nodal, pleural, hepatic, soft tissue and bony metastasis. After this he was seen in the pulmonary office by Dr. Melvyn Novas 5/5 and had a bedside thoracentesis due to symptomatic pleural effusion, had 1 liter of fluid drained. Subsequently went home, reportably the post thoracentesis chest x-ray showed residual large effusion and he was asked to come to the ER if he became symptomatic over the weekend. Patient started having increased dyspnea and hence came to the emergency room today, in the emergency room he was hypoxic in the 79-80% range which improved to 94% on 2 L. Chest x-ray showed improved aeration with dense opacity at the right base due to combination of tumor and pleural effusion, no pneumothorax  Assessment & Plan:  Recurrent malignant pleural effusion  -PET scan significant for involvement of the pleura, don't have a cytology to prove this -Status post thoracentesis in office 5/5 with 1 L drained  -IR consulted for thoracentesis and Pleurx catheter, i d/w Dr.Yamagata today, they will be unable to perform a Pleurx catheter today, can do thoracentesis and set him up as outpatient for Pleurx later in the week -Pulm consulted, d/w  Dr.Young -he is anxious to go home post procedure -WBC remains up, which i suspect is due to malignancy, remains afebrile  Stage IV non-small cell lung cancer -PET scan significant for extensive metastasis -Has been referred to Dr. Earlie Server,   Essential hypertension -BP stable, continue metoprolol   Persistent atrial fibrillation ([place]) -Status post DC cardioversion in August, in normal sinus rhythm, continue metoprolol  Hyperthyroidism -Continue methimazole  DVT prophylaxis: SCDs pending procedures today  Code Status: DNR Family Communication: Wife and daughter at bedside  Disposition Plan: later today if stable post [place]  Consultants:   Pulm   IR   Procedures:Pending Antimicrobials:None  Subjective: Some dyspnea with exertion, feels ok otherwise  Objective: Filed Vitals:   01/09/16 1727 01/09/16 1945 01/09/16 1959 01/10/16 0611  BP: 118/32 120/52 146/35 100/57  Pulse: 83 95 99 91  Temp:   97.5 F (36.4 C) 98 F (36.7 C)  TempSrc:   Oral Oral  Resp: '27 21 18 18  '$ Height:   '5\' 10"'$  (1.778 m)   Weight:   116.5 kg (256 lb 13.4 oz)   SpO2: 94% 93% 95% 98%    Intake/Output Summary (Last 24 hours) at 01/10/16 1116 Last data filed at 01/09/16 2356  Gross per 24 hour  Intake    120 ml  Output      0 ml  Net    120 ml   Filed Weights   01/09/16 1959  Weight: 116.5 kg (256 lb 13.4 oz)    Examination:  General exam: Appears calm and comfortable  Respiratory system: decreased BS on R Cardiovascular system: S1 &  S2 heard, RRR. No JVD, murmurs, rubs, gallops or clicks. No pedal edema. Gastrointestinal system: Abdomen is nondistended, soft and nontender. No organomegaly or masses felt. Normal bowel sounds heard. Central nervous system: Alert and oriented. No focal neurological deficits. Extremities: Symmetric 5 x 5 power. Skin: No rashes, lesions or ulcers Psychiatry: Judgement and insight appear normal. Mood & affect appropriate.     Data Reviewed: I  have personally reviewed following labs and imaging studies  CBC:  Recent Labs Lab 01/09/16 1628 01/10/16 0435  WBC 22.1* 22.6*  NEUTROABS 18.1*  --   HGB 14.9 14.4  HCT 44.9 44.1  MCV 88.4 88.2  PLT 173 767   Basic Metabolic Panel:  Recent Labs Lab 01/09/16 1628 01/10/16 0435  NA 138 139  K 4.1 4.0  CL 102 104  CO2 30 26  GLUCOSE 121* 120*  BUN 15 17  CREATININE 1.02 1.01  CALCIUM 8.3* 8.1*   GFR: Estimated Creatinine Clearance: 101.9 mL/min (by C-G formula based on Cr of 1.01). Liver Function Tests:  Recent Labs Lab 01/09/16 1628 01/10/16 0435  AST 16 17  ALT 18 17  ALKPHOS 60 59  BILITOT 1.1 0.7  PROT 6.9 6.3*  ALBUMIN 3.0* 2.7*   No results for input(s): LIPASE, AMYLASE in the last 168 hours. No results for input(s): AMMONIA in the last 168 hours. Coagulation Profile:  Recent Labs Lab 01/10/16 0435  INR 1.34   Cardiac Enzymes: No results for input(s): CKTOTAL, CKMB, CKMBINDEX, TROPONINI in the last 168 hours. BNP (last 3 results) No results for input(s): PROBNP in the last 8760 hours. HbA1C: No results for input(s): HGBA1C in the last 72 hours. CBG:  Recent Labs Lab 01/08/16 0745  GLUCAP 126*   Lipid Profile: No results for input(s): CHOL, HDL, LDLCALC, TRIG, CHOLHDL, LDLDIRECT in the last 72 hours. Thyroid Function Tests: No results for input(s): TSH, T4TOTAL, FREET4, T3FREE, THYROIDAB in the last 72 hours. Anemia Panel: No results for input(s): VITAMINB12, FOLATE, FERRITIN, TIBC, IRON, RETICCTPCT in the last 72 hours. Urine analysis:    Component Value Date/Time   COLORURINE YELLOW 03/26/2008 [place] 03/26/2008 1557   LABSPEC 1.028 03/26/2008 1557   PHURINE 5.0 03/26/2008 1557   GLUCOSEU NEGATIVE 03/26/2008 1557   HGBUR NEGATIVE 03/26/2008 1557   [place] 03/26/2008 1557   KETONESUR NEGATIVE 03/26/2008 1557   PROTEINUR NEGATIVE 03/26/2008 1557   UROBILINOGEN 1.0 03/26/2008 1557   NITRITE NEGATIVE  03/26/2008 1557   LEUKOCYTESUR  03/26/2008 1557    NEGATIVE MICROSCOPIC NOT DONE ON URINES WITH NEGATIVE PROTEIN, BLOOD, LEUKOCYTES, NITRITE, OR GLUCOSE <1000 mg/dL.   Sepsis Labs: '@LABRCNTIP'$ (procalcitonin:4,lacticidven:4)  )No results found for this or any previous visit (from the past 240 hour(s)).       Radiology Studies: Dg Chest 2 View  01/09/2016  CLINICAL DATA:  60 year old male with a history of shortness of breath EXAM: CHEST - 2 VIEW COMPARISON:  01/08/2016 FINDINGS: Cardiomediastinal silhouette likely unchanged with the right heart border obscured by overlying lung/pleural disease. Left lung remains relatively well aerated, with minimal interstitial opacities. No pneumothorax. Improved opacity on the right with improved aeration of the right upper lung. Pleural parenchymal thickening with dense opacity at the right base persists. No displaced fracture. No displaced fracture. IMPRESSION: Improved aeration on the right, with persisting pleural parenchymal thickening and dense opacity at the right base compatible with combination of known tumor and pleural effusion. No pneumothorax. Left lung relatively well aerated. Signed, Dulcy Fanny. Earleen Newport, DO  [facility] Electronically Signed   By: Corrie Mckusick D.O.   On: 01/09/2016 18:01   Dg Chest 2 View  01/08/2016  CLINICAL DATA:  Follow-up of thoracentesis; patient coughing, history of hypertension and stage IV right lung malignancy with metastases EXAM: CHEST  2 VIEW COMPARISON:  PET-CT study of today's date and CT scan of the chest of December 31, 2015 FINDINGS: The right hemi thorax is largely opacified. A small amount of aerated lung persists in the perihilar region. There is mild shift of the mediastinum toward the right. The left lung is well-expanded without focal infiltrate. The heart is normal in size where visualized. The bony thorax exhibits no acute abnormality. IMPRESSION: Near-total  opacification of the right hemithorax consistent with large pleural effusion as well as atelectasis and known right upper lobe mass. No pneumothorax is observed. The opacification has progressed markedly since 31 December 2015. The left lung is clear. Electronically Signed   By: David  [place] M.D.   On: 01/08/2016 15:31        Scheduled Meds: . antiseptic oral rinse  7 mL Mouth Rinse BID  . methimazole  5 mg Oral Daily  . metoprolol  25 mg Oral BID  . mometasone-formoterol  2 puff Inhalation BID  . pantoprazole  40 mg Oral Daily   Continuous Infusions:    LOS: 1 day    Time spent: 41mn    PDomenic Polite MD [facility] Pager [phone] If 7PM-7AM, please contact night-coverage [email or web address] Password [email or web address] 01/10/2016, 11:16 AM

## 2016-01-10 NOTE — Assessment & Plan Note (Signed)
Symptoms since early Feb 2017 - CT Chest 12/30/2015 >>  11.9 cm mass in the medial right upper and middle lobes, compatible with primary bronchogenic neoplasm. Mass occludes the right mainstem bronchus, extends to the right perihilar region, and abuts/invades the mediastinum.  Suspected lymphangitic spread of tumor in the right middle and lower lobes, less likely postobstructive opacity. Associated satellite nodularity in the right middle and lower lobes measuring up to 8 mm, suspicious for pulmonary metastases.  Associated left supraclavicular and mediastinal nodal metastases.  Moderate to large right pleural effusion with associated pleural-based nodularity, likely malignant.  Two 10 mm hypoenhancing lesions in the liver, indeterminate.  - FOB 01/01/16 > complete obst RUL orifice/ extrinsic/ bx/wang done >>> - PET 01/01/2016 >>> Stage IV right lung cancer, with widespread nodal, pleural, hepatic,soft tissue, and osseous   Await tissue dx from R thoracentesis 01/08/2016

## 2016-01-11 ENCOUNTER — Telehealth: Payer: Self-pay | Admitting: Internal Medicine

## 2016-01-11 ENCOUNTER — Other Ambulatory Visit: Payer: Self-pay | Admitting: Internal Medicine

## 2016-01-11 ENCOUNTER — Encounter: Payer: Self-pay | Admitting: Internal Medicine

## 2016-01-11 ENCOUNTER — Inpatient Hospital Stay (HOSPITAL_COMMUNITY): Payer: No Typology Code available for payment source

## 2016-01-11 DIAGNOSIS — C3411 Malignant neoplasm of upper lobe, right bronchus or lung: Secondary | ICD-10-CM | POA: Insufficient documentation

## 2016-01-11 DIAGNOSIS — R918 Other nonspecific abnormal finding of lung field: Secondary | ICD-10-CM

## 2016-01-11 DIAGNOSIS — J90 Pleural effusion, not elsewhere classified: Secondary | ICD-10-CM

## 2016-01-11 DIAGNOSIS — C3491 Malignant neoplasm of unspecified part of right bronchus or lung: Secondary | ICD-10-CM

## 2016-01-11 DIAGNOSIS — M799 Soft tissue disorder, unspecified: Secondary | ICD-10-CM

## 2016-01-11 DIAGNOSIS — R591 Generalized enlarged lymph nodes: Secondary | ICD-10-CM

## 2016-01-11 DIAGNOSIS — K769 Liver disease, unspecified: Secondary | ICD-10-CM

## 2016-01-11 DIAGNOSIS — M899 Disorder of bone, unspecified: Secondary | ICD-10-CM

## 2016-01-11 HISTORY — DX: Malignant neoplasm of unspecified part of right bronchus or lung: C34.91

## 2016-01-11 LAB — BASIC METABOLIC PANEL
Anion gap: 9 (ref 5–15)
BUN: 16 mg/dL (ref 6–20)
CALCIUM: 7.7 mg/dL — AB (ref 8.9–10.3)
CHLORIDE: 102 mmol/L (ref 101–111)
CO2: 26 mmol/L (ref 22–32)
CREATININE: 1.03 mg/dL (ref 0.61–1.24)
GFR calc Af Amer: >60 mL/min (ref >60)
GFR calc non Af Amer: >60 mL/min (ref >60)
GLUCOSE: 111 mg/dL — AB (ref 65–99)
Potassium: 4.6 mmol/L (ref 3.5–5.1)
Sodium: 137 mmol/L (ref 135–145)

## 2016-01-11 LAB — CBC
HEMATOCRIT: 45.5 % (ref 39.0–52.0)
HEMOGLOBIN: 14.6 g/dL (ref 13.0–17.0)
MCH: 28.9 pg (ref 26.0–34.0)
MCHC: 32.1 g/dL (ref 30.0–36.0)
MCV: 90.1 fL (ref 78.0–100.0)
Platelets: 139 10*3/uL — ABNORMAL LOW (ref 150–400)
RBC: 5.05 MIL/uL (ref 4.22–5.81)
RDW: 14.3 % (ref 11.5–15.5)
WBC: 23.9 10*3/uL — ABNORMAL HIGH (ref 4.0–10.5)

## 2016-01-11 NOTE — Progress Notes (Signed)
Patient 02 sats 91 on RA at rest.  Pt sats dropped to 85% on RA while ambulating.  Patient sats 96% on 2L while ambulating.  Patient became very SOB and dyspneic after a short walk one time down the hall.

## 2016-01-11 NOTE — Progress Notes (Signed)
[facility] notified of Home O2.

## 2016-01-11 NOTE — Progress Notes (Addendum)
Patient refusing all medications and states 'I will take my own medication' due to costs of medications through hospital.  Attempted to educated patient regarding safety precautions of taking medications only through nursing staff; patient and family refuse education.  Will continue to monitor.

## 2016-01-11 NOTE — Telephone Encounter (Signed)
Message was created in error.  Closing.

## 2016-01-11 NOTE — Progress Notes (Signed)
[place] [phone]   [phone]  INPATIENT CONSULT NOTE  REFERRING PHYSICIAN: Dr. [name]  REASON FOR CONSULTATION:  60 years old white male recently diagnosed with lung cancer.  HPI [name] is a 60 y.o. male with past medical history significant for hypertension, COPD, atrial fibrillation and hyperthyroidism. The patient has been complaining of cough for several weeks. He was seen at one of the urgent care centers and treated with a course of antibiotics with no significant improvement in his condition. He was seen for follow-up visit by his endocrine hours at Dr. Loanne Drilling. Chest x-ray was performed on 12/28/2015 and it showed right hilar masslike consolidation with adjacent consolidation of the right upper lobe. There are neoplasm of pneumonia given the mass effect on the mediastinal structure. There was also possible lower thoracic vertebral metastasis and a small right effusion. CT scan of the chest with contrast was performed on 12/31/2015 and it showed 11.9 cm mass in the medial right upper and middle lobes compatible with primary bronchogenic neoplasm. The mass occluded the right mainstem bronchus and extended into the right perihilar region and upper@6 /8 inflated the mediastinum. There was suspected lymphangitic spread of tumor in the right middle and lower lobes less likely postobstructive opacity. There was associated satellite nodularity in the right middle and lower lobes measuring up to 0.8 cm suspicious for pulmonary metastasis. There was associated left supraclavicular and mediastinal nodal metastasis. The scan also showed moderate to large right pleural effusion with associated pleural based nodularity likely malignant. There was also to 1.0 cm hypoenhancing lesion in the liver. The patient was referred to Dr. Melvyn Novas. On 01/01/2016 he underwent bronchoscopy under the care of Dr. Melvyn Novas.  The final pathology of the endobronchial  biopsy of the right upper lobe orifice (Accession: [id number]) as well as the bronchial lavage showed malignant cells suspicious for non-small cell carcinoma. His PET scan on 01/08/2016 showed a stage IV right lung cancer with widespread nodal, pleural, hepatic, soft tissue and osseous metastasis. The patient was admitted to [facility] on 01/09/2016 with worsening dyspnea and previous chest x-ray on 01/08/2015 showed near total opacification of the right hemithorax consistent with large pleural effusion as well as atelectasis and known right upper lobe mass. On 01/10/2016 he underwent ultrasound-guided diagnostic and therapeutic right thoracentesis with drainage of 2.3 L of turbid, amber fluid. The final cytology is still pending.  Dr. Broadus John kindly ask me to see the patient today for evaluation and recommendation regarding his recently diagnosed metastatic lung cancer. When seen today the patient is feeling fine except for the shortness of breath with exertion as well as left-sided chest pain. He has cough with no hemoptysis. He lost few pounds recently. He denied having any significant headache or visual changes, no nausea or vomiting. Family history significant for father with pancreatic cancer and several half-sisters with different types of cancer including lung cancer. The patient is married and has 2 daughters. He was accompanied by his wife and his 2 daughter at the bedside today. He used to work as a Administrator. He has a history of smoking up to 1.5 pack per day for around 30 years but quit 14 years ago. He drinks alcohol occasionally and no history of drug abuse.  HPI  Past Medical History  Diagnosis Date  . HTN (hypertension)     pt unaware of this  . Persistent atrial fibrillation ([place])     now SR after DCCV   .  Obesity   . H/O hiatal hernia   . Moderate mitral regurgitation   . Hyperthyroidism     treated with tapazole    Past Surgical History  Procedure Laterality  Date  . Lower back surgery      herniated nucleus pulposus left L5-S1; semihemilaminectomy and diskectomy with microdissection with microscope  . Right cornea repair      secondary to injury from a bungee cord  . Tonsillectomy    . Cardioversion N/A 05/08/2013    unsuccessful for persistent afib  . Tee without cardioversion N/A 06/24/2014    Procedure: TRANSESOPHAGEAL ECHOCARDIOGRAM (TEE);  Surgeon: Josue Hector, MD;  Location: [facility];  Service: Cardiovascular;  Laterality: N/A;  . Cardioversion N/A 04/09/2015    Procedure: CARDIOVERSION;  Surgeon: Josue Hector, MD;  Location: [facility] ENDOSCOPY;  Service: Cardiovascular;  Laterality: N/A;  . Video bronchoscopy Bilateral 01/01/2016    Procedure: VIDEO BRONCHOSCOPY WITH FLUORO;  Surgeon: Tanda Rockers, MD;  Location: [facility] ENDOSCOPY;  Service: Cardiopulmonary;  Laterality: Bilateral;    Family History  Problem Relation Age of Onset  . Diabetes Brother   . Hyperlipidemia Mother   . Cancer Father 51    cancer unknown primary  . Thyroid disease Mother     Social History Social History  Substance Use Topics  . Smoking status: Former Smoker -- 1.50 packs/day for 27 years    Types: Cigarettes    Quit date: 09/05/2000  . Smokeless tobacco: Former Systems developer    Quit date: 05/08/2002  . Alcohol Use: No    No Known Allergies  Current Facility-Administered Medications  Medication Dose Route Frequency Provider Last Rate Last Dose  . acetaminophen (TYLENOL) tablet 650 mg  650 mg Oral Q6H PRN [name], MD       Or  . acetaminophen (TYLENOL) suppository 650 mg  650 mg Rectal Q6H PRN [name], MD      . antiseptic oral rinse (CPC / CETYLPYRIDINIUM CHLORIDE 0.05%) solution 7 mL  7 mL Mouth Rinse BID [name], MD   7 mL at 01/10/16 1000  . methimazole (TAPAZOLE) tablet 5 mg  5 mg Oral Daily [name], MD   5 mg at 01/10/16 1000  . metoprolol tartrate (LOPRESSOR) tablet 25 mg  25 mg Oral BID [name], MD   25 mg at  01/09/16 2025  . mometasone-formoterol (DULERA) 100-5 MCG/ACT inhaler 2 puff  2 puff Inhalation BID [name], MD   2 puff at 01/09/16 2024  . ondansetron (ZOFRAN) tablet 4 mg  4 mg Oral Q6H PRN [name], MD       Or  . ondansetron [facility]) injection 4 mg  4 mg Intravenous Q6H PRN [name], MD      . pantoprazole (PROTONIX) EC tablet 40 mg  40 mg Oral Daily [name], MD   40 mg at 01/09/16 2024  . promethazine-codeine (PHENERGAN with CODEINE) 6.25-10 MG/5ML syrup 5 mL  5 mL Oral Q4H PRN [name], MD        Review of Systems  Constitutional: positive for fatigue and weight loss Eyes: negative Ears, nose, mouth, throat, and face: negative Respiratory: positive for cough and dyspnea on exertion Cardiovascular: negative Gastrointestinal: negative Genitourinary:negative Integument/breast: negative Hematologic/lymphatic: negative Musculoskeletal:negative Neurological: negative Behavioral/Psych: negative Endocrine: negative Allergic/Immunologic: negative  Physical Exam  LKG:MWNUU, healthy, no distress, well nourished, well developed and anxious SKIN: skin color, texture, turgor are normal, no rashes or significant lesions HEAD: Normocephalic, No masses, lesions, tenderness or  abnormalities EYES: normal, PERRLA, Conjunctiva are pink and non-injected EARS: External ears normal, Canals clear OROPHARYNX:no exudate, no erythema and lips, buccal mucosa, and tongue normal  NECK: supple, no adenopathy, no JVD LYMPH:  no palpable lymphadenopathy, no hepatosplenomegaly LUNGS: decreased breath sounds, scattered rales bilaterally HEART: regular rate & rhythm, no murmurs and no gallops ABDOMEN:abdomen soft, non-tender, normal bowel sounds and no masses or organomegaly BACK: Back symmetric, no curvature., No CVA tenderness EXTREMITIES:no joint deformities, effusion, or inflammation, no edema, no skin discoloration  NEURO: alert & oriented x 3 with fluent speech, no  focal motor/sensory deficits  PERFORMANCE STATUS: ECOG 1  LABORATORY DATA: Lab Results  Component Value Date   WBC 23.9* 01/11/2016   HGB 14.6 01/11/2016   HCT 45.5 01/11/2016   MCV 90.1 01/11/2016   PLT 139* 01/11/2016    @LASTCHEM @  RADIOGRAPHIC STUDIES: Dg Chest 1 View  01/10/2016  CLINICAL DATA:  Patient status post right thoracentesis. EXAM: CHEST 1 VIEW COMPARISON:  Chest radiograph 01/09/2016 FINDINGS: Monitoring leads overlie the patient. Stable cardiac and mediastinal contours with large right hilar mass. Interval decrease in size of small right pleural effusion. Grossly unchanged diffuse airspace opacities throughout the right hemi thorax. Minimal atelectasis left lung base. IMPRESSION: Interval decrease in size of now small right pleural effusion. Grossly unchanged diffuse bilateral airspace opacities throughout the right hemi thorax. Re- demonstrated large right pulmonary mass. Electronically Signed   By: Lovey Newcomer M.D.   On: 01/10/2016 14:07   Dg Chest 2 View  01/11/2016  CLINICAL DATA:  Evaluate pleural effusion EXAM: CHEST  2 VIEW COMPARISON:  01/10/2016 FINDINGS: Cardiomediastinal silhouette is stable. Large right upper lobe/ perihilar mass again noted. There is small right pleural effusion with right basilar atelectasis. Left lung is clear. No pulmonary edema. IMPRESSION: Large right upper lobe/ perihilar mass again noted. There is small right pleural effusion with right basilar atelectasis. Left lung is clear. Electronically Signed   By: Lahoma Crocker M.D.   On: 01/11/2016 09:25   Dg Chest 2 View  01/09/2016  CLINICAL DATA:  60 year old male with a history of shortness of breath EXAM: CHEST - 2 VIEW COMPARISON:  01/08/2016 FINDINGS: Cardiomediastinal silhouette likely unchanged with the right heart border obscured by overlying lung/pleural disease. Left lung remains relatively well aerated, with minimal interstitial opacities. No pneumothorax. Improved opacity on the right with  improved aeration of the right upper lung. Pleural parenchymal thickening with dense opacity at the right base persists. No displaced fracture. No displaced fracture. IMPRESSION: Improved aeration on the right, with persisting pleural parenchymal thickening and dense opacity at the right base compatible with combination of known tumor and pleural effusion. No pneumothorax. Left lung relatively well aerated. Signed, Dulcy Fanny. Earleen Newport, DO [facility] Electronically Signed   By: Corrie Mckusick D.O.   On: 01/09/2016 18:01   Dg Chest 2 View  01/08/2016  CLINICAL DATA:  Follow-up of thoracentesis; patient coughing, history of hypertension and stage IV right lung malignancy with metastases EXAM: CHEST  2 VIEW COMPARISON:  PET-CT study of today's date and CT scan of the chest of December 31, 2015 FINDINGS: The right hemi thorax is largely opacified. A small amount of aerated lung persists in the perihilar region. There is mild shift of the mediastinum toward the right. The left lung is well-expanded without focal infiltrate. The heart is normal in size where visualized. The bony thorax exhibits no acute abnormality. IMPRESSION: Near-total opacification of the right hemithorax  consistent with large pleural effusion as well as atelectasis and known right upper lobe mass. No pneumothorax is observed. The opacification has progressed markedly since 31 December 2015. The left lung is clear. Electronically Signed   By: David  Martinique M.D.   On: 01/08/2016 15:31   Dg Chest 2 View  12/28/2015  CLINICAL DATA:  Cough with SOB and right sided chest pain x 6 weeks, HTN, hx Cardioversion 04/2015, there are no other chest complaints EXAM: CHEST - 2 VIEW COMPARISON:  05/29/2014 FINDINGS: Masslike consolidation at the right hilum extending into the right upper lobe. There is mild mass effect upon the trachea and right main mainstem bronchus with mild narrowing. Left lung remains clear.  Heart size normal. Small right pleural effusion has developed. Sclerotic change in a low thoracic vertebral body was not conspicuous on the prior study and may represent metastatic disease or Paget's disease. IMPRESSION: 1. Right hilar masslike consolidation with adjacent consolidation of the right upper lobe, favor neoplasm over pneumonia given the mass effect on the mediastinal structures. Possible lower thoracic vertebral metastasis. Small right effusion. Recommend CT chest with contrast for further characterization and staging. These results will be called to the ordering clinician or representative by the Radiologist Assistant, and communication documented in the PACS or zVision Dashboard. Electronically Signed   By: Lucrezia Europe M.D.   On: 12/28/2015 12:31   Ct Chest W Contrast  12/31/2015  CLINICAL DATA:  Cough x6 weeks, abnormal chest radiograph EXAM: CT CHEST WITH CONTRAST TECHNIQUE: Multidetector CT imaging of the chest was performed during intravenous contrast administration. CONTRAST:  45m ISOVUE-300 IOPAMIDOL (ISOVUE-300) INJECTION 61% COMPARISON:  Chest radiographs dated 12/28/2015 FINDINGS: Mediastinum/Nodes: The heart is normal in size. No pericardial effusion. Coronary atherosclerosis in the LAD. Atherosclerotic calcifications of the aortic arch. Thoracic lymphadenopathy, including: --8 mm short axis left supraclavicular node (series 2/ image 5) --2.8 cm short axis high right paratracheal node (series 2/ image 46) --10 mm short axis AP window node (series 2/image 54) --3.5 cm short axis low right paratracheal node (series 2/ image 56) --1.8 cm short axis subcarinal node (series 2/ image 67) Lungs/Pleura: 11.9 x 8.0 x 11.4 cm mass in the medial right upper and middle lobes (series 2/image 47), compatible with primary bronchogenic neoplasm. Mass abuts the medial pleural surface, occludes the right mainstem bronchus (series 2/image 60), and directly extends into the right perihilar region and  mediastinum. Secondary ground-glass opacity with increased interstitial markings in the right middle lobe and right lower lobe (series 5/image 81), likely reflecting lymphangitic spread of tumor, less likely postobstructive opacity. Additional satellite nodularity in the right middle and lower lobes (for example, series 5/images 70, 83, 91, 92, and 105), measuring up to 8 mm, suspicious for metastases. Moderate to large right pleural effusion. Pleural-based nodularity (series 2/ image 121), suggesting malignant effusion. Associated compressive atelectasis in the right lower lobe. Left lung is notable for mild paraseptal emphysematous changes. No pneumothorax. Upper abdomen: Visualized upper abdomen is notable for a 10 mm hypoenhancing lesion in the right hepatic dome (series 2/image 115), indeterminate. Additional 10 mm hypoenhancing lesion in the central left hepatic lobe (series 2/ image 139). No evidence of adrenal metastases. Small upper abdominal lymph nodes measuring up to 9 mm short axis (series 2/ image 156), within normal limits. Musculoskeletal: Mild degenerative changes of the lower thoracic spine. No focal osseous lesions. IMPRESSION: 11.9 cm mass in the medial right upper and middle lobes, compatible with primary bronchogenic neoplasm. Mass occludes the  right mainstem bronchus, extends to the right perihilar region, and abuts/invades the mediastinum. Suspected lymphangitic spread of tumor in the right middle and lower lobes, less likely postobstructive opacity. Associated satellite nodularity in the right middle and lower lobes measuring up to 8 mm, suspicious for pulmonary metastases. Associated left supraclavicular and mediastinal nodal metastases. Moderate to large right pleural effusion with associated pleural-based nodularity, likely malignant. Two 10 mm hypoenhancing lesions in the liver, indeterminate. Electronically Signed   By: Julian Hy M.D.   On: 12/31/2015 15:27   Nm Pet Image  Initial (pi) Skull Base To Thigh  01/08/2016  CLINICAL DATA:  Initial treatment strategy for lung mass. EXAM: NUCLEAR MEDICINE PET SKULL BASE TO THIGH TECHNIQUE: 12.86 mCi F-18 FDG was injected intravenously. Full-ring PET imaging was performed from the skull base to thigh after the radiotracer. CT data was obtained and used for attenuation correction and anatomic localization. FASTING BLOOD GLUCOSE:  Value: 126 mg/dl COMPARISON:  CT chest dated 12/31/2015 FINDINGS: NECK No hypermetabolic lymph nodes in the neck. Two dominant lesions in the left thyroid gland, mid representative max SUV 13.5, nonspecific. CHEST Right upper and middle lobe mass, max SUV 16.0, compatible with primary bronchogenic neoplasm. This was better evaluated on recent CT, when it measured approximately 11.9 x 8.0 x 11.4 cm. Associated near complete collapse of the right lung secondary to a large right pleural effusion, malignant, with multiple enhancing pleural-based nodules, representative max SUV 16.2. Left lung is grossly clear.  No pneumothorax. Widespread thoracic lymphadenopathy, max SUV 16.6 in the subcarinal region. 9 mm short axis left supraclavicular nodes are present with max SUV 9.1. The heart is normal in size.  Trace pericardial fluid. ABDOMEN/PELVIS Motion degraded images. Three hepatic metastases, including a 2.1 cm lesion in segment 7 with max SUV 20.1 and a 1.5 cm lesion in segment 4A with max SUV 14.7. No abnormal hypermetabolic activity within the pancreas, adrenal glands, or spleen. No hypermetabolic lymph nodes in the abdomen or pelvis. SKELETON Innumerable intramuscular and subcutaneous soft tissue metastases. For example, representative max SUV 17.2 along the posterior left shoulder and representative max SUV 15.2 in the left gluteal region. Multifocal osseous metastases in the visualized axial and appendicular skeleton. For example: --Left clivus/skull base, max SUV 18.0 --Lower cervical spine, max SUV 6.0 --Left  acromion, max SUV 8.2 --Lower thoracic spine, max SUV 5.4 --Left proximal femur, max SUV 15.0 IMPRESSION: Stage IV right lung cancer, with widespread nodal, pleural, hepatic, soft tissue, and osseous metastases, as above. Electronically Signed   By: Julian Hy M.D.   On: 01/08/2016 09:53   US Thoracentesis Asp Pleural Space W/img Guide  01/10/2016  INDICATION: Patient with recent bronchoscopy revealing atypical cells, dyspnea, recurrent right pleural effusion. Request made for diagnostic and therapeutic right thoracentesis. EXAM: ULTRASOUND GUIDED DIAGNOSTIC AND THERAPEUTIC RIGHT THORACENTESIS MEDICATIONS: None. COMPLICATIONS: None immediate. PROCEDURE: An ultrasound guided thoracentesis was thoroughly discussed with the patient and questions answered. The benefits, risks, alternatives and complications were also discussed. The patient understands and wishes to proceed with the procedure. Written consent was obtained. Ultrasound was performed to localize and mark an adequate pocket of fluid in the right chest. The area was then prepped and draped in the normal sterile fashion. 1% Lidocaine was used for local anesthesia. Under ultrasound guidance a Safe-T-Centesis catheter was introduced. Thoracentesis was performed. The catheter was removed and a dressing applied. FINDINGS: A total of approximately 2.3 liters of turbid, amber fluid was removed. Samples were sent to the laboratory  as requested by the clinical team. Only the above amount of fluid was removed at this time secondary to persistent patient coughing . IMPRESSION: Successful ultrasound guided diagnostic and therapeutic right thoracentesis yielding 2.3 liters of pleural fluid. Read by: Rowe Robert, PA-C Electronically Signed   By: Aletta Edouard M.D.   On: 01/10/2016 12:57    ASSESSMENT: This is a very pleasant 60 years old white male with stage IV (T3, N3, M1b) non-small cell lung cancer questionable for adenocarcinoma, pending further  diagnostic studies diagnosed in April 2017 and presented with large right upper and middle lobe lung mass in addition to extensive mediastinal and supraclavicular lymphadenopathy as well as liver, bone and soft tissue lesions.   PLAN: I had a lengthy discussion with the patient and his family today about his current disease stage, prognosis and treatment options unfortunately this patient has very poor prognosis with the current extensive disease. I'm still awaiting the results of the cytology from the right pleural effusion to have more tissue for further testing including molecular study and PDL 1. If there is insufficient material, I may consider the patient for CT-guided core biopsy of one of the soft tissue or lung lesions to have enough tissue for molecular studies. I will also complete the staging workup by ordering a MRI of the brain which will be performed in the next few days on outpatient basis. I will arrange for the patient to have plasma test for EGFR mutation at the [place]. I will also arrange for the patient to have a follow-up appointment with me at the [place] in 1 week for reevaluation and more detailed discussion of his treatment options based on the final pathology and any available molecular studies. For the recurrent right pleural effusion, the patient may benefit from Pleurx catheter placement and this can be done outpatient basis by thoracic surgery or intervention radiology. I gave the patient and his family the time to ask questions and I answered them completely to their satisfaction. He is expected to be discharged from the hospital later today. The patient voices understanding of current disease status and treatment options and is in agreement with the current care plan.  All questions were answered. The patient knows to call the clinic with any problems, questions or concerns. We can certainly see the patient much sooner if necessary.  Thank you  so much for allowing me to participate in the care of [name]. I will continue to follow up the patient with you and assist in his care.  Disclaimer: This note was dictated with voice recognition software. Similar sounding words can inadvertently be transcribed and may not be corrected upon review.   Eustacio Ellen K. Jan 11, 2016, 5:26 PM

## 2016-01-11 NOTE — Discharge Summary (Addendum)
Physician Discharge Summary  [name] ZHG:[id number] DOB: 1956-07-31 DOA: 01/09/2016  PCP: Jason Koch, MD  Admit date: 01/09/2016 Discharge date: 01/11/2016  Time spent: 45 minutes  Recommendations for Outpatient Follow-up:  1. Dr.Wert 5/10 at 1:30pm, please FU Cytology, cell block was requested too from pleural fluid on 5/7, needs repeat CXR and definitive management for suspected Malignant effusion if re-accumulates soon 2. 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 male with past medical history of hyperthyroidism, former smoker, history of A. fib status post DC cardioversion, was referred to pulmonary clinic in the last week of April due to an abnormal chest x-ray, he was seen by Dr. Melvyn Novas, 4/26 then subsequently had a CT scan of his chest, which showed a large lung mass and right pleural effusion, after this he underwent a bronchoscopy and biopsy on 4/28, endobronchial biopsy showed atypical cells suspicious for non-small cell lung cancer. He had a PET scan 5/5, was significant for stage IV right lung cancer with widespread nodal, pleural, hepatic, soft tissue and bony metastasis. After this he was seen in the pulmonary office by Dr. Melvyn Novas on 5/5 and had a bedside thoracentesis due to symptomatic pleural effusion, had 1 liter of fluid drained. Patient started having increased dyspnea and hence came to the emergency room 5/6 evening, in the emergency room he was hypoxic in the 79-80% range which improved to 94% on 2 L. Chest x-ray showed improved  aeration with dense opacity at the right base due to combination of tumor and pleural effusion  Hospital Course:  Recurrent malignant pleural effusion  -PET scan significant for involvement of the pleura, don't have a cytology to prove this -Status post thoracentesis in office 5/5 with 1 L drained  -s/p Thoracentesis in IR 5/7 and 2.3L drained, repeat CXR yesterday and today with small residual effusion only. -IR was unable to place a Pleurx catheter yesterday, i discussed his pulmonologist Dr.Wert today regarding options, he felt Pt may need VATs for this instead and can be pursued if needed after close FU. -cytology and Cell block requested from Pleural fluid drained yesterday -i made him a FU with Dr.Wert for 5/10 , will need repeat CXR then  Stage IV non-small cell lung cancer -PET scan significant for extensive metastasis -Endobronchial biopsy on 4/28 showed atypical cells suspicious for non-small cell lung cancer. -Dr. Earlie Server consulted, family was very anxious to meet him prior to discharge, further workup and Rx per Dr.Mohamed and Dr.Wert  Leukocytosis: -due to malignancy most likley -CXR with opacities in R lung: suspect these opacities are due to mass and compressive atelectasis: clinically do not suspect pneumonia at this time, remains afebrile -stable, monitor   Essential hypertension -BP stable, continue metoprolol   Paroxysmal atrial fibrillation ([place]) -Status post DC cardioversion in August, in normal sinus rhythm, continue metoprolol  Hyperthyroidism -Continue methimazole  Procedures:  Thoracentesis : 5/7 with 2.3L removed  Consultations: IR dr.[place] Onc Dr.Mohamed Pulm: d/w Dr.Wert  Discharge Exam: Filed Vitals:   01/10/16 2236 01/11/16 1962  BP: 114/75 96/58  Pulse: 105 102  Temp: 98.9 F (37.2 C) 98.1 F (36.7 C)  Resp: 20 20    General: AAOx3 Cardiovascular: S1S2/RRR Respiratory: improved air movt on R, some ronchi  Discharge  Instructions   Discharge Instructions    Diet - low sodium heart healthy    Complete by:  As directed      Increase activity slowly    Complete by:  As directed           Current Discharge Medication List    CONTINUE these medications which have NOT CHANGED   Details  budesonide-formoterol (SYMBICORT) 80-4.5 MCG/ACT inhaler Take 2 puffs first thing in am and then another 2 puffs about 12 hours later.    methimazole (TAPAZOLE) 5 MG tablet Take 1 tablet (5 mg total) by mouth daily. Qty: 90 tablet, Refills: 0    metoprolol (LOPRESSOR) 50 MG tablet Take 0.5 tablets (25 mg total) by mouth 2 (two) times daily. Qty: 180 tablet, Refills: 2    omeprazole (PRILOSEC) 40 MG capsule Take 1 capsule (40 mg total) by mouth daily. Qty: 30 capsule, Refills: 3    promethazine-codeine (PHENERGAN WITH CODEINE) 6.25-10 MG/5ML syrup Take 5 mLs by mouth every 4 (four) hours as needed. Qty: 240 mL, Refills: 0       No Known Allergies Follow-up Information    Follow up with Jason Gully, MD On 01/13/2016.   Specialty:  Pulmonary Disease   Why:  at 1:30pm   Contact information:   [address] [place] [address] [phone]        The results of significant diagnostics from this hospitalization (including imaging, microbiology, ancillary and laboratory) are listed below for reference.    Significant Diagnostic Studies: Dg Chest 1 View  01/10/2016  CLINICAL DATA:  Patient status post right thoracentesis. EXAM: CHEST 1 VIEW COMPARISON:  Chest radiograph 01/09/2016 FINDINGS: Monitoring leads overlie the patient. Stable cardiac and mediastinal contours with large right hilar mass. Interval decrease in size of small right pleural effusion. Grossly unchanged diffuse airspace opacities throughout the right hemi thorax. Minimal atelectasis left lung base. IMPRESSION: Interval decrease in size of now small right pleural effusion. Grossly unchanged diffuse bilateral airspace opacities throughout the  right hemi thorax. Re- demonstrated large right pulmonary mass. Electronically Signed   By: Lovey Newcomer M.D.   On: 01/10/2016 14:07   Dg Chest 2 View  01/11/2016  CLINICAL DATA:  Evaluate pleural effusion EXAM: CHEST  2 VIEW COMPARISON:  01/10/2016 FINDINGS: Cardiomediastinal silhouette is stable. Large right upper lobe/ perihilar mass again noted. There is small right pleural effusion with right basilar atelectasis. Left lung is clear. No pulmonary edema. IMPRESSION: Large right upper lobe/ perihilar mass again noted. There is small right pleural effusion with right basilar atelectasis. Left lung is clear. Electronically Signed   By: Lahoma Crocker M.D.   On: 01/11/2016 09:25   Dg Chest 2 View  01/09/2016  CLINICAL DATA:  59 year old male with a history of shortness of breath EXAM: CHEST - 2 VIEW COMPARISON:  01/08/2016 FINDINGS: Cardiomediastinal silhouette likely unchanged with the right heart border obscured by overlying lung/pleural disease. Left lung remains relatively well aerated, with minimal interstitial opacities. No pneumothorax. Improved opacity on the right with improved aeration of the right upper lung. Pleural parenchymal thickening with dense opacity at the right base persists. No displaced fracture. No displaced fracture. IMPRESSION: Improved aeration on the right, with persisting pleural parenchymal thickening and dense opacity at the  right base compatible with combination of known tumor and pleural effusion. No pneumothorax. Left lung relatively well aerated. Signed, Dulcy Fanny. Earleen Newport, DO [facility] Electronically Signed   By: Corrie Mckusick D.O.   On: 01/09/2016 18:01   Dg Chest 2 View  01/08/2016  CLINICAL DATA:  Follow-up of thoracentesis; patient coughing, history of hypertension and stage IV right lung malignancy with metastases EXAM: CHEST  2 VIEW COMPARISON:  PET-CT study of today's date and CT scan of the chest of December 31, 2015  FINDINGS: The right hemi thorax is largely opacified. A small amount of aerated lung persists in the perihilar region. There is mild shift of the mediastinum toward the right. The left lung is well-expanded without focal infiltrate. The heart is normal in size where visualized. The bony thorax exhibits no acute abnormality. IMPRESSION: Near-total opacification of the right hemithorax consistent with large pleural effusion as well as atelectasis and known right upper lobe mass. No pneumothorax is observed. The opacification has progressed markedly since 31 December 2015. The left lung is clear. Electronically Signed   By: David  Martinique M.D.   On: 01/08/2016 15:31   Dg Chest 2 View  12/28/2015  CLINICAL DATA:  Cough with SOB and right sided chest pain x 6 weeks, HTN, hx Cardioversion 04/2015, there are no other chest complaints EXAM: CHEST - 2 VIEW COMPARISON:  05/29/2014 FINDINGS: Masslike consolidation at the right hilum extending into the right upper lobe. There is mild mass effect upon the trachea and right main mainstem bronchus with mild narrowing. Left lung remains clear. Heart size normal. Small right pleural effusion has developed. Sclerotic change in a low thoracic vertebral body was not conspicuous on the prior study and may represent metastatic disease or Paget's disease. IMPRESSION: 1. Right hilar masslike consolidation with adjacent consolidation of the right upper lobe, favor neoplasm over pneumonia given the mass effect on the mediastinal structures. Possible lower thoracic vertebral metastasis. Small right effusion. Recommend CT chest with contrast for further characterization and staging. These results will be called to the ordering clinician or representative by the Radiologist Assistant, and communication documented in the PACS or zVision Dashboard. Electronically Signed   By: Lucrezia Europe M.D.   On: 12/28/2015 12:31   Ct Chest W Contrast  12/31/2015  CLINICAL DATA:  Cough x6 weeks, abnormal chest  radiograph EXAM: CT CHEST WITH CONTRAST TECHNIQUE: Multidetector CT imaging of the chest was performed during intravenous contrast administration. CONTRAST:  65m ISOVUE-300 IOPAMIDOL (ISOVUE-300) INJECTION 61% COMPARISON:  Chest radiographs dated 12/28/2015 FINDINGS: Mediastinum/Nodes: The heart is normal in size. No pericardial effusion. Coronary atherosclerosis in the LAD. Atherosclerotic calcifications of the aortic arch. Thoracic lymphadenopathy, including: --8 mm short axis left supraclavicular node (series 2/ image 5) --2.8 cm short axis high right paratracheal node (series 2/ image 46) --10 mm short axis AP window node (series 2/image 54) --3.5 cm short axis low right paratracheal node (series 2/ image 56) --1.8 cm short axis subcarinal node (series 2/ image 67) Lungs/Pleura: 11.9 x 8.0 x 11.4 cm mass in the medial right upper and middle lobes (series 2/image 47), compatible with primary bronchogenic neoplasm. Mass abuts the medial pleural surface, occludes the right mainstem bronchus (series 2/image 60), and directly extends into the right perihilar region and mediastinum. Secondary ground-glass opacity with increased interstitial markings in the right middle lobe and right lower lobe (series 5/image 81), likely reflecting lymphangitic spread of tumor, less likely postobstructive opacity. Additional satellite nodularity  in the right middle and lower lobes (for example, series 5/images 70, 83, 91, 92, and 105), measuring up to 8 mm, suspicious for metastases. Moderate to large right pleural effusion. Pleural-based nodularity (series 2/ image 121), suggesting malignant effusion. Associated compressive atelectasis in the right lower lobe. Left lung is notable for mild paraseptal emphysematous changes. No pneumothorax. Upper abdomen: Visualized upper abdomen is notable for a 10 mm hypoenhancing lesion in the right hepatic dome (series 2/image 115), indeterminate. Additional 10 mm hypoenhancing lesion in the  central left hepatic lobe (series 2/ image 139). No evidence of adrenal metastases. Small upper abdominal lymph nodes measuring up to 9 mm short axis (series 2/ image 156), within normal limits. Musculoskeletal: Mild degenerative changes of the lower thoracic spine. No focal osseous lesions. IMPRESSION: 11.9 cm mass in the medial right upper and middle lobes, compatible with primary bronchogenic neoplasm. Mass occludes the right mainstem bronchus, extends to the right perihilar region, and abuts/invades the mediastinum. Suspected lymphangitic spread of tumor in the right middle and lower lobes, less likely postobstructive opacity. Associated satellite nodularity in the right middle and lower lobes measuring up to 8 mm, suspicious for pulmonary metastases. Associated left supraclavicular and mediastinal nodal metastases. Moderate to large right pleural effusion with associated pleural-based nodularity, likely malignant. Two 10 mm hypoenhancing lesions in the liver, indeterminate. Electronically Signed   By: Julian Hy M.D.   On: 12/31/2015 15:27   Nm Pet Image Initial (pi) Skull Base To Thigh  01/08/2016  CLINICAL DATA:  Initial treatment strategy for lung mass. EXAM: NUCLEAR MEDICINE PET SKULL BASE TO THIGH TECHNIQUE: 12.86 mCi F-18 FDG was injected intravenously. Full-ring PET imaging was performed from the skull base to thigh after the radiotracer. CT data was obtained and used for attenuation correction and anatomic localization. FASTING BLOOD GLUCOSE:  Value: 126 mg/dl COMPARISON:  CT chest dated 12/31/2015 FINDINGS: NECK No hypermetabolic lymph nodes in the neck. Two dominant lesions in the left thyroid gland, mid representative max SUV 13.5, nonspecific. CHEST Right upper and middle lobe mass, max SUV 16.0, compatible with primary bronchogenic neoplasm. This was better evaluated on recent CT, when it measured approximately 11.9 x 8.0 x 11.4 cm. Associated near complete collapse of the right lung  secondary to a large right pleural effusion, malignant, with multiple enhancing pleural-based nodules, representative max SUV 16.2. Left lung is grossly clear.  No pneumothorax. Widespread thoracic lymphadenopathy, max SUV 16.6 in the subcarinal region. 9 mm short axis left supraclavicular nodes are present with max SUV 9.1. The heart is normal in size.  Trace pericardial fluid. ABDOMEN/PELVIS Motion degraded images. Three hepatic metastases, including a 2.1 cm lesion in segment 7 with max SUV 20.1 and a 1.5 cm lesion in segment 4A with max SUV 14.7. No abnormal hypermetabolic activity within the pancreas, adrenal glands, or spleen. No hypermetabolic lymph nodes in the abdomen or pelvis. SKELETON Innumerable intramuscular and subcutaneous soft tissue metastases. For example, representative max SUV 17.2 along the posterior left shoulder and representative max SUV 15.2 in the left gluteal region. Multifocal osseous metastases in the visualized axial and appendicular skeleton. For example: --Left clivus/skull base, max SUV 18.0 --Lower cervical spine, max SUV 6.0 --Left acromion, max SUV 8.2 --Lower thoracic spine, max SUV 5.4 --Left proximal femur, max SUV 15.0 IMPRESSION: Stage IV right lung cancer, with widespread nodal, pleural, hepatic, soft tissue, and osseous metastases, as above. Electronically Signed   By: Julian Hy M.D.   On: 01/08/2016 09:53   US  Thoracentesis Asp Pleural Space W/img Guide  01/10/2016  INDICATION: Patient with recent bronchoscopy revealing atypical cells, dyspnea, recurrent right pleural effusion. Request made for diagnostic and therapeutic right thoracentesis. EXAM: ULTRASOUND GUIDED DIAGNOSTIC AND THERAPEUTIC RIGHT THORACENTESIS MEDICATIONS: None. COMPLICATIONS: None immediate. PROCEDURE: An ultrasound guided thoracentesis was thoroughly discussed with the patient and questions answered. The benefits, risks, alternatives and complications were also discussed. The patient  understands and wishes to proceed with the procedure. Written consent was obtained. Ultrasound was performed to localize and mark an adequate pocket of fluid in the right chest. The area was then prepped and draped in the normal sterile fashion. 1% Lidocaine was used for local anesthesia. Under ultrasound guidance a Safe-T-Centesis catheter was introduced. Thoracentesis was performed. The catheter was removed and a dressing applied. FINDINGS: A total of approximately 2.3 liters of turbid, amber fluid was removed. Samples were sent to the laboratory as requested by the clinical team. Only the above amount of fluid was removed at this time secondary to persistent patient coughing . IMPRESSION: Successful ultrasound guided diagnostic and therapeutic right thoracentesis yielding 2.3 liters of pleural fluid. Read by: Rowe Robert, PA-C Electronically Signed   By: Aletta Edouard M.D.   On: 01/10/2016 12:57    Microbiology: No results found for this or any previous visit (from the past 240 hour(s)).   Labs: Basic Metabolic Panel:  Recent Labs Lab 01/09/16 1628 01/10/16 0435 01/11/16 0456  NA 138 139 137  K 4.1 4.0 4.6  CL 102 104 102  CO2 '30 26 26  '$ GLUCOSE 121* 120* 111*  BUN '15 17 16  '$ CREATININE 1.02 1.01 1.03  CALCIUM 8.3* 8.1* 7.7*   Liver Function Tests:  Recent Labs Lab 01/09/16 1628 01/10/16 0435  AST 16 17  ALT 18 17  ALKPHOS 60 59  BILITOT 1.1 0.7  PROT 6.9 6.3*  ALBUMIN 3.0* 2.7*   No results for input(s): LIPASE, AMYLASE in the last 168 hours. No results for input(s): AMMONIA in the last 168 hours. CBC:  Recent Labs Lab 01/09/16 1628 01/10/16 0435 01/11/16 0456  WBC 22.1* 22.6* 23.9*  NEUTROABS 18.1*  --   --   HGB 14.9 14.4 14.6  HCT 44.9 44.1 45.5  MCV 88.4 88.2 90.1  PLT 173 160 139*   Cardiac Enzymes: No results for input(s): CKTOTAL, CKMB, CKMBINDEX, TROPONINI in the last 168 hours. BNP: BNP (last 3 results) No results for input(s): BNP in the last  8760 hours.  ProBNP (last 3 results) No results for input(s): PROBNP in the last 8760 hours.  CBG:  Recent Labs Lab 01/08/16 0745  GLUCAP 126*       SignedDomenic Polite MD.  [facility] 01/11/2016, 1:17 PM

## 2016-01-13 ENCOUNTER — Ambulatory Visit (INDEPENDENT_AMBULATORY_CARE_PROVIDER_SITE_OTHER)
Admission: RE | Admit: 2016-01-13 | Discharge: 2016-01-13 | Disposition: A | Discharge status: Home / Self Care | Payer: PRIVATE HEALTH INSURANCE | Source: Ambulatory Visit | Attending: Internal Medicine | Admitting: Internal Medicine

## 2016-01-13 ENCOUNTER — Encounter: Payer: Self-pay | Admitting: Internal Medicine

## 2016-01-13 ENCOUNTER — Ambulatory Visit (INDEPENDENT_AMBULATORY_CARE_PROVIDER_SITE_OTHER): Payer: PRIVATE HEALTH INSURANCE | Admitting: Internal Medicine

## 2016-01-13 VITALS — BP 94/62 | HR 107 | Wt 246.0 lb

## 2016-01-13 DIAGNOSIS — J45991 Cough variant asthma: Secondary | ICD-10-CM | POA: Diagnosis not present

## 2016-01-13 DIAGNOSIS — J948 Other specified pleural conditions: Secondary | ICD-10-CM

## 2016-01-13 DIAGNOSIS — J90 Pleural effusion, not elsewhere classified: Secondary | ICD-10-CM

## 2016-01-13 MED ORDER — ALPRAZOLAM 0.5 MG PO TABS: 0.5000 mg | ORAL_TABLET | Freq: Two times a day (BID) | ORAL | Status: DC | PRN

## 2016-01-13 MED ORDER — ACETAMINOPHEN-CODEINE #3 300-30 MG PO TABS: ORAL_TABLET | ORAL | Status: DC

## 2016-01-13 MED ORDER — OMEPRAZOLE 40 MG PO CPDR: 40.0000 mg | DELAYED_RELEASE_CAPSULE | Freq: Every day | ORAL | Status: AC

## 2016-01-13 NOTE — Progress Notes (Signed)
Subjective:    Patient ID: [name], male    DOB: [birth year],    MRN: [id number]  HPI  23 yowm quit smoking 2002 with new cough early Feb 2017 and abn cxr 12/27/15 > referred to pulmonary clinic 12/30/2015 by Dr Loanne Drilling   12/30/2015 1st [place] office visit/ Shonn Farruggia   Chief Complaint  Patient presents with  . PULMONARY CONSULT    Referred by Dr Loanne Drilling for abn cxr and bad cough. Cough present x 6 weeks - mucus at times, mostly dry.  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   FOB 01/01/16 > extrinsic compression RUL only, bx suspicious for nsc  PET 01/08/2016 > massive R effusion with pleural surface Pos/ multiple LN's including contralateral neck and 3 hep mets/ bone mets   01/08/2016 acute extended ov/Paul Torpey re: sob ? Related to effusion on R  Chief Complaint  Patient presents with  . Acute Visit    Increased SOB and cough since last visit.   gradually worse sob since fob to point unable to lie down in bed  with new massive R pleural effusion and thickening of pleura which is Pos on PET as well as liver, nodes, bones c/w stage IV.  Discussed in detail all the  indications, usual  risks and alternatives  relative to the benefits with patient who agrees to proceed with urgent R thoracentesis today  In sitting position, R post chest prep/draped sterile fashion and a total of 1 liter removed = serous fluid/ immediate relief and able to lie flat > [place]    01/10/16  X 2 liters > [place]   01/13/2016  Post hosp  f/u ov/Masen Luallen re: 2lpm 24/7 but 3 with activity  Chief Complaint  Patient presents with  . Follow-up    Breathing is unchanged. No new co's today.   doe across the room even on 3lpm but able to lie flat/ dry cough codeine dep symbicort not helping but  hfa poor - see a/p  No obvious day to day or daytime variability or assoc excess/ purulent sputum or mucus plugs or hemoptysis or cp or chest tightness, subjective wheeze or overt sinus or hb symptoms. No unusual exp hx or h/o childhood pna/ asthma or knowledge of premature birth.  Sleeping ok without nocturnal  or early am exacerbation  of respiratory  c/o's or need for noct saba. Also denies any obvious fluctuation of symptoms with weather or environmental changes or other aggravating or alleviating factors except as outlined above   Current Medications, Allergies, Complete Past Medical History, Past Surgical History, Family History, and Social History were reviewed in [employer] record.  ROS  The following are not active complaints unless bolded sore throat, dysphagia, dental problems, itching, sneezing,  nasal congestion or excess/ purulent secretions, ear ache,   fever, chills, sweats, unintended wt loss, classically pleuritic or exertional cp,  orthopnea pnd or leg swelling, presyncope, palpitations, abdominal pain, anorexia, nausea, vomiting, diarrhea  or change in bowel or bladder habits, change in stools or urine, dysuria,hematuria,  rash, arthralgias, visual complaints, headache, numbness, weakness or ataxia or problems with walking or coordination,  change in mood/affect or memory.  Objective:   Physical Exam  W/c bound  wm chronically ill appearing  01/13/2016       246   01/08/2016        260  12/30/15 260 lb (117.935 kg)  12/28/15 262 lb (118.842 kg)  10/29/15 263 lb (119.296 kg)    Vital signs reviewed   HEENT: nl dentition, turbinates, and oropharynx. Nl external ear canals without cough reflex   NECK :  without JVD/Nodes/TM/ nl carotid upstrokes bilaterally   LUNGS: no acc muscle use,  Nl contour chest with  decreaesed bs and dullness R base/ scattered bilateral insp and exp rhonchi   CV:  RRR  no s3 or murmur or increase in P2,  no edema   ABD:  soft and nontender with nl inspiratory excursion in the supine position. No bruits or organomegaly, bowel sounds nl  MS:  Nl gait/ ext warm without deformities, calf tenderness, cyanosis or clubbing No obvious joint restrictions   SKIN: warm and dry without lesions    NEURO:  alert, approp, nl sensorium with  no motor deficits      CXR PA and Lateral:   01/13/2016 :    I personally reviewed images and agree with radiology impression as follows:   Right pleural effusion is slightly larger(vs post tap 01/13/16). Right upper lobe mass is stable. No pneumothorax. Normal heart size.      Assessment & Plan:

## 2016-01-13 NOTE — Progress Notes (Signed)
Quick Note:  Spoke with pt and notified of results per Dr. Wert. Pt verbalized understanding and denied any questions.  ______ 

## 2016-01-13 NOTE — Patient Instructions (Addendum)
Please see patient coordinator before you leave today  to schedule T surgery Dr Roxan Hockey  Please remember to go to the   x-ray department downstairs for your tests - we will call you with the results when they are available.  Change to for cough or breathing difficulty or pain take Tylenol #3 one-two every 4 hours as needed   Work on inhaler technique:  relax and gently blow all the way out then take a nice smooth deep breath back in, triggering the inhaler at same time you start breathing in.  Hold for up to 5 seconds if you can. Blow out thru nose. Rinse and gargle with water when done

## 2016-01-14 ENCOUNTER — Encounter: Payer: Self-pay | Admitting: *Deleted

## 2016-01-14 ENCOUNTER — Encounter: Payer: Self-pay | Admitting: Internal Medicine

## 2016-01-14 ENCOUNTER — Ambulatory Visit (HOSPITAL_COMMUNITY)
Admission: RE | Admit: 2016-01-14 | Discharge: 2016-01-14 | Disposition: A | Discharge status: Home / Self Care | Payer: PRIVATE HEALTH INSURANCE | Source: Ambulatory Visit | Attending: Internal Medicine | Admitting: Internal Medicine

## 2016-01-14 ENCOUNTER — Other Ambulatory Visit: Payer: Self-pay | Admitting: Internal Medicine

## 2016-01-14 ENCOUNTER — Telehealth: Payer: Self-pay | Admitting: Internal Medicine

## 2016-01-14 DIAGNOSIS — C3491 Malignant neoplasm of unspecified part of right bronchus or lung: Secondary | ICD-10-CM

## 2016-01-14 DIAGNOSIS — C7989 Secondary malignant neoplasm of other specified sites: Secondary | ICD-10-CM | POA: Insufficient documentation

## 2016-01-14 DIAGNOSIS — C7951 Secondary malignant neoplasm of bone: Secondary | ICD-10-CM | POA: Diagnosis not present

## 2016-01-14 DIAGNOSIS — C7931 Secondary malignant neoplasm of brain: Secondary | ICD-10-CM | POA: Insufficient documentation

## 2016-01-14 DIAGNOSIS — C719 Malignant neoplasm of brain, unspecified: Secondary | ICD-10-CM

## 2016-01-14 DIAGNOSIS — J948 Other specified pleural conditions: Secondary | ICD-10-CM | POA: Insufficient documentation

## 2016-01-14 DIAGNOSIS — J90 Pleural effusion, not elsewhere classified: Secondary | ICD-10-CM

## 2016-01-14 HISTORY — DX: Malignant neoplasm of brain, unspecified: C71.9

## 2016-01-14 MED ORDER — GADOBENATE DIMEGLUMINE 529 MG/ML IV SOLN: 20.0000 mL | Freq: Once | INTRAVENOUS | Status: DC | PRN

## 2016-01-14 MED ORDER — DEXAMETHASONE 4 MG PO TABS: 4.0000 mg | ORAL_TABLET | Freq: Three times a day (TID) | ORAL | Status: DC

## 2016-01-14 NOTE — Telephone Encounter (Signed)
Called Caren Griffins in the lab Nothing needed in this message Will sign off

## 2016-01-14 NOTE — Telephone Encounter (Signed)
s.w. pt wife and and confirmed appts...ok and aware

## 2016-01-14 NOTE — Progress Notes (Signed)
Oncology Nurse Navigator Documentation  Oncology Nurse Navigator Flowsheets 01/14/2016  Navigator Encounter Type Other  Treatment Phase Pre-Tx/Tx Discussion  Barriers/Navigation Needs Coordination of Care  Interventions Coordination of Care  Acuity Level 2  Time Spent with Patient 26   Dr. Julien Nordmann gave me a verbal order for urgent referral to Rad Onc due to multiple brain mets.  I completed referral request in EPIC.  I also notified HIM team and Javata in [place] to schedule.

## 2016-01-14 NOTE — Assessment & Plan Note (Signed)
12/30/2015    try symbicort 80 2bid  - 01/13/2016  After extensive coaching HFA effectiveness =    75% from a baseline of < 25% so rec continue symbicort 80 2bid   Ok to control refractory cough with codeine > reviewed (see avs)

## 2016-01-14 NOTE — Telephone Encounter (Signed)
Jess, did you call him?

## 2016-01-14 NOTE — Op Note (Signed)
[facility] Patient Name: [name] Procedure Date: 01/01/2016 MRN: [id number] Attending MD: Tanda Rockers , MD Date of Birth: October 30, 1955 CSN: [id number] Age: 60 Admit Type: Outpatient Ethnicity: Not Hispanic or Latino Procedure:            Bronchoscopy Indications:          Atelectasis of the right upper lobe Providers:            Legrand Como B. Melvyn Novas, MD, Andre Lefort RRT,RCP, Ashley Mariner RRT,RCP Referring MD:          Medicines:            Midazolam 5 mg IV, demerol 50 mg IV Complications:        No immediate complications Estimated Blood Loss: Estimated blood loss was minimal. Procedure:      Pre-Anesthesia Assessment:      - A History and Physical has been performed. Patient meds and allergies       have been reviewed. The risks and benefits of the procedure and the       sedation options and risks were discussed with the patient. All       questions were answered and informed consent was obtained. Patient       identification and proposed procedure were verified prior to the       procedure by the physician and the technician in the procedure room.       Mental Status Examination: alert and oriented. Airway Examination:       normal oropharyngeal airway. Respiratory Examination: clear to       auscultation. CV Examination: RRR, no murmurs, no S3 or S4. ASA Grade       Assessment: II - A patient with mild systemic disease. After reviewing       the risks and benefits, the patient was deemed in satisfactory condition       to undergo the procedure. The anesthesia plan was to use moderate       sedation / analgesia (conscious sedation). Immediately prior to       administration of medications, the patient was re-assessed for adequacy       to receive sedatives. The heart rate, respiratory rate, oxygen       saturations, blood pressure, adequacy of pulmonary ventilation, and       response to care were monitored  throughout the procedure. The physical       status of the patient was re-assessed after the procedure.      After obtaining informed consent, the bronchoscope was passed under       direct vision. Throughout the procedure, the patient's blood pressure,       pulse, and oxygen saturations were monitored continuously. the [id number]       [id number] scope was introduced through the right nostril and advanced to       the tracheobronchial tree. After obtaining informed consent, the       bronchoscope was passed under direct vision. Throughout the procedure,       the patient's blood pressure, pulse, and oxygen saturations were       monitored continuously. Findings:      Right Lung Abnormalities: Extrinsic compression was found in the right       upper lobe. The airway lumen is occluded. The lesion was not traversed.  BAL was performed in the lung and sent for. 100 mL of fluid were       instilled. 60 mL were returned. The return was bloody. There were no       mucoid plugs in the return fluid. Transbronchial needle aspiration of a       mass was performed in the right upper lobe using a Wang needle and sent       for routine cytology. One sample was obtained. A nearly obstructing       (greater than 90% obstructed) mass was found proximally, at the orifice       in the right upper lobe. The mass was circumferential. The lesion was       not traversed. Impression:      - Extrinsic compression was found in the right upper lobe.      - Bronchoalveolar lavage was performed.      - A transbronchial needle aspiration was performed.      - A circumferential mass was found in the right upper lobe. This lesion       is likely malignant.      - Extrinsic compression was found in the right upper lobe secondary to a       mass. Moderate Sedation:      Moderate (conscious) sedation was personally administered by the       endoscopist. The following parameters were monitored: oxygen saturation,        heart rate, blood pressure, and response to care. Total physician       intraservice time was 15 minutes. Recommendation:      - Await BAL, biopsy, cytology and wang results.      - Discharge patient to home (ambulatory). Procedure Code(s):      --- Professional ---      [phone], Bronchoscopy, rigid or flexible, including fluoroscopic guidance,       when performed; with transbronchial needle aspiration biopsy(s),       trachea, main stem and/or lobar bronchus(i)      99152, Moderate sedation services provided by the same physician or       other qualified health care professional performing the diagnostic or       therapeutic service that the sedation supports, requiring the presence       of an independent trained observer to assist in the monitoring of the       patient's level of consciousness and physiological status; initial 15       minutes of intraservice time, patient age 45 years or older Diagnosis Code(s):      --- Professional ---      J98.4, Other disorders of lung      J98.9, Respiratory disorder, unspecified      J98.11, Atelectasis CPT copyright 2016 [employer]. All rights reserved. The codes documented in this report are preliminary and upon coder review may  be revised to meet current compliance requirements. Christinia Gully, MD Tanda Rockers, MD 01/14/2016 1:33:41 PM This report has been signed electronically. Number of Addenda: 0 Scope In: 8:18:00 AM Scope Out: 8:33:15 AM

## 2016-01-14 NOTE — Assessment & Plan Note (Signed)
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 and likely to gradually increase again over the next week with options discussed with pt  1) re tap prn while awaiting whether or not he'll respond to chemo - since this is adeno this seems unlikely  2) same can be said for pleurex, which some chance of scarring /adhesion  3) best chance for permanent palliation is VATS/ pleurodesis > would like to get T surg opinion asap on this to prevent further admits just to control the effusion   I had an extended discussion with the patient/wife/daughter reviewing all relevant studies completed to date and  lasting 15 to 20 minutes of a 25 minute visit    Discussed in detail all the  indications, usual  risks and alternatives  relative to the benefits with patient who agrees to proceed with rx as above     Each maintenance medication was reviewed in detail including most importantly the difference between maintenance and prns and under what circumstances the prns are to be triggered using an action plan format that is not reflected in the computer generated alphabetically organized AVS.    Please see instructions for details which were reviewed in writing and the patient given a copy highlighting the part that I personally wrote and discussed at today's ov.

## 2016-01-15 ENCOUNTER — Telehealth: Payer: Self-pay | Admitting: *Deleted

## 2016-01-15 NOTE — Telephone Encounter (Signed)
Oncology Nurse Navigator Documentation  Oncology Nurse Navigator Flowsheets 01/15/2016  Navigator Encounter Type Telephone  Telephone Outgoing Call  Treatment Phase Pre-Tx/Tx Discussion  Acuity Level 1  Time Spent with Patient 15   Called patient to update him on appt.

## 2016-01-18 ENCOUNTER — Other Ambulatory Visit: Payer: Self-pay | Admitting: Radiation Therapy

## 2016-01-18 ENCOUNTER — Other Ambulatory Visit: Payer: PRIVATE HEALTH INSURANCE

## 2016-01-18 ENCOUNTER — Encounter: Payer: PRIVATE HEALTH INSURANCE | Admitting: Cardiothoracic Surgery

## 2016-01-18 ENCOUNTER — Encounter: Payer: Self-pay | Admitting: Radiation Oncology

## 2016-01-18 ENCOUNTER — Ambulatory Visit
Admission: RE | Admit: 2016-01-18 | Discharge: 2016-01-18 | Disposition: A | Discharge status: Home / Self Care | Payer: PRIVATE HEALTH INSURANCE | Source: Ambulatory Visit | Attending: Radiation Oncology | Admitting: Radiation Oncology

## 2016-01-18 ENCOUNTER — Telehealth: Payer: Self-pay | Admitting: Internal Medicine

## 2016-01-18 ENCOUNTER — Ambulatory Visit (HOSPITAL_BASED_OUTPATIENT_CLINIC_OR_DEPARTMENT_OTHER): Payer: PRIVATE HEALTH INSURANCE | Admitting: Internal Medicine

## 2016-01-18 ENCOUNTER — Ambulatory Visit: Payer: PRIVATE HEALTH INSURANCE | Admitting: Internal Medicine

## 2016-01-18 ENCOUNTER — Ambulatory Visit: Payer: PRIVATE HEALTH INSURANCE | Admitting: Radiation Oncology

## 2016-01-18 ENCOUNTER — Encounter: Payer: Self-pay | Admitting: Cardiothoracic Surgery

## 2016-01-18 ENCOUNTER — Encounter: Payer: Self-pay | Admitting: *Deleted

## 2016-01-18 ENCOUNTER — Other Ambulatory Visit (HOSPITAL_BASED_OUTPATIENT_CLINIC_OR_DEPARTMENT_OTHER): Payer: PRIVATE HEALTH INSURANCE

## 2016-01-18 ENCOUNTER — Encounter: Payer: Self-pay | Admitting: Internal Medicine

## 2016-01-18 VITALS — BP 118/57 | HR 89 | Temp 97.6°F | Resp 20 | Ht 70.0 in | Wt 256.0 lb

## 2016-01-18 VITALS — BP 122/55 | HR 87 | Temp 97.6°F | Resp 18 | Ht 70.0 in | Wt 256.6 lb

## 2016-01-18 DIAGNOSIS — C7931 Secondary malignant neoplasm of brain: Secondary | ICD-10-CM

## 2016-01-18 DIAGNOSIS — C3491 Malignant neoplasm of unspecified part of right bronchus or lung: Secondary | ICD-10-CM

## 2016-01-18 DIAGNOSIS — C3490 Malignant neoplasm of unspecified part of unspecified bronchus or lung: Secondary | ICD-10-CM

## 2016-01-18 DIAGNOSIS — C787 Secondary malignant neoplasm of liver and intrahepatic bile duct: Secondary | ICD-10-CM | POA: Diagnosis not present

## 2016-01-18 DIAGNOSIS — C7951 Secondary malignant neoplasm of bone: Secondary | ICD-10-CM | POA: Diagnosis not present

## 2016-01-18 DIAGNOSIS — Z87891 Personal history of nicotine dependence: Secondary | ICD-10-CM | POA: Diagnosis not present

## 2016-01-18 DIAGNOSIS — C7989 Secondary malignant neoplasm of other specified sites: Secondary | ICD-10-CM

## 2016-01-18 DIAGNOSIS — E669 Obesity, unspecified: Secondary | ICD-10-CM | POA: Insufficient documentation

## 2016-01-18 DIAGNOSIS — J90 Pleural effusion, not elsewhere classified: Secondary | ICD-10-CM

## 2016-01-18 DIAGNOSIS — I481 Persistent atrial fibrillation: Secondary | ICD-10-CM | POA: Insufficient documentation

## 2016-01-18 DIAGNOSIS — C7949 Secondary malignant neoplasm of other parts of nervous system: Principal | ICD-10-CM

## 2016-01-18 DIAGNOSIS — I340 Nonrheumatic mitral (valve) insufficiency: Secondary | ICD-10-CM | POA: Diagnosis not present

## 2016-01-18 DIAGNOSIS — C3411 Malignant neoplasm of upper lobe, right bronchus or lung: Secondary | ICD-10-CM | POA: Insufficient documentation

## 2016-01-18 DIAGNOSIS — K449 Diaphragmatic hernia without obstruction or gangrene: Secondary | ICD-10-CM | POA: Insufficient documentation

## 2016-01-18 DIAGNOSIS — Z809 Family history of malignant neoplasm, unspecified: Secondary | ICD-10-CM

## 2016-01-18 DIAGNOSIS — I10 Essential (primary) hypertension: Secondary | ICD-10-CM | POA: Insufficient documentation

## 2016-01-18 DIAGNOSIS — Z510 Encounter for antineoplastic radiation therapy: Secondary | ICD-10-CM | POA: Insufficient documentation

## 2016-01-18 DIAGNOSIS — C778 Secondary and unspecified malignant neoplasm of lymph nodes of multiple regions: Secondary | ICD-10-CM

## 2016-01-18 DIAGNOSIS — C7800 Secondary malignant neoplasm of unspecified lung: Secondary | ICD-10-CM | POA: Diagnosis not present

## 2016-01-18 DIAGNOSIS — F411 Generalized anxiety disorder: Secondary | ICD-10-CM

## 2016-01-18 HISTORY — DX: Secondary malignant neoplasm of brain: C79.31

## 2016-01-18 HISTORY — DX: Malignant neoplasm of brain, unspecified: C71.9

## 2016-01-18 LAB — COMPREHENSIVE METABOLIC PANEL
ALT: 64 U/L — ABNORMAL HIGH (ref 0–55)
AST: 19 U/L (ref 5–34)
Albumin: 2.5 g/dL — ABNORMAL LOW (ref 3.5–5.0)
Alkaline Phosphatase: 76 U/L (ref 40–150)
Anion Gap: 5 mEq/L (ref 3–11)
BUN: 23.4 mg/dL (ref 7.0–26.0)
CHLORIDE: 105 meq/L (ref 98–109)
CO2: 27 meq/L (ref 22–29)
Calcium: 8.2 mg/dL — ABNORMAL LOW (ref 8.4–10.4)
Creatinine: 0.8 mg/dL (ref 0.7–1.3)
GLUCOSE: 228 mg/dL — AB (ref 70–140)
POTASSIUM: 5.1 meq/L (ref 3.5–5.1)
SODIUM: 137 meq/L (ref 136–145)
Total Bilirubin: <0.3 mg/dL (ref 0.20–1.20)
Total Protein: 6.1 g/dL — ABNORMAL LOW (ref 6.4–8.3)

## 2016-01-18 LAB — CBC WITH DIFFERENTIAL/PLATELET
BASO%: 0.1 % (ref 0.0–2.0)
BASOS ABS: 0 10*3/uL (ref 0.0–0.1)
EOS%: 1.2 % (ref 0.0–7.0)
Eosinophils Absolute: 0.4 10*3/uL (ref 0.0–0.5)
HCT: 44.2 % (ref 38.4–49.9)
HGB: 14.2 g/dL (ref 13.0–17.1)
LYMPH%: 7.2 % — ABNORMAL LOW (ref 14.0–49.0)
MCH: 28.7 pg (ref 27.2–33.4)
MCHC: 32.1 g/dL (ref 32.0–36.0)
MCV: 89.5 fL (ref 79.3–98.0)
MONO#: 0.9 10*3/uL (ref 0.1–0.9)
MONO%: 2.9 % (ref 0.0–14.0)
NEUT#: 28.2 10*3/uL — ABNORMAL HIGH (ref 1.5–6.5)
NEUT%: 88.6 % — ABNORMAL HIGH (ref 39.0–75.0)
NRBC: 0 % (ref 0–0)
Platelets: 111 10*3/uL — ABNORMAL LOW (ref 140–400)
RBC: 4.94 10*6/uL (ref 4.20–5.82)
RDW: 14.6 % (ref 11.0–14.6)
WBC: 31.8 10*3/uL — ABNORMAL HIGH (ref 4.0–10.3)
lymph#: 2.3 10*3/uL (ref 0.9–3.3)

## 2016-01-18 LAB — TECHNOLOGIST REVIEW

## 2016-01-18 MED ORDER — ALPRAZOLAM 0.25 MG PO TABS: 0.2500 mg | ORAL_TABLET | Freq: Every evening | ORAL | Status: AC | PRN

## 2016-01-18 NOTE — Progress Notes (Signed)
Please see the Nurse Progress Note in the MD Initial Consult Encounter for this patient. 

## 2016-01-18 NOTE — Progress Notes (Signed)
Oncology Nurse Navigator Documentation  Oncology Nurse Navigator Flowsheets 01/18/2016  Navigator Encounter Type Other  Barriers/Navigation Needs Coordination of Care  Interventions Coordination of Care  Acuity Level 2  Acuity Level 2 Other  Time Spent with Patient 70   Per Dr. Julien Nordmann, I called pathology.  I requested [facility] with PDL 1

## 2016-01-18 NOTE — Progress Notes (Signed)
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 MJulien Nordmannappt todday '@9'$ :15am  Dose of Decadron, if applicable: 4 mg or tablet three times  A day  Recent neurologic symptoms, if any:   Seizures:  No  Headaches: yes left fontal  Nausea: No  Dizziness/ataxia:  No  Difficulty with hand coordination: No  Focal numbness/weakness: Yes, toes numbness left foot from back surgery years ago(5-6)  Visual deficits/changes: No, but right eye has black ness there every now and trhen for year  Confusion/Memory deficits: No  Painful bone metastases at present, if any:  No SAFETY ISSUES: Yes, w/c,   Prior radiation? NO  Pacemaker/ICD?  NO  Is the patient on methotrexate?  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 MLegrand Como Wert,MD BP 118/57 mmHg  Pulse 89  Temp(Src) 97.6 F (36.4 C) (Oral)  Resp 20  Ht '5\' 10"'$  (1.778 m)  Wt 256 lb (116.121 kg)  BMI 36.73 kg/m2  SpO2 92%  Wt Readings from Last 3 Encounters:  01/18/16 256 lb (116.121 kg)  01/18/16 256 lb 9.6 oz (116.393 kg)  01/13/16 246 lb (111.585 kg)   Allergies:NKA

## 2016-01-18 NOTE — Progress Notes (Signed)
[facility] [phone]   [phone]  OFFICE PROGRESS NOTE  [name], MD [place] [address]  DIAGNOSIS: Stage IV (T3, N3, M1b) non-small cell lung cancer, adenocarcinoma, pending further diagnostic studies diagnosed in April 2017 and presented with large right upper and middle lobe lung mass in addition to extensive mediastinal and supraclavicular lymphadenopathy as well as liver, bone, brain and soft tissue lesions. Molecular studies are still pending.  PRIOR THERAPY: None  CURRENT THERAPY: None.  INTERVAL HISTORY: [name] 60 y.o. male returns to the clinic today for follow-up visit accompanied by wife and 2 daughters. The patient continues to have baseline shortness of breath and he is currently on oxygen. Unfortunately the recent MRI of the brain showed evidence for multiple metastatic brain lesions. He was started on Decadron 4 mg by mouth 3 times a day. He was also referred to Dr. Lisbeth Renshaw and expected to have simulation and start whole brain irradiation soon. The patient denied having any other significant complaints. He has no chest pain but has mild cough with no hemoptysis. He denied having any significant weight loss or night sweats. He has no nausea or vomiting. He is scheduled to see Dr. Servando Snare tomorrow for consideration of Pleurx catheter placement.  MEDICAL HISTORY: Past Medical History  Diagnosis Date  . HTN (hypertension)     pt unaware of this  . Persistent atrial fibrillation ([place])     now SR after DCCV   . Obesity   . H/O hiatal hernia   . Moderate mitral regurgitation   . Hyperthyroidism     treated with tapazole  . Brain cancer ([place]) 01/14/16    multiple brain metastases  . Non-small cell cancer of right lung ([place]) 01/11/2016    ALLERGIES:  has No Known Allergies.  MEDICATIONS:  Current Outpatient Prescriptions  Medication Sig Dispense Refill  . acetaminophen-codeine (TYLENOL  #3) 300-30 MG tablet One every 4 hours as needed for cough (Patient not taking: Reported on 01/18/2016) 100 tablet 0  . ALPRAZolam (XANAX) 0.5 MG tablet   0  . budesonide-formoterol (SYMBICORT) 80-4.5 MCG/ACT inhaler Take 2 puffs first thing in am and then another 2 puffs about 12 hours later. (Patient not taking: Reported on 01/18/2016)    . dexamethasone (DECADRON) 4 MG tablet Take 1 tablet (4 mg total) by mouth 3 (three) times daily. (Patient not taking: Reported on 01/18/2016) 40 tablet 0  . methimazole (TAPAZOLE) 5 MG tablet Take 1 tablet (5 mg total) by mouth daily. (Patient not taking: Reported on 01/18/2016) 90 tablet 0  . metoprolol (LOPRESSOR) 50 MG tablet Take 0.5 tablets (25 mg total) by mouth 2 (two) times daily. (Patient not taking: Reported on 01/18/2016) 180 tablet 2  . omeprazole (PRILOSEC) 40 MG capsule Take 1 capsule (40 mg total) by mouth daily. (Patient not taking: Reported on 01/18/2016) 90 capsule 1  . OXYGEN Reported on 01/18/2016    . promethazine-codeine (PHENERGAN WITH CODEINE) 6.25-10 MG/5ML syrup   0   No current facility-administered medications for this visit.    SURGICAL HISTORY:  Past Surgical History  Procedure Laterality Date  . Lower back surgery      herniated nucleus pulposus left L5-S1; semihemilaminectomy and diskectomy with microdissection with microscope  . Right cornea repair      secondary to injury from a bungee cord  . Tonsillectomy    . Cardioversion N/A 05/08/2013    unsuccessful for persistent afib  .  Tee without cardioversion N/A 06/24/2014    Procedure: TRANSESOPHAGEAL ECHOCARDIOGRAM (TEE);  Surgeon: Josue Hector, MD;  Location: [facility];  Service: Cardiovascular;  Laterality: N/A;  . Cardioversion N/A 04/09/2015    Procedure: CARDIOVERSION;  Surgeon: Josue Hector, MD;  Location: [facility] ENDOSCOPY;  Service: Cardiovascular;  Laterality: N/A;  . Video bronchoscopy Bilateral 01/01/2016    Procedure: VIDEO BRONCHOSCOPY WITH FLUORO;  Surgeon: Tanda Rockers, MD;  Location: [facility] ENDOSCOPY;  Service: Cardiopulmonary;  Laterality: Bilateral;  . Thoracentesis Right 01/10/16& 01/13/16    01/09/14= x1 liter , 01/13/16=x 2 liters    REVIEW OF SYSTEMS:  Constitutional: positive for fatigue Eyes: negative Ears, nose, mouth, throat, and face: negative Respiratory: positive for cough and dyspnea on exertion Cardiovascular: negative Gastrointestinal: negative Genitourinary:negative Integument/breast: negative Hematologic/lymphatic: negative Musculoskeletal:negative Neurological: negative Behavioral/Psych: negative Endocrine: negative Allergic/Immunologic: negative   PHYSICAL EXAMINATION: General appearance: alert, cooperative, fatigued and no distress Head: Normocephalic, without obvious abnormality, atraumatic Neck: no adenopathy, no JVD, supple, symmetrical, trachea midline and thyroid not enlarged, symmetric, no tenderness/mass/nodules Lymph nodes: Cervical, supraclavicular, and axillary nodes normal. Resp: diminished breath sounds RLL and dullness to percussion RLL Back: symmetric, no curvature. ROM normal. No CVA tenderness. Cardio: regular rate and rhythm, S1, S2 normal, no murmur, click, rub or gallop GI: soft, non-tender; bowel sounds normal; no masses,  no organomegaly Extremities: extremities normal, atraumatic, no cyanosis or edema Neurologic: Alert and oriented X 3, normal strength and tone. Normal symmetric reflexes. Normal coordination and gait  ECOG PERFORMANCE STATUS: 1 - Symptomatic but completely ambulatory  Blood pressure 122/55, pulse 87, temperature 97.6 F (36.4 C), temperature source Oral, resp. rate 18, height _0  (1.778 m), weight 256 lb 9.6 oz (116.393 kg), SpO2 90 %.  LABORATORY DATA: Lab Results  Component Value Date   WBC 23.9* 01/11/2016   HGB 14.6 01/11/2016   HCT 45.5 01/11/2016   MCV 90.1 01/11/2016   PLT 139* 01/11/2016      Chemistry      Component Value Date/Time   NA 137 01/11/2016 0456   K  4.6 01/11/2016 0456   CL 102 01/11/2016 0456   CO2 26 01/11/2016 0456   BUN 16 01/11/2016 0456   CREATININE 1.03 01/11/2016 0456   CREATININE 0.61 05/29/2014 1659      Component Value Date/Time   CALCIUM 7.7* 01/11/2016 0456   ALKPHOS 59 01/10/2016 0435   AST 17 01/10/2016 0435   ALT 17 01/10/2016 0435   BILITOT 0.7 01/10/2016 0435       RADIOGRAPHIC STUDIES: Dg Chest 1 View  01/10/2016  CLINICAL DATA:  Patient status post right thoracentesis. EXAM: CHEST 1 VIEW COMPARISON:  Chest radiograph 01/09/2016 FINDINGS: Monitoring leads overlie the patient. Stable cardiac and mediastinal contours with large right hilar mass. Interval decrease in size of small right pleural effusion. Grossly unchanged diffuse airspace opacities throughout the right hemi thorax. Minimal atelectasis left lung base. IMPRESSION: Interval decrease in size of now small right pleural effusion. Grossly unchanged diffuse bilateral airspace opacities throughout the right hemi thorax. Re- demonstrated large right pulmonary mass. Electronically Signed   By: Lovey Newcomer M.D.   On: 01/10/2016 14:07   Dg Chest 2 View  01/13/2016  CLINICAL DATA:  Right pleural effusion EXAM: CHEST  2 VIEW COMPARISON:  01/11/2016 FINDINGS: Right pleural effusion is slightly larger. Right upper lobe mass is stable. No pneumothorax. Normal heart size. IMPRESSION: Right pleural effusion is larger.  Stable right lung mass. Electronically Signed   By:  Marybelle Killings M.D.   On: 01/13/2016 14:30   Dg Chest 2 View  01/11/2016  CLINICAL DATA:  Evaluate pleural effusion EXAM: CHEST  2 VIEW COMPARISON:  01/10/2016 FINDINGS: Cardiomediastinal silhouette is stable. Large right upper lobe/ perihilar mass again noted. There is small right pleural effusion with right basilar atelectasis. Left lung is clear. No pulmonary edema. IMPRESSION: Large right upper lobe/ perihilar mass again noted. There is small right pleural effusion with right basilar atelectasis. Left lung  is clear. Electronically Signed   By: Lahoma Crocker M.D.   On: 01/11/2016 09:25   Dg Chest 2 View  01/09/2016  CLINICAL DATA:  60 year old male with a history of shortness of breath EXAM: CHEST - 2 VIEW COMPARISON:  01/08/2016 FINDINGS: Cardiomediastinal silhouette likely unchanged with the right heart border obscured by overlying lung/pleural disease. Left lung remains relatively well aerated, with minimal interstitial opacities. No pneumothorax. Improved opacity on the right with improved aeration of the right upper lung. Pleural parenchymal thickening with dense opacity at the right base persists. No displaced fracture. No displaced fracture. IMPRESSION: Improved aeration on the right, with persisting pleural parenchymal thickening and dense opacity at the right base compatible with combination of known tumor and pleural effusion. No pneumothorax. Left lung relatively well aerated. Signed, Dulcy Fanny. Earleen Newport, DO [facility] Electronically Signed   By: Corrie Mckusick D.O.   On: 01/09/2016 18:01   Dg Chest 2 View  01/08/2016  CLINICAL DATA:  Follow-up of thoracentesis; patient coughing, history of hypertension and stage IV right lung malignancy with metastases EXAM: CHEST  2 VIEW COMPARISON:  PET-CT study of today's date and CT scan of the chest of December 31, 2015 FINDINGS: The right hemi thorax is largely opacified. A small amount of aerated lung persists in the perihilar region. There is mild shift of the mediastinum toward the right. The left lung is well-expanded without focal infiltrate. The heart is normal in size where visualized. The bony thorax exhibits no acute abnormality. IMPRESSION: Near-total opacification of the right hemithorax consistent with large pleural effusion as well as atelectasis and known right upper lobe mass. No pneumothorax is observed. The opacification has progressed markedly since 31 December 2015. The left lung is clear.  Electronically Signed   By: David  Martinique M.D.   On: 01/08/2016 15:31   Dg Chest 2 View  12/28/2015  CLINICAL DATA:  Cough with SOB and right sided chest pain x 6 weeks, HTN, hx Cardioversion 04/2015, there are no other chest complaints EXAM: CHEST - 2 VIEW COMPARISON:  05/29/2014 FINDINGS: Masslike consolidation at the right hilum extending into the right upper lobe. There is mild mass effect upon the trachea and right main mainstem bronchus with mild narrowing. Left lung remains clear. Heart size normal. Small right pleural effusion has developed. Sclerotic change in a low thoracic vertebral body was not conspicuous on the prior study and may represent metastatic disease or Paget's disease. IMPRESSION: 1. Right hilar masslike consolidation with adjacent consolidation of the right upper lobe, favor neoplasm over pneumonia given the mass effect on the mediastinal structures. Possible lower thoracic vertebral metastasis. Small right effusion. Recommend CT chest with contrast for further characterization and staging. These results will be called to the ordering clinician or representative by the Radiologist Assistant, and communication documented in the PACS or zVision Dashboard. Electronically Signed   By: Lucrezia Europe M.D.   On: 12/28/2015 12:31   Ct Chest W Contrast  12/31/2015  CLINICAL DATA:  Cough x6 weeks, abnormal chest radiograph EXAM: CT CHEST WITH CONTRAST TECHNIQUE: Multidetector CT imaging of the chest was performed during intravenous contrast administration. CONTRAST:  55m ISOVUE-300 IOPAMIDOL (ISOVUE-300) INJECTION 61% COMPARISON:  Chest radiographs dated 12/28/2015 FINDINGS: Mediastinum/Nodes: The heart is normal in size. No pericardial effusion. Coronary atherosclerosis in the LAD. Atherosclerotic calcifications of the aortic arch. Thoracic lymphadenopathy, including: --8 mm short axis left supraclavicular node (series 2/ image 5) --2.8 cm short axis high right paratracheal node (series 2/ image  46) --10 mm short axis AP window node (series 2/image 54) --3.5 cm short axis low right paratracheal node (series 2/ image 56) --1.8 cm short axis subcarinal node (series 2/ image 67) Lungs/Pleura: 11.9 x 8.0 x 11.4 cm mass in the medial right upper and middle lobes (series 2/image 47), compatible with primary bronchogenic neoplasm. Mass abuts the medial pleural surface, occludes the right mainstem bronchus (series 2/image 60), and directly extends into the right perihilar region and mediastinum. Secondary ground-glass opacity with increased interstitial markings in the right middle lobe and right lower lobe (series 5/image 81), likely reflecting lymphangitic spread of tumor, less likely postobstructive opacity. Additional satellite nodularity in the right middle and lower lobes (for example, series 5/images 70, 83, 91, 92, and 105), measuring up to 8 mm, suspicious for metastases. Moderate to large right pleural effusion. Pleural-based nodularity (series 2/ image 121), suggesting malignant effusion. Associated compressive atelectasis in the right lower lobe. Left lung is notable for mild paraseptal emphysematous changes. No pneumothorax. Upper abdomen: Visualized upper abdomen is notable for a 10 mm hypoenhancing lesion in the right hepatic dome (series 2/image 115), indeterminate. Additional 10 mm hypoenhancing lesion in the central left hepatic lobe (series 2/ image 139). No evidence of adrenal metastases. Small upper abdominal lymph nodes measuring up to 9 mm short axis (series 2/ image 156), within normal limits. Musculoskeletal: Mild degenerative changes of the lower thoracic spine. No focal osseous lesions. IMPRESSION: 11.9 cm mass in the medial right upper and middle lobes, compatible with primary bronchogenic neoplasm. Mass occludes the right mainstem bronchus, extends to the right perihilar region, and abuts/invades the mediastinum. Suspected lymphangitic spread of tumor in the right middle and lower  lobes, less likely postobstructive opacity. Associated satellite nodularity in the right middle and lower lobes measuring up to 8 mm, suspicious for pulmonary metastases. Associated left supraclavicular and mediastinal nodal metastases. Moderate to large right pleural effusion with associated pleural-based nodularity, likely malignant. Two 10 mm hypoenhancing lesions in the liver, indeterminate. Electronically Signed   By: SJulian HyM.D.   On: 12/31/2015 15:27   Mr BJeri CosWHYContrast  01/14/2016  ADDENDUM REPORT: 01/14/2016 11:32 ADDENDUM: Study discussed by telephone with Dr. MCurt Bearson 01/14/2016 At 1127 hours. Electronically Signed   By: HGenevie AnnM.D.   On: 01/14/2016 11:32  01/14/2016  CLINICAL DATA:  60year old male with recently diagnosed stage IV (T3, N3, M1b) non-small cell lung cancer. Staging. Subsequent encounter. EXAM: MRI HEAD WITHOUT AND WITH CONTRAST TECHNIQUE: Multiplanar, multiecho pulse sequences of the brain and surrounding structures were obtained without and with intravenous contrast. CONTRAST:  20 mL MultiHance. COMPARISON:  PET-CT 01/08/2016 FINDINGS: Post-contrast images are degraded by motion artifact despite repeated imaging attempts. There are multiple subcentimeter enhancing lesions in the bilateral cerebral hemispheres most compatible with brain metastases. There is a solitary 16 mm rim enhancing metastasis in the anterior right frontal lobe with surrounding cerebral edema (series 12, image 27 and series 8, image  15). There is also confluent cerebral edema in the left anterior frontal lobe related to a 9 mm metastasis there are (series 8, image 17 and series 12, image 31. There is a solitary punctate metastasis suspected in the posterior superior left cerebellum. In all about 14 small metastases are identified. No dural thickening identified. However, there are also multiple small nonenhancing areas of restricted diffusion in both cerebral and cerebellar hemispheres  (series 5). None of these however appears to be enhancing following contrast, such that these areas are most compatible with small scattered acute infarcts. The larger of these have T2 hyperintensity, including in the left splenium of the corpus callosum (series 7, image 16, seen to be nonenhancing on series 12, image 30). There is a superimposed small chronic left cerebellar lacunar infarct. No midline shift or loss of basilar cisterns. No acute intracranial hemorrhage identified. No ventriculomegaly. Major intracranial vascular flow voids are preserved. The left vertebral artery appears dominant. Negative pituitary and cervicomedullary junction. Grossly negative visualized cervical spinal cord. Mildly to moderately heterogeneous bone marrow signal throughout the visible skull and upper cervical spine. Of these, there is associated abnormal diffusion at the left occipital condyle compatible with bone metastasis and this was hypermetabolic on PET-CT. A right side C2-C3 bone metastasis was better demonstrated by PET. Visible internal auditory structures appear normal. Mastoids are clear. Paranasal sinuses are clear. Negative orbit and scalp soft tissues. IMPRESSION: 1. Positive for multiple small brain metastases. Approximately fourteen enhancing metastases are identified ranging from punctate to 16 mm. There is associated cerebral edema, most pronounced in the anterior frontal lobes, without significant intracranial mass effect. 2. There also are numerous superimposed small nonenhancing areas of restricted diffusion in both cerebral and cerebellar hemispheres, but these are most compatible with small acute embolic infarcts. 3. Left occipital condyle skullbase metastasis. Cervical spine metastatic disease better demonstrated on recent PET-CT. Electronically Signed: By: Genevie Ann M.D. On: 01/14/2016 11:22   Nm Pet Image Initial (pi) Skull Base To Thigh  01/08/2016  CLINICAL DATA:  Initial treatment strategy for lung  mass. EXAM: NUCLEAR MEDICINE PET SKULL BASE TO THIGH TECHNIQUE: 12.86 mCi F-18 FDG was injected intravenously. Full-ring PET imaging was performed from the skull base to thigh after the radiotracer. CT data was obtained and used for attenuation correction and anatomic localization. FASTING BLOOD GLUCOSE:  Value: 126 mg/dl COMPARISON:  CT chest dated 12/31/2015 FINDINGS: NECK No hypermetabolic lymph nodes in the neck. Two dominant lesions in the left thyroid gland, mid representative max SUV 13.5, nonspecific. CHEST Right upper and middle lobe mass, max SUV 16.0, compatible with primary bronchogenic neoplasm. This was better evaluated on recent CT, when it measured approximately 11.9 x 8.0 x 11.4 cm. Associated near complete collapse of the right lung secondary to a large right pleural effusion, malignant, with multiple enhancing pleural-based nodules, representative max SUV 16.2. Left lung is grossly clear.  No pneumothorax. Widespread thoracic lymphadenopathy, max SUV 16.6 in the subcarinal region. 9 mm short axis left supraclavicular nodes are present with max SUV 9.1. The heart is normal in size.  Trace pericardial fluid. ABDOMEN/PELVIS Motion degraded images. Three hepatic metastases, including a 2.1 cm lesion in segment 7 with max SUV 20.1 and a 1.5 cm lesion in segment 4A with max SUV 14.7. No abnormal hypermetabolic activity within the pancreas, adrenal glands, or spleen. No hypermetabolic lymph nodes in the abdomen or pelvis. SKELETON Innumerable intramuscular and subcutaneous soft tissue metastases. For example, representative max SUV 17.2 along the posterior  left shoulder and representative max SUV 15.2 in the left gluteal region. Multifocal osseous metastases in the visualized axial and appendicular skeleton. For example: --Left clivus/skull base, max SUV 18.0 --Lower cervical spine, max SUV 6.0 --Left acromion, max SUV 8.2 --Lower thoracic spine, max SUV 5.4 --Left proximal femur, max SUV 15.0  IMPRESSION: Stage IV right lung cancer, with widespread nodal, pleural, hepatic, soft tissue, and osseous metastases, as above. Electronically Signed   By: Julian Hy M.D.   On: 01/08/2016 09:53   US Thoracentesis Asp Pleural Space W/img Guide  01/10/2016  INDICATION: Patient with recent bronchoscopy revealing atypical cells, dyspnea, recurrent right pleural effusion. Request made for diagnostic and therapeutic right thoracentesis. EXAM: ULTRASOUND GUIDED DIAGNOSTIC AND THERAPEUTIC RIGHT THORACENTESIS MEDICATIONS: None. COMPLICATIONS: None immediate. PROCEDURE: An ultrasound guided thoracentesis was thoroughly discussed with the patient and questions answered. The benefits, risks, alternatives and complications were also discussed. The patient understands and wishes to proceed with the procedure. Written consent was obtained. Ultrasound was performed to localize and mark an adequate pocket of fluid in the right chest. The area was then prepped and draped in the normal sterile fashion. 1% Lidocaine was used for local anesthesia. Under ultrasound guidance a Safe-T-Centesis catheter was introduced. Thoracentesis was performed. The catheter was removed and a dressing applied. FINDINGS: A total of approximately 2.3 liters of turbid, amber fluid was removed. Samples were sent to the laboratory as requested by the clinical team. Only the above amount of fluid was removed at this time secondary to persistent patient coughing . IMPRESSION: Successful ultrasound guided diagnostic and therapeutic right thoracentesis yielding 2.3 liters of pleural fluid. Read by: Rowe Robert, PA-C Electronically Signed   By: Aletta Edouard M.D.   On: 01/10/2016 12:57    ASSESSMENT AND PLAN: This is a very pleasant 60 years old white male with a stage IV non-small cell lung cancer, adenocarcinoma with extensive metastasis to the lung mediastinal and supraclavicular lymph node as well as liver, bone, brain and soft tissue lesions.  This was diagnosed in April 2017. The molecular studies are still pending. I would ask the pathology department to send the tissue block to be tested for molecular mutation in addition to PDL 1 expression. I will also arrange for the patient to have plasma tested for EGFR mutation performed today. For the multiple metastatic brain lesion, the patient will proceed with palliative whole brain irradiation under the care of Dr. Lisbeth Renshaw. I also recommended for the patient to continue Decadron for now. I discussed with the patient his family his prognosis with and without treatment. I will arrange for him to come back for follow-up visit in 2 weeks for reevaluation and more detailed discussion of his treatment options based on the molecular studies after completion of the brain irradiation. For the recurrent right pleural effusion, the patient is expected to see Dr. Servando Snare tomorrow for consideration of Pleurx catheter placement. For anxiety, I started the patient on Xanax 0.25 mg by mouth 3 times a day as needed. The patient voices understanding of current disease status and treatment options and is in agreement with the current care plan.  All questions were answered. The patient knows to call the clinic with any problems, questions or concerns. We can certainly see the patient much sooner if necessary.  I spent 20 minutes counseling the patient face to face. The total time spent in the appointment was 30 minutes.  Disclaimer: This note was dictated with voice recognition software. Similar sounding words can inadvertently  be transcribed and may not be corrected upon review.

## 2016-01-18 NOTE — Progress Notes (Signed)
Radiation Oncology         [phone] ________________________________  Name: [name] MRN: [id number]  Date: 01/18/2016  DOB: Dec 09, 1955  IO:[id number] Jason Muse [name], MD  Curt Bears, MD     REFERRING PHYSICIAN: Curt Bears, MD   DIAGNOSIS: The primary encounter diagnosis was Brain metastasis [facility]). A diagnosis of Non-small cell cancer of right lung [facility]) was also pertinent to this visit. Stage IV, (T3, N3, M1b) non-small cell, adenocarcinoma of the right lung.   HISTORY OF PRESENT ILLNESS:[name] is a 60 y.o. male who is seen for an initial consultation visit. He was referred to a pulmonologist by his primary care physician 12/30/2015 because of an abnormal chest x-ray and bad cough. He had a CT scan 12/31/2015, revealing an 11.9 cm mass in the medial right upper and middle lobes and suspected lymphangitic spread of tumor in the right middle and lower lobes. He had a PET scan 01/08/2016, revealing a right upper and middle lobe mass with a max SUV of 16.0, multiple enhancing pleural-based nodules in the right lung with a max SUV 16.2, widespread thoracic lymphadenopathy with a max SUV of 16.6 in the subcarinal region, three hepatic metastases with a max SUV of 20.1, innumerable soft tissue metastases with a max SUV of 17.2 in the posterior left shoulder and max SUV of 15.2 in the left gluteal region, and multiple osseous metastases.  Staging MRI of the brain on 01/14/16 showed evidence for multiple metastatic brain lesions. He was started Decadron 4 mg three times daily 01/14/2016. He comes to meet with Dr. Lisbeth Renshaw for consideration of radiotherapy.    PREVIOUS RADIATION THERAPY: No   PAST MEDICAL HISTORY:  Past Medical History  Diagnosis Date  . HTN (hypertension)     pt unaware of this  . Persistent atrial fibrillation ([place])     now SR after DCCV   . Obesity   . H/O hiatal hernia   . Moderate mitral regurgitation   . Hyperthyroidism     treated with tapazole  .  Brain cancer ([place]) 01/14/16    multiple brain metastases  . Non-small cell cancer of right lung ([place]) 01/11/2016  . Brain metastasis ([place]) 01/18/2016      PAST SURGICAL HISTORY: Past Surgical History  Procedure Laterality Date  . Lower back surgery      herniated nucleus pulposus left L5-S1; semihemilaminectomy and diskectomy with microdissection with microscope  . Right cornea repair      secondary to injury from a bungee cord  . Tonsillectomy    . Cardioversion N/A 05/08/2013    unsuccessful for persistent afib  . Tee without cardioversion N/A 06/24/2014    Procedure: TRANSESOPHAGEAL ECHOCARDIOGRAM (TEE);  Surgeon: Josue Hector, MD;  Location: [facility];  Service: Cardiovascular;  Laterality: N/A;  . Cardioversion N/A 04/09/2015    Procedure: CARDIOVERSION;  Surgeon: Josue Hector, MD;  Location: [facility] ENDOSCOPY;  Service: Cardiovascular;  Laterality: N/A;  . Video bronchoscopy Bilateral 01/01/2016    Procedure: VIDEO BRONCHOSCOPY WITH FLUORO;  Surgeon: Tanda Rockers, MD;  Location: [facility] ENDOSCOPY;  Service: Cardiopulmonary;  Laterality: Bilateral;  . Thoracentesis Right 01/10/16& 01/13/16    01/09/14= x1 liter , 01/13/16=x 2 liters     FAMILY HISTORY: Family History  Problem Relation Age of Onset  . Diabetes Brother   . Hyperlipidemia Mother   . Cancer Father 35    cancer unknown primary  . Thyroid disease Mother     SOCIAL HISTORY:  reports that  he quit smoking about 15 years ago. His smoking use included Cigarettes. He has a 40.5 pack-year smoking history. He quit smokeless tobacco use about 13 years ago. He reports that he does not drink alcohol or use illicit drugs. He's married and lives in [place], [place]. He works as a Administrator.   ALLERGIES: Review of patient's allergies indicates no known allergies.   MEDICATIONS:  Current Outpatient Prescriptions  Medication Sig Dispense Refill  . acetaminophen-codeine (TYLENOL #3) 300-30 MG tablet One every 4 hours as needed for cough  (Patient not taking: Reported on 01/18/2016) 100 tablet 0  . ALPRAZolam (XANAX) 0.25 MG tablet Take 1 tablet (0.25 mg total) by mouth at bedtime as needed for anxiety. (Patient not taking: Reported on 01/18/2016) 30 tablet 0  . budesonide-formoterol (SYMBICORT) 80-4.5 MCG/ACT inhaler Take 2 puffs first thing in am and then another 2 puffs about 12 hours later. (Patient not taking: Reported on 01/18/2016)    . dexamethasone (DECADRON) 4 MG tablet Take 1 tablet (4 mg total) by mouth 3 (three) times daily. (Patient not taking: Reported on 01/18/2016) 40 tablet 0  . methimazole (TAPAZOLE) 5 MG tablet Take 1 tablet (5 mg total) by mouth daily. (Patient not taking: Reported on 01/18/2016) 90 tablet 0  . metoprolol (LOPRESSOR) 50 MG tablet Take 0.5 tablets (25 mg total) by mouth 2 (two) times daily. (Patient not taking: Reported on 01/18/2016) 180 tablet 2  . omeprazole (PRILOSEC) 40 MG capsule Take 1 capsule (40 mg total) by mouth daily. (Patient not taking: Reported on 01/18/2016) 90 capsule 1  . OXYGEN Reported on 01/18/2016    . promethazine-codeine (PHENERGAN WITH CODEINE) 6.25-10 MG/5ML syrup   0   No current facility-administered medications for this encounter.     REVIEW OF SYSTEMS:  On review of systems, the patient reports that he is doing well overall. He denies any chest pain, fevers, chills, night sweats, unintended weight changes. He mentions he does have shortness of breath on exertion and has a cough with occasional production of thick white mucus. He mentions he has a headache every once in a while above his left eye. He reports his headaches are not long lasting and do no bother him enough to take medication. He denies any bowel or bladder disturbances, and denies abdominal pain, nausea or vomiting. He denies any new musculoskeletal or joint aches or pains. A complete review of systems is obtained and is otherwise negative.  PHYSICAL EXAM:  height is '5\' 10"'$  (1.778 m) and weight is 256 lb (116.121  kg). His oral temperature is 97.6 F (36.4 C). His blood pressure is 118/57 and his pulse is 89. His respiration is 20 and oxygen saturation is 92%.    In general this is a well appearing caucasian male in no acute distress. He is alert and oriented x4 and appropriate throughout the examination. HEENT reveals that the patient is normocephalic, atraumatic. EOMs are intact. PERRLA. Skin is intact without any evidence of gross lesions. Cardiovascular exam reveals a regular rate and rhythm, no clicks rubs or murmurs are auscultated. Chest is clear to auscultation bilaterally. Lymphatic assessment is performed and does reveal palpable adenopathy of the right axillary region, right supraclavicular region without adenopathy of the deep cervical chains, left axillary or bilateral inguinal chains. He does have a palp subcutaneous lesion along the upper border of the left scapula. Abdomen has active bowel sounds in all quadrants and is intact. The abdomen is soft, non tender, non distended. Lower extremities are  negative for pretibial pitting edema, deep calf tenderness, cyanosis or clubbing.    ECOG = 2  0 - Asymptomatic (Fully active, able to carry on all predisease activities without restriction)  1 - Symptomatic but completely ambulatory (Restricted in physically strenuous activity but ambulatory and able to carry out work of a light or sedentary nature. For example, light housework, office work)  2 - Symptomatic, <50% in bed during the day (Ambulatory and capable of all self care but unable to carry out any work activities. Up and about more than 50% of waking hours)  3 - Symptomatic, >50% in bed, but not bedbound (Capable of only limited self-care, confined to bed or chair 50% or more of waking hours)  4 - Bedbound (Completely disabled. Cannot carry on any self-care. Totally confined to bed or chair)  5 - Death   Eustace Pen MM, Creech RH, Tormey DC, et al. [phone]). "Toxicity and response criteria of the  [facility]". [place] Oncol. 5 (6): 649-55     LABORATORY DATA:  Lab Results  Component Value Date   WBC 31.8* 01/18/2016   HGB 14.2 01/18/2016   HCT 44.2 01/18/2016   MCV 89.5 01/18/2016   PLT 111* 01/18/2016   Lab Results  Component Value Date   NA 137 01/18/2016   K 5.1 01/18/2016   CL 102 01/11/2016   CO2 27 01/18/2016   Lab Results  Component Value Date   ALT 64* 01/18/2016   AST 19 01/18/2016   ALKPHOS 76 01/18/2016   BILITOT <0.30 01/18/2016      RADIOGRAPHY: Dg Chest 1 View  01/10/2016  CLINICAL DATA:  Patient status post right thoracentesis. EXAM: CHEST 1 VIEW COMPARISON:  Chest radiograph 01/09/2016 FINDINGS: Monitoring leads overlie the patient. Stable cardiac and mediastinal contours with large right hilar mass. Interval decrease in size of small right pleural effusion. Grossly unchanged diffuse airspace opacities throughout the right hemi thorax. Minimal atelectasis left lung base. IMPRESSION: Interval decrease in size of now small right pleural effusion. Grossly unchanged diffuse bilateral airspace opacities throughout the right hemi thorax. Re- demonstrated large right pulmonary mass. Electronically Signed   By: Lovey Newcomer M.D.   On: 01/10/2016 14:07   Dg Chest 2 View  01/13/2016  CLINICAL DATA:  Right pleural effusion EXAM: CHEST  2 VIEW COMPARISON:  01/11/2016 FINDINGS: Right pleural effusion is slightly larger. Right upper lobe mass is stable. No pneumothorax. Normal heart size. IMPRESSION: Right pleural effusion is larger.  Stable right lung mass. Electronically Signed   By: Marybelle Killings M.D.   On: 01/13/2016 14:30   Dg Chest 2 View  01/11/2016  CLINICAL DATA:  Evaluate pleural effusion EXAM: CHEST  2 VIEW COMPARISON:  01/10/2016 FINDINGS: Cardiomediastinal silhouette is stable. Large right upper lobe/ perihilar mass again noted. There is small right pleural effusion with right basilar atelectasis. Left lung is clear. No pulmonary  edema. IMPRESSION: Large right upper lobe/ perihilar mass again noted. There is small right pleural effusion with right basilar atelectasis. Left lung is clear. Electronically Signed   By: Lahoma Crocker M.D.   On: 01/11/2016 09:25   Dg Chest 2 View  01/09/2016  CLINICAL DATA:  60 year old male with a history of shortness of breath EXAM: CHEST - 2 VIEW COMPARISON:  01/08/2016 FINDINGS: Cardiomediastinal silhouette likely unchanged with the right heart border obscured by overlying lung/pleural disease. Left lung remains relatively well aerated, with minimal interstitial opacities. No pneumothorax. Improved opacity on the right with improved  aeration of the right upper lung. Pleural parenchymal thickening with dense opacity at the right base persists. No displaced fracture. No displaced fracture. IMPRESSION: Improved aeration on the right, with persisting pleural parenchymal thickening and dense opacity at the right base compatible with combination of known tumor and pleural effusion. No pneumothorax. Left lung relatively well aerated. Signed, Dulcy Fanny. Earleen Newport, DO [facility] Electronically Signed   By: Corrie Mckusick D.O.   On: 01/09/2016 18:01   Dg Chest 2 View  01/08/2016  CLINICAL DATA:  Follow-up of thoracentesis; patient coughing, history of hypertension and stage IV right lung malignancy with metastases EXAM: CHEST  2 VIEW COMPARISON:  PET-CT study of today's date and CT scan of the chest of December 31, 2015 FINDINGS: The right hemi thorax is largely opacified. A small amount of aerated lung persists in the perihilar region. There is mild shift of the mediastinum toward the right. The left lung is well-expanded without focal infiltrate. The heart is normal in size where visualized. The bony thorax exhibits no acute abnormality. IMPRESSION: Near-total opacification of the right hemithorax consistent with large pleural effusion as well as atelectasis and  known right upper lobe mass. No pneumothorax is observed. The opacification has progressed markedly since 31 December 2015. The left lung is clear. Electronically Signed   By: David  Martinique M.D.   On: 01/08/2016 15:31   Dg Chest 2 View  12/28/2015  CLINICAL DATA:  Cough with SOB and right sided chest pain x 6 weeks, HTN, hx Cardioversion 04/2015, there are no other chest complaints EXAM: CHEST - 2 VIEW COMPARISON:  05/29/2014 FINDINGS: Masslike consolidation at the right hilum extending into the right upper lobe. There is mild mass effect upon the trachea and right main mainstem bronchus with mild narrowing. Left lung remains clear. Heart size normal. Small right pleural effusion has developed. Sclerotic change in a low thoracic vertebral body was not conspicuous on the prior study and may represent metastatic disease or Paget's disease. IMPRESSION: 1. Right hilar masslike consolidation with adjacent consolidation of the right upper lobe, favor neoplasm over pneumonia given the mass effect on the mediastinal structures. Possible lower thoracic vertebral metastasis. Small right effusion. Recommend CT chest with contrast for further characterization and staging. These results will be called to the ordering clinician or representative by the Radiologist Assistant, and communication documented in the PACS or zVision Dashboard. Electronically Signed   By: Lucrezia Europe M.D.   On: 12/28/2015 12:31   Ct Chest W Contrast  12/31/2015  CLINICAL DATA:  Cough x6 weeks, abnormal chest radiograph EXAM: CT CHEST WITH CONTRAST TECHNIQUE: Multidetector CT imaging of the chest was performed during intravenous contrast administration. CONTRAST:  58m ISOVUE-300 IOPAMIDOL (ISOVUE-300) INJECTION 61% COMPARISON:  Chest radiographs dated 12/28/2015 FINDINGS: Mediastinum/Nodes: The heart is normal in size. No pericardial effusion. Coronary atherosclerosis in the LAD. Atherosclerotic calcifications of the aortic arch. Thoracic  lymphadenopathy, including: --8 mm short axis left supraclavicular node (series 2/ image 5) --2.8 cm short axis high right paratracheal node (series 2/ image 46) --10 mm short axis AP window node (series 2/image 54) --3.5 cm short axis low right paratracheal node (series 2/ image 56) --1.8 cm short axis subcarinal node (series 2/ image 67) Lungs/Pleura: 11.9 x 8.0 x 11.4 cm mass in the medial right upper and middle lobes (series 2/image 47), compatible with primary bronchogenic neoplasm. Mass abuts the medial pleural surface, occludes the right mainstem bronchus (series 2/image 60), and directly extends  into the right perihilar region and mediastinum. Secondary ground-glass opacity with increased interstitial markings in the right middle lobe and right lower lobe (series 5/image 81), likely reflecting lymphangitic spread of tumor, less likely postobstructive opacity. Additional satellite nodularity in the right middle and lower lobes (for example, series 5/images 70, 83, 91, 92, and 105), measuring up to 8 mm, suspicious for metastases. Moderate to large right pleural effusion. Pleural-based nodularity (series 2/ image 121), suggesting malignant effusion. Associated compressive atelectasis in the right lower lobe. Left lung is notable for mild paraseptal emphysematous changes. No pneumothorax. Upper abdomen: Visualized upper abdomen is notable for a 10 mm hypoenhancing lesion in the right hepatic dome (series 2/image 115), indeterminate. Additional 10 mm hypoenhancing lesion in the central left hepatic lobe (series 2/ image 139). No evidence of adrenal metastases. Small upper abdominal lymph nodes measuring up to 9 mm short axis (series 2/ image 156), within normal limits. Musculoskeletal: Mild degenerative changes of the lower thoracic spine. No focal osseous lesions. IMPRESSION: 11.9 cm mass in the medial right upper and middle lobes, compatible with primary bronchogenic neoplasm. Mass occludes the right mainstem  bronchus, extends to the right perihilar region, and abuts/invades the mediastinum. Suspected lymphangitic spread of tumor in the right middle and lower lobes, less likely postobstructive opacity. Associated satellite nodularity in the right middle and lower lobes measuring up to 8 mm, suspicious for pulmonary metastases. Associated left supraclavicular and mediastinal nodal metastases. Moderate to large right pleural effusion with associated pleural-based nodularity, likely malignant. Two 10 mm hypoenhancing lesions in the liver, indeterminate. Electronically Signed   By: Julian Hy M.D.   On: 12/31/2015 15:27   Mr Jeri Cos [place] Contrast  01/14/2016  ADDENDUM REPORT: 01/14/2016 11:32 ADDENDUM: Study discussed by telephone with Dr. Curt Bears on 01/14/2016 At 1127 hours. Electronically Signed   By: Genevie Ann M.D.   On: 01/14/2016 11:32  01/14/2016  CLINICAL DATA:  60 year old male with recently diagnosed stage IV (T3, N3, M1b) non-small cell lung cancer. Staging. Subsequent encounter. EXAM: MRI HEAD WITHOUT AND WITH CONTRAST TECHNIQUE: Multiplanar, multiecho pulse sequences of the brain and surrounding structures were obtained without and with intravenous contrast. CONTRAST:  20 mL MultiHance. COMPARISON:  PET-CT 01/08/2016 FINDINGS: Post-contrast images are degraded by motion artifact despite repeated imaging attempts. There are multiple subcentimeter enhancing lesions in the bilateral cerebral hemispheres most compatible with brain metastases. There is a solitary 16 mm rim enhancing metastasis in the anterior right frontal lobe with surrounding cerebral edema (series 12, image 27 and series 8, image 15). There is also confluent cerebral edema in the left anterior frontal lobe related to a 9 mm metastasis there are (series 8, image 17 and series 12, image 31. There is a solitary punctate metastasis suspected in the posterior superior left cerebellum. In all about 14 small metastases are identified. No  dural thickening identified. However, there are also multiple small nonenhancing areas of restricted diffusion in both cerebral and cerebellar hemispheres (series 5). None of these however appears to be enhancing following contrast, such that these areas are most compatible with small scattered acute infarcts. The larger of these have T2 hyperintensity, including in the left splenium of the corpus callosum (series 7, image 16, seen to be nonenhancing on series 12, image 30). There is a superimposed small chronic left cerebellar lacunar infarct. No midline shift or loss of basilar cisterns. No acute intracranial hemorrhage identified. No ventriculomegaly. Major intracranial vascular flow voids are preserved. The left vertebral artery appears  dominant. Negative pituitary and cervicomedullary junction. Grossly negative visualized cervical spinal cord. Mildly to moderately heterogeneous bone marrow signal throughout the visible skull and upper cervical spine. Of these, there is associated abnormal diffusion at the left occipital condyle compatible with bone metastasis and this was hypermetabolic on PET-CT. A right side C2-C3 bone metastasis was better demonstrated by PET. Visible internal auditory structures appear normal. Mastoids are clear. Paranasal sinuses are clear. Negative orbit and scalp soft tissues. IMPRESSION: 1. Positive for multiple small brain metastases. Approximately fourteen enhancing metastases are identified ranging from punctate to 16 mm. There is associated cerebral edema, most pronounced in the anterior frontal lobes, without significant intracranial mass effect. 2. There also are numerous superimposed small nonenhancing areas of restricted diffusion in both cerebral and cerebellar hemispheres, but these are most compatible with small acute embolic infarcts. 3. Left occipital condyle skullbase metastasis. Cervical spine metastatic disease better demonstrated on recent PET-CT. Electronically Signed:  By: Genevie Ann M.D. On: 01/14/2016 11:22   Nm Pet Image Initial (pi) Skull Base To Thigh  01/08/2016  CLINICAL DATA:  Initial treatment strategy for lung mass. EXAM: NUCLEAR MEDICINE PET SKULL BASE TO THIGH TECHNIQUE: 12.86 mCi F-18 FDG was injected intravenously. Full-ring PET imaging was performed from the skull base to thigh after the radiotracer. CT data was obtained and used for attenuation correction and anatomic localization. FASTING BLOOD GLUCOSE:  Value: 126 mg/dl COMPARISON:  CT chest dated 12/31/2015 FINDINGS: NECK No hypermetabolic lymph nodes in the neck. Two dominant lesions in the left thyroid gland, mid representative max SUV 13.5, nonspecific. CHEST Right upper and middle lobe mass, max SUV 16.0, compatible with primary bronchogenic neoplasm. This was better evaluated on recent CT, when it measured approximately 11.9 x 8.0 x 11.4 cm. Associated near complete collapse of the right lung secondary to a large right pleural effusion, malignant, with multiple enhancing pleural-based nodules, representative max SUV 16.2. Left lung is grossly clear.  No pneumothorax. Widespread thoracic lymphadenopathy, max SUV 16.6 in the subcarinal region. 9 mm short axis left supraclavicular nodes are present with max SUV 9.1. The heart is normal in size.  Trace pericardial fluid. ABDOMEN/PELVIS Motion degraded images. Three hepatic metastases, including a 2.1 cm lesion in segment 7 with max SUV 20.1 and a 1.5 cm lesion in segment 4A with max SUV 14.7. No abnormal hypermetabolic activity within the pancreas, adrenal glands, or spleen. No hypermetabolic lymph nodes in the abdomen or pelvis. SKELETON Innumerable intramuscular and subcutaneous soft tissue metastases. For example, representative max SUV 17.2 along the posterior left shoulder and representative max SUV 15.2 in the left gluteal region. Multifocal osseous metastases in the visualized axial and appendicular skeleton. For example: --Left clivus/skull base, max SUV  18.0 --Lower cervical spine, max SUV 6.0 --Left acromion, max SUV 8.2 --Lower thoracic spine, max SUV 5.4 --Left proximal femur, max SUV 15.0 IMPRESSION: Stage IV right lung cancer, with widespread nodal, pleural, hepatic, soft tissue, and osseous metastases, as above. Electronically Signed   By: Julian Hy M.D.   On: 01/08/2016 09:53   US Thoracentesis Asp Pleural Space W/img Guide  01/10/2016  INDICATION: Patient with recent bronchoscopy revealing atypical cells, dyspnea, recurrent right pleural effusion. Request made for diagnostic and therapeutic right thoracentesis. EXAM: ULTRASOUND GUIDED DIAGNOSTIC AND THERAPEUTIC RIGHT THORACENTESIS MEDICATIONS: None. COMPLICATIONS: None immediate. PROCEDURE: An ultrasound guided thoracentesis was thoroughly discussed with the patient and questions answered. The benefits, risks, alternatives and complications were also discussed. The patient understands and wishes to proceed with  the procedure. Written consent was obtained. Ultrasound was performed to localize and mark an adequate pocket of fluid in the right chest. The area was then prepped and draped in the normal sterile fashion. 1% Lidocaine was used for local anesthesia. Under ultrasound guidance a Safe-T-Centesis catheter was introduced. Thoracentesis was performed. The catheter was removed and a dressing applied. FINDINGS: A total of approximately 2.3 liters of turbid, amber fluid was removed. Samples were sent to the laboratory as requested by the clinical team. Only the above amount of fluid was removed at this time secondary to persistent patient coughing . IMPRESSION: Successful ultrasound guided diagnostic and therapeutic right thoracentesis yielding 2.3 liters of pleural fluid. Read by: Rowe Robert, PA-C Electronically Signed   By: Aletta Edouard M.D.   On: 01/10/2016 12:57       IMPRESSION: Mr. Camille is a 60 yo male with Stage IV T3, N3, M1b non-small cell adenocarcinoma of the lung with  metastatic disease to the brain. He is a good candidate for whole brain irradiation as well as external radiation to the lung.   PLAN: Dr. Lisbeth Renshaw discusses the findings from the patients imagining studies and pathology studies. He does not feel at this time that the patient is a candidate for SRS due to the multiple lesions seen on his MRI, and would recommend whole brain radiotherapy with consideration of SRS if necessary in the future. The risks, benefits, short and long terms effects have been reviewed for each of these forms of radiotherapy. He has his CT simulation appointment scheduled today for whole brain radiation and external radiation to the lung. The patient will continue further discussion regarding his systemic therapy with Dr. Julien Nordmann, and is currently awaiting molecular studies to determine his course of treatment.  He does have a significant family history for pancreatic and ovarian cancer. He is interested in meeting with genetics as these siblings are elderly and would not be amenable themselves for testing.   The above documentation reflects my direct findings during this shared patient visit. Please see the separate note by Dr. Lisbeth Renshaw on this date for the remainder of the patient's plan of care.    Carola Rhine, PAC    This document serves as a record of services personally performed by Kyung Rudd, MD and Shona Simpson, PAC. It was created on their behalf by  Lendon Collar, a trained medical scribe. The creation of this record is based on the scribe's personal observations and the provider's statements to them. This document has been checked and approved by the attending provider.

## 2016-01-18 NOTE — Telephone Encounter (Signed)
Gave pt apt & avs °

## 2016-01-18 NOTE — Telephone Encounter (Signed)
Gave and printed appt shced and avs for pt for June

## 2016-01-19 ENCOUNTER — Ambulatory Visit
Admission: RE | Admit: 2016-01-19 | Payer: PRIVATE HEALTH INSURANCE | Source: Ambulatory Visit | Admitting: Radiation Oncology

## 2016-01-19 ENCOUNTER — Other Ambulatory Visit: Payer: Self-pay

## 2016-01-19 ENCOUNTER — Other Ambulatory Visit (HOSPITAL_COMMUNITY): Payer: Self-pay | Admitting: Interventional Radiology

## 2016-01-19 ENCOUNTER — Encounter: Payer: Self-pay | Admitting: Cardiothoracic Surgery

## 2016-01-19 ENCOUNTER — Encounter: Payer: Self-pay | Admitting: Radiation Oncology

## 2016-01-19 ENCOUNTER — Other Ambulatory Visit: Payer: Self-pay | Admitting: *Deleted

## 2016-01-19 ENCOUNTER — Ambulatory Visit
Admission: RE | Admit: 2016-01-19 | Discharge: 2016-01-19 | Disposition: A | Discharge status: Home / Self Care | Payer: PRIVATE HEALTH INSURANCE | Source: Ambulatory Visit | Attending: Cardiothoracic Surgery | Admitting: Cardiothoracic Surgery

## 2016-01-19 ENCOUNTER — Institutional Professional Consult (permissible substitution) (INDEPENDENT_AMBULATORY_CARE_PROVIDER_SITE_OTHER): Payer: PRIVATE HEALTH INSURANCE | Admitting: Cardiothoracic Surgery

## 2016-01-19 ENCOUNTER — Encounter: Payer: PRIVATE HEALTH INSURANCE | Admitting: Cardiothoracic Surgery

## 2016-01-19 ENCOUNTER — Ambulatory Visit: Payer: PRIVATE HEALTH INSURANCE | Admitting: Radiation Oncology

## 2016-01-19 VITALS — BP 120/69 | HR 86 | Resp 16 | Ht 70.0 in | Wt 253.0 lb

## 2016-01-19 DIAGNOSIS — J90 Pleural effusion, not elsewhere classified: Secondary | ICD-10-CM

## 2016-01-19 DIAGNOSIS — J910 Malignant pleural effusion: Secondary | ICD-10-CM | POA: Diagnosis not present

## 2016-01-19 DIAGNOSIS — J948 Other specified pleural conditions: Secondary | ICD-10-CM | POA: Diagnosis not present

## 2016-01-19 NOTE — Progress Notes (Signed)
[address]       [place] [address]             [id number]                    Kylan M [place] Medical Record #[id number] Date of Birth: 1956-02-25  Referring: Tanda Rockers, MD Primary Care: [name], MD  Chief Complaint:    Chief Complaint  Patient presents with  . Pleural Effusion    malignant on rthe right, has had thoracentesis with path,,,CT CHEST 4/27/1/7, PET 01/08/16    History of Present Illness:    ARMON ORVIS 60 y.o. male is seen in the office  today for For large right lung mass with moderate pleural effusion previously drained on 2 occasions in early May. 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 Over the past 4 weeks his overall health has markedly deteriorated. He is now on home oxygen and recently had diagnosis of stage IV lung cancer with brain metastasis made he's to start brain radiation tomorrow. In early May he presented with increasing shortness of breath and cough and was found to have a large right lung mass with a right pleural effusion, on 2 occasions 2 days apart he had thoracentesis done with drainage of over 2 L. He is now on home oxygen continuously. The past is good diagnosed with atrial fibrillation and for a period of time was anticoagulated in August 2016 but currently is not on any. The patient comes to the office today to discuss possible placement of Pleurx catheter      Current Activity/ Functional Status:  Patient is not independent with mobility/ambulation, transfers, ADL's, IADL's.   Zubrod Score: At the time of surgery this patient's most appropriate activity status/level should be described as: '[]'$     0    Normal activity, no symptoms '[]'$     1    Restricted in physical strenuous activity but ambulatory, able to do out light work '[]'$     2    Ambulatory and capable of self care, unable to do work activities, up and about                >50 % of waking hours                              '[x]'$     3    Only limited self care, in bed greater than 50% of waking hours '[]'$     4    Completely disabled, no self care, confined to bed or chair '[]'$     5    Moribund   Past Medical History  Diagnosis Date  . HTN (hypertension)     pt unaware of this  . Persistent atrial fibrillation ([facility])     now SR after DCCV   . Obesity   . H/O hiatal hernia   . Moderate mitral regurgitation   . Hyperthyroidism     treated with tapazole  . Brain cancer ([facility]) 01/14/16    multiple brain metastases  . Non-small cell cancer of right lung ([facility]) 01/11/2016  . Brain metastasis ([facility]) 01/18/2016    Past Surgical History  Procedure Laterality Date  . Lower back surgery      herniated nucleus pulposus left L5-S1; semihemilaminectomy and diskectomy with microdissection with  microscope  . Right cornea repair      secondary to injury from a bungee cord  . Tonsillectomy    . Cardioversion N/A 05/08/2013    unsuccessful for persistent afib  . Tee without cardioversion N/A 06/24/2014    Procedure: TRANSESOPHAGEAL ECHOCARDIOGRAM (TEE);  Surgeon: Josue Hector, MD;  Location: [facility];  Service: Cardiovascular;  Laterality: N/A;  . Cardioversion N/A 04/09/2015    Procedure: CARDIOVERSION;  Surgeon: Josue Hector, MD;  Location[facility] ENDOSCOPY;  Service: Cardiovascular;  Laterality: N/A;  . Video bronchoscopy Bilateral 01/01/2016    Procedure: VIDEO BRONCHOSCOPY WITH FLUORO;  Surgeon: Tanda Rockers, MD;  Location: [facility] ENDOSCOPY;  Service: Cardiopulmonary;  Laterality: Bilateral;  . Thoracentesis Right 01/10/16& 01/13/16    01/09/14= x1 liter , 01/13/16=x 2 liters    Family History  Problem Relation Age of Onset  . Diabetes Brother   . Hyperlipidemia Mother   . Pancreatic cancer Father 41  . Thyroid disease Mother   . Lung cancer Sister   . Ovarian cancer Sister     half sister    Social History   Social History  . Marital Status: Married     Spouse Name: N/A  . Number of Children: N/A  . 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 Smoking status: Former Smoker -- 1.50 packs/day for 27 years    Types: Cigarettes    Quit date: 09/05/2000  . Smokeless tobacco: Former Systems developer    Quit date: 05/08/2002  . Alcohol Use: No  . Drug Use: No  . Sexual Activity: Not Currently   Other Topics Concern  . Not on file   Social History Narrative   Pt lives in [place] with spouse.  Truck driver    History  Smoking status  . Former Smoker -- 1.50 packs/day for 27 years  . Types: Cigarettes  . Quit date: 09/05/2000  Smokeless tobacco  . Former Systems developer  . Quit date: 05/08/2002    History  Alcohol Use No     No Known Allergies  Current Outpatient Prescriptions  Medication Sig Dispense Refill  . acetaminophen-codeine (TYLENOL #3) 300-30 MG tablet One every 4 hours as needed for cough 100 tablet 0  . ALPRAZolam (XANAX) 0.25 MG tablet Take 1 tablet (0.25 mg total) by mouth at bedtime as needed for anxiety. 30 tablet 0  . budesonide-formoterol (SYMBICORT) 80-4.5 MCG/ACT inhaler Take 2 puffs first thing in am and then another 2 puffs about 12 hours later.    Marland Kitchen dexamethasone (DECADRON) 4 MG tablet Take 1 tablet (4 mg total) by mouth 3 (three) times daily. 40 tablet 0  . methimazole (TAPAZOLE) 5 MG tablet Take 1 tablet (5 mg total) by mouth daily. 90 tablet 0  . metoprolol (LOPRESSOR) 50 MG tablet Take 0.5 tablets (25 mg total) by mouth 2 (two) times daily. 180 tablet 2  . omeprazole (PRILOSEC) 40 MG capsule Take 1 capsule (40 mg total) by mouth daily. 90 capsule 1  . OXYGEN Reported on 01/18/2016    . promethazine-codeine (PHENERGAN WITH CODEINE) 6.25-10 MG/5ML syrup Reported on 01/19/2016  0   No current facility-administered medications for this visit.      Review of Systems:     Cardiac Review of Systems: Y or N  Chest Pain [ y  ]  Resting SOB Blue.Reese   ] Exertional SOB  [ y ]    Vertell Limber Blue.Reese  ]  Pedal Edema Florencio.Farrier   ]    Palpitations [ n ] Syncope  [n  ]   Presyncope [ n  ]  General Review of Systems: [Y] = yes [  ]=no Constitional: recent weight change Blue.Reese  ];  Wt loss over the last 3 months [   ] anorexia [ y ]; fatigue [ y ]; nausea [  ]; night sweats [  ]; fever [  ]; or chills [  ];          Dental: poor dentition[  ]; Last Dentist visit:   Eye : blurred vision [  ]; diplopia [   ]; vision changes [  ];  Amaurosis fugax[  ]; Resp: cough [  ];  wheezing[  y];  hemoptysis[ n ]; shortness of breath[y  ]; paroxysmal nocturnal dyspnea[y  ]; dyspnea on exertion[y  ]; or orthopnea[ y];  GI:  gallstones[  ], vomiting[  ];  dysphagia[  ]; melena[  ];  hematochezia [  ]; heartburn[  ];   Hx of  Colonoscopy[  ]; GU: kidney stones [  ]; hematuria[  ];   dysuria [  ];  nocturia[  ];  history of     obstruction [  ]; urinary frequency [  ]             Skin: rash, swelling[  ];, hair loss[  ];  peripheral edema[  ];  or itching[  ]; Musculosketetal: myalgias[  ];  joint swelling[  ];  joint erythema[  ];  joint pain[  ];  back pain[  ];  Heme/Lymph: bruising[  ];  bleeding[  ];  anemia[  ];  Neuro: TIA[  ];  headaches[  ];  stroke[  ];  vertigo[  ];  seizures[n  ];   paresthesias[  ];  difficulty walking[  ];  Psych:depression[  ]; anxiety[  ];  Endocrine: diabetes[n  ];  thyroid dysfunction[ y ];  Immunizations: Flu up to date Blue.Reese  ]; Pneumococcal up to date n[  ];  Other:  Physical Exam: BP 120/69 mmHg  Pulse 86  Resp 16  Ht '5\' 10"'$  (1.778 m)  Wt 114.76 kg (253 lb)  BMI 36.30 kg/m2  SpO2 93%  PHYSICAL EXAMINATION: Patient on oxygen. His fatigue General appearance: alert and cooperative Head: Normocephalic, without obvious abnormality, atraumatic Neck: no adenopathy, no carotid bruit, no JVD, supple, symmetrical, trachea midline and thyroid not enlarged, symmetric, no tenderness/mass/nodules Lymph nodes: Cervical, supraclavicular, and axillary nodes normal. Resp: Decreased  breath sounds over the right base  Back: symmetric, no curvature. ROM normal. No CVA tenderness. Cardio: regular rate and rhythm, S1, S2 normal, no murmur, click, rub or gallop GI: soft, non-tender; bowel sounds normal; no masses,  no organomegaly Extremities: extremities normal, atraumatic, no cyanosis  and Homans sign is negative, no sign of DVT but significant bilateral pedal edema Neurologic: Grossly normal  Diagnostic Studies & Laboratory data:     Recent Radiology Findings:  Ct Chest W Contrast  12/31/2015  CLINICAL DATA:  Cough x6 weeks, abnormal chest radiograph EXAM: CT CHEST WITH CONTRAST TECHNIQUE: Multidetector CT imaging of the chest was performed during intravenous contrast administration. CONTRAST:  64m ISOVUE-300 IOPAMIDOL (ISOVUE-300) INJECTION 61% COMPARISON:  Chest radiographs dated 12/28/2015 FINDINGS: Mediastinum/Nodes: The heart is normal in size. No pericardial effusion. Coronary atherosclerosis in the LAD. Atherosclerotic calcifications of the aortic arch. Thoracic lymphadenopathy, including: --8 mm short axis left supraclavicular node (series 2/ image 5) --2.8 cm short  axis high right paratracheal node (series 2/ image 46) --10 mm short axis AP window node (series 2/image 54) --3.5 cm short axis low right paratracheal node (series 2/ image 56) --1.8 cm short axis subcarinal node (series 2/ image 67) Lungs/Pleura: 11.9 x 8.0 x 11.4 cm mass in the medial right upper and middle lobes (series 2/image 47), compatible with primary bronchogenic neoplasm. Mass abuts the medial pleural surface, occludes the right mainstem bronchus (series 2/image 60), and directly extends into the right perihilar region and mediastinum. Secondary ground-glass opacity with increased interstitial markings in the right middle lobe and right lower lobe (series 5/image 81), likely reflecting lymphangitic spread of tumor, less likely postobstructive opacity. Additional satellite nodularity in the right middle  and lower lobes (for example, series 5/images 70, 83, 91, 92, and 105), measuring up to 8 mm, suspicious for metastases. Moderate to large right pleural effusion. Pleural-based nodularity (series 2/ image 121), suggesting malignant effusion. Associated compressive atelectasis in the right lower lobe. Left lung is notable for mild paraseptal emphysematous changes. No pneumothorax. Upper abdomen: Visualized upper abdomen is notable for a 10 mm hypoenhancing lesion in the right hepatic dome (series 2/image 115), indeterminate. Additional 10 mm hypoenhancing lesion in the central left hepatic lobe (series 2/ image 139). No evidence of adrenal metastases. Small upper abdominal lymph nodes measuring up to 9 mm short axis (series 2/ image 156), within normal limits. Musculoskeletal: Mild degenerative changes of the lower thoracic spine. No focal osseous lesions. IMPRESSION: 11.9 cm mass in the medial right upper and middle lobes, compatible with primary bronchogenic neoplasm. Mass occludes the right mainstem bronchus, extends to the right perihilar region, and abuts/invades the mediastinum. Suspected lymphangitic spread of tumor in the right middle and lower lobes, less likely postobstructive opacity. Associated satellite nodularity in the right middle and lower lobes measuring up to 8 mm, suspicious for pulmonary metastases. Associated left supraclavicular and mediastinal nodal metastases. Moderate to large right pleural effusion with associated pleural-based nodularity, likely malignant. Two 10 mm hypoenhancing lesions in the liver, indeterminate. Electronically Signed   By: Julian Hy M.D.   On: 12/31/2015 15:27   Mr Jeri Cos WE Contrast  01/14/2016  ADDENDUM REPORT: 01/14/2016 11:32 ADDENDUM: Study discussed by telephone with Dr. Curt Bears on 01/14/2016 At 1127 hours. Electronically Signed   By: Genevie Ann M.D.   On: 01/14/2016 11:32  01/14/2016  CLINICAL DATA:  60 year old male with recently diagnosed  stage IV (T3, N3, M1b) non-small cell lung cancer. Staging. Subsequent encounter. EXAM: MRI HEAD WITHOUT AND WITH CONTRAST TECHNIQUE: Multiplanar, multiecho pulse sequences of the brain and surrounding structures were obtained without and with intravenous contrast. CONTRAST:  20 mL MultiHance. COMPARISON:  PET-CT 01/08/2016 FINDINGS: Post-contrast images are degraded by motion artifact despite repeated imaging attempts. There are multiple subcentimeter enhancing lesions in the bilateral cerebral hemispheres most compatible with brain metastases. There is a solitary 16 mm rim enhancing metastasis in the anterior right frontal lobe with surrounding cerebral edema (series 12, image 27 and series 8, image 15). There is also confluent cerebral edema in the left anterior frontal lobe related to a 9 mm metastasis there are (series 8, image 17 and series 12, image 31. There is a solitary punctate metastasis suspected in the posterior superior left cerebellum. In all about 14 small metastases are identified. No dural thickening identified. However, there are also multiple small nonenhancing areas of restricted diffusion in both cerebral and cerebellar hemispheres (series 5). None of these however appears to  be enhancing following contrast, such that these areas are most compatible with small scattered acute infarcts. The larger of these have T2 hyperintensity, including in the left splenium of the corpus callosum (series 7, image 16, seen to be nonenhancing on series 12, image 30). There is a superimposed small chronic left cerebellar lacunar infarct. No midline shift or loss of basilar cisterns. No acute intracranial hemorrhage identified. No ventriculomegaly. Major intracranial vascular flow voids are preserved. The left vertebral artery appears dominant. Negative pituitary and cervicomedullary junction. Grossly negative visualized cervical spinal cord. Mildly to moderately heterogeneous bone marrow signal throughout the  visible skull and upper cervical spine. Of these, there is associated abnormal diffusion at the left occipital condyle compatible with bone metastasis and this was hypermetabolic on PET-CT. A right side C2-C3 bone metastasis was better demonstrated by PET. Visible internal auditory structures appear normal. Mastoids are clear. Paranasal sinuses are clear. Negative orbit and scalp soft tissues. IMPRESSION: 1. Positive for multiple small brain metastases. Approximately fourteen enhancing metastases are identified ranging from punctate to 16 mm. There is associated cerebral edema, most pronounced in the anterior frontal lobes, without significant intracranial mass effect. 2. There also are numerous superimposed small nonenhancing areas of restricted diffusion in both cerebral and cerebellar hemispheres, but these are most compatible with small acute embolic infarcts. 3. Left occipital condyle skullbase metastasis. Cervical spine metastatic disease better demonstrated on recent PET-CT. Electronically Signed: By: Genevie Ann M.D. On: 01/14/2016 11:22   Nm Pet Image Initial (pi) Skull Base To Thigh  01/08/2016  CLINICAL DATA:  Initial treatment strategy for lung mass. EXAM: NUCLEAR MEDICINE PET SKULL BASE TO THIGH TECHNIQUE: 12.86 mCi F-18 FDG was injected intravenously. Full-ring PET imaging was performed from the skull base to thigh after the radiotracer. CT data was obtained and used for attenuation correction and anatomic localization. FASTING BLOOD GLUCOSE:  Value: 126 mg/dl COMPARISON:  CT chest dated 12/31/2015 FINDINGS: NECK No hypermetabolic lymph nodes in the neck. Two dominant lesions in the left thyroid gland, mid representative max SUV 13.5, nonspecific. CHEST Right upper and middle lobe mass, max SUV 16.0, compatible with primary bronchogenic neoplasm. This was better evaluated on recent CT, when it measured approximately 11.9 x 8.0 x 11.4 cm. Associated near complete collapse of the right lung secondary to a  large right pleural effusion, malignant, with multiple enhancing pleural-based nodules, representative max SUV 16.2. Left lung is grossly clear.  No pneumothorax. Widespread thoracic lymphadenopathy, max SUV 16.6 in the subcarinal region. 9 mm short axis left supraclavicular nodes are present with max SUV 9.1. The heart is normal in size.  Trace pericardial fluid. ABDOMEN/PELVIS Motion degraded images. Three hepatic metastases, including a 2.1 cm lesion in segment 7 with max SUV 20.1 and a 1.5 cm lesion in segment 4A with max SUV 14.7. No abnormal hypermetabolic activity within the pancreas, adrenal glands, or spleen. No hypermetabolic lymph nodes in the abdomen or pelvis. SKELETON Innumerable intramuscular and subcutaneous soft tissue metastases. For example, representative max SUV 17.2 along the posterior left shoulder and representative max SUV 15.2 in the left gluteal region. Multifocal osseous metastases in the visualized axial and appendicular skeleton. For example: --Left clivus/skull base, max SUV 18.0 --Lower cervical spine, max SUV 6.0 --Left acromion, max SUV 8.2 --Lower thoracic spine, max SUV 5.4 --Left proximal femur, max SUV 15.0 IMPRESSION: Stage IV right lung cancer, with widespread nodal, pleural, hepatic, soft tissue, and osseous metastases, as above. Electronically Signed   By: Henderson Newcomer.D.  On: 01/08/2016 09:53     I have independently reviewed the above radiologic studies.  Recent Lab Findings: Lab Results  Component Value Date   WBC 31.8* 01/18/2016   HGB 14.2 01/18/2016   HCT 44.2 01/18/2016   PLT 111* 01/18/2016   GLUCOSE 228* 01/18/2016   CHOL 126 02/13/2012   TRIG 111.0 02/13/2012   HDL 32.70* 02/13/2012   LDLCALC 71 02/13/2012   ALT 64* 01/18/2016   AST 19 01/18/2016   NA 137 01/18/2016   K 5.1 01/18/2016   CL 102 01/11/2016   CREATININE 0.8 01/18/2016   BUN 23.4 01/18/2016   CO2 27 01/18/2016   TSH 2.39 12/28/2015   INR 1.34 01/10/2016       Assessment / Plan:   Stage IV (T3, N3, M1b) non-small cell lung cancer, adenocarcinoma, pending further diagnostic studies diagnosed in April 2017 and presented with large right upper and middle lobe lung mass in addition to extensive mediastinal and supraclavicular lymphadenopathy as well as liver, bone, brain and soft tissue lesions. Molecular studies are still pending    Has had significant right pleural effusion, tapped twice  I discussed with the patient his wife and 2 daughters the natural history of malignant effusions and have recommended to the patient that in the near future that we've proceeded with placement of right Pleurx catheter to be able to control his right effusions without necessitating recurrent visits to the emergency room. Risks and options were discussed with them in detail and he is willing to proceed with Pleurx catheter placement on the right Patient starts brain irradiation tomorrow we'll adjust our schedule to accommodate the patient on Friday if we can work around his radiation time  I  spent 30 minutes counseling the patient face to face and 50% or more the  time was spent in counseling and coordination of care. The total time spent in the appointment was 40 minutes.  Grace Isaac MD      [address] [place] [address] Office [phone]   Beeper [phone]  01/19/2016 5:52 PM

## 2016-01-19 NOTE — Progress Notes (Signed)
This encounter was created in error - please disregard.  This encounter was created in error - please disregard.

## 2016-01-20 ENCOUNTER — Ambulatory Visit
Admission: RE | Admit: 2016-01-20 | Discharge: 2016-01-20 | Disposition: A | Discharge status: Home / Self Care | Payer: PRIVATE HEALTH INSURANCE | Source: Ambulatory Visit | Attending: Radiation Oncology | Admitting: Radiation Oncology

## 2016-01-20 ENCOUNTER — Other Ambulatory Visit: Payer: PRIVATE HEALTH INSURANCE

## 2016-01-20 ENCOUNTER — Encounter (HOSPITAL_COMMUNITY): Payer: Self-pay | Admitting: *Deleted

## 2016-01-20 ENCOUNTER — Telehealth: Payer: Self-pay | Admitting: *Deleted

## 2016-01-20 DIAGNOSIS — Z510 Encounter for antineoplastic radiation therapy: Secondary | ICD-10-CM | POA: Diagnosis not present

## 2016-01-20 NOTE — Progress Notes (Signed)
Anesthesia Chart Review:  Pt is a 60 year old male scheduled for insertion R pleural drainage catheter on 01/22/2016 with Dr. Servando Snare.   Pt is a same day work up.   Cardiologist is Dr. Jenkins Rouge, last visit 07/10/15.   PMH includes:  Atrial fibrillation (s/p DCCV 04/09/15), moderate mitral regurgitation (by echo and TEE 06/2014), HTN, hypertension, non-small cell lung cancer with brain metastasis, uses 2L oxygen. Former smoker. BMI 36  Medications include: symbicort, dexamethasone, methimazole, metorprolol, prilosec.   Labs will be obtained DOS.   Chest x-ray 01/19/16 reviewed. Stable large right upper lobe mass and moderate size right pleural effusion.  EKG 01/09/16: sinus rhythm  Carotid duplex 08/28/15: 1-39% bilateral ICA stenosis.  TEE 06/24/14:  - Left ventricle: Systolic function was normal. The estimated ejection fraction was in the range of 50% to 55%. - Mitral valve: mildly dilated annulus moderate regurgitation. There was moderate regurgitation. - Left atrium: The atrium was moderately dilated. - Right atrium: No evidence of thrombus in the atrial cavity or appendage. - Atrial septum: No defect or patent foramen ovale was identified. - Tricuspid valve: There was mild-moderate regurgitation.  If labs acceptable DOS, I anticipate pt can proceed with surgery as scheduled.   Willeen Cass, FNP-BC [facility]/Anesthesiology Phone: [phone] 01/20/2016 1:31 PM

## 2016-01-20 NOTE — Telephone Encounter (Signed)
Called patient to inform of genetics appt. With [name] on 02-23-16- arrival time - 12:30 pm, spoke with patient's wife - Butch Penny and she is aware of this appt.

## 2016-01-20 NOTE — Progress Notes (Signed)
Pt has history of A-fib. Cardiologist is Dr. Johnsie Cancel, last OV was in Sept, 2016. Spoke with pt's wife, Butch Penny for pre-op call, pt was in same room listening. She states that pt has not had any recent chest pain, but has very bad sob due to pleural effusion on right side. Pt is on 2 L of O2 at all times.

## 2016-01-21 ENCOUNTER — Encounter: Payer: Self-pay | Admitting: Internal Medicine

## 2016-01-21 ENCOUNTER — Ambulatory Visit
Admission: RE | Admit: 2016-01-21 | Discharge: 2016-01-21 | Disposition: A | Discharge status: Home / Self Care | Payer: PRIVATE HEALTH INSURANCE | Source: Ambulatory Visit | Attending: Radiation Oncology | Admitting: Radiation Oncology

## 2016-01-21 DIAGNOSIS — Z510 Encounter for antineoplastic radiation therapy: Secondary | ICD-10-CM | POA: Diagnosis not present

## 2016-01-21 MED ORDER — DEXTROSE 5 % IV SOLN
1.5000 g | INTRAVENOUS | Status: AC
  Administered 2016-01-22: 1.5 g via INTRAVENOUS
  Filled 2016-01-21: qty 1.5

## 2016-01-21 NOTE — Progress Notes (Signed)
left in box- disab form- left for dr. Mckinley Jewel to sign

## 2016-01-21 NOTE — Progress Notes (Signed)
left in box- disab form

## 2016-01-22 ENCOUNTER — Ambulatory Visit (HOSPITAL_COMMUNITY): Payer: PRIVATE HEALTH INSURANCE

## 2016-01-22 ENCOUNTER — Ambulatory Visit
Admission: RE | Admit: 2016-01-22 | Discharge: 2016-01-22 | Disposition: A | Discharge status: Home / Self Care | Payer: PRIVATE HEALTH INSURANCE | Source: Ambulatory Visit | Attending: Radiation Oncology | Admitting: Radiation Oncology

## 2016-01-22 ENCOUNTER — Other Ambulatory Visit: Payer: Self-pay | Admitting: *Deleted

## 2016-01-22 ENCOUNTER — Encounter: Payer: Self-pay | Admitting: Internal Medicine

## 2016-01-22 ENCOUNTER — Ambulatory Visit (HOSPITAL_COMMUNITY)
Admission: RE | Admit: 2016-01-22 | Discharge: 2016-01-22 | Disposition: A | Discharge status: Home / Self Care | Payer: PRIVATE HEALTH INSURANCE | Source: Ambulatory Visit | Attending: Cardiothoracic Surgery | Admitting: Cardiothoracic Surgery

## 2016-01-22 ENCOUNTER — Encounter: Payer: Self-pay | Admitting: Radiation Oncology

## 2016-01-22 ENCOUNTER — Encounter (HOSPITAL_COMMUNITY): Payer: Self-pay | Admitting: Certified Registered"

## 2016-01-22 ENCOUNTER — Ambulatory Visit (HOSPITAL_COMMUNITY): Payer: PRIVATE HEALTH INSURANCE | Admitting: Emergency Medicine

## 2016-01-22 ENCOUNTER — Encounter (HOSPITAL_COMMUNITY)
Admission: RE | Disposition: A | Discharge status: Home / Self Care | Payer: Self-pay | Source: Ambulatory Visit | Attending: Cardiothoracic Surgery

## 2016-01-22 VITALS — BP 118/61 | HR 83 | Temp 97.8°F | Resp 20 | Wt 247.9 lb

## 2016-01-22 DIAGNOSIS — Z9981 Dependence on supplemental oxygen: Secondary | ICD-10-CM | POA: Insufficient documentation

## 2016-01-22 DIAGNOSIS — Z01818 Encounter for other preprocedural examination: Secondary | ICD-10-CM

## 2016-01-22 DIAGNOSIS — Z6835 Body mass index (BMI) 35.0-35.9, adult: Secondary | ICD-10-CM | POA: Diagnosis not present

## 2016-01-22 DIAGNOSIS — Z87891 Personal history of nicotine dependence: Secondary | ICD-10-CM | POA: Diagnosis not present

## 2016-01-22 DIAGNOSIS — C7931 Secondary malignant neoplasm of brain: Secondary | ICD-10-CM | POA: Diagnosis not present

## 2016-01-22 DIAGNOSIS — E0590 Thyrotoxicosis, unspecified without thyrotoxic crisis or storm: Secondary | ICD-10-CM | POA: Diagnosis not present

## 2016-01-22 DIAGNOSIS — J910 Malignant pleural effusion: Secondary | ICD-10-CM | POA: Insufficient documentation

## 2016-01-22 DIAGNOSIS — C3491 Malignant neoplasm of unspecified part of right bronchus or lung: Secondary | ICD-10-CM | POA: Diagnosis not present

## 2016-01-22 DIAGNOSIS — J90 Pleural effusion, not elsewhere classified: Secondary | ICD-10-CM | POA: Diagnosis not present

## 2016-01-22 DIAGNOSIS — Z510 Encounter for antineoplastic radiation therapy: Secondary | ICD-10-CM | POA: Diagnosis not present

## 2016-01-22 DIAGNOSIS — G8918 Other acute postprocedural pain: Secondary | ICD-10-CM

## 2016-01-22 DIAGNOSIS — Z9889 Other specified postprocedural states: Secondary | ICD-10-CM

## 2016-01-22 HISTORY — DX: Reserved for inherently not codable concepts without codable children: IMO0001

## 2016-01-22 HISTORY — PX: CHEST TUBE INSERTION: SHX231

## 2016-01-22 HISTORY — DX: Anxiety disorder, unspecified: F41.9

## 2016-01-22 LAB — CBC
HCT: 48 % (ref 39.0–52.0)
HCT: 47.6 % (ref 39.0–52.0)
Hemoglobin: 15.4 g/dL (ref 13.0–17.0)
Hemoglobin: 14.9 g/dL (ref 13.0–17.0)
MCH: 28.5 pg (ref 26.0–34.0)
MCH: 28.4 pg (ref 26.0–34.0)
MCHC: 32.1 g/dL (ref 30.0–36.0)
MCHC: 31.3 g/dL (ref 30.0–36.0)
MCV: 88.9 fL (ref 78.0–100.0)
MCV: 90.7 fL (ref 78.0–100.0)
Platelets: 103 10*3/uL — ABNORMAL LOW (ref 150–400)
Platelets: 61 10*3/uL — ABNORMAL LOW (ref 150–400)
RBC: 5.4 MIL/uL (ref 4.22–5.81)
RBC: 5.25 MIL/uL (ref 4.22–5.81)
RDW: 15.2 % (ref 11.5–15.5)
RDW: 14.8 % (ref 11.5–15.5)
WBC: 47.4 10*3/uL — ABNORMAL HIGH (ref 4.0–10.5)
WBC: 40.5 10*3/uL — ABNORMAL HIGH (ref 4.0–10.5)

## 2016-01-22 LAB — SURGICAL PCR SCREEN
MRSA, PCR: NEGATIVE
Staphylococcus aureus: NEGATIVE

## 2016-01-22 LAB — EPIDERMAL GROWTH FACTOR RECEPTOR (EGFR) MUTATION ANALYSIS

## 2016-01-22 LAB — BASIC METABOLIC PANEL
Anion gap: 11 (ref 5–15)
BUN: 24 mg/dL — ABNORMAL HIGH (ref 6–20)
CO2: 25 mmol/L (ref 22–32)
Calcium: 8.2 mg/dL — ABNORMAL LOW (ref 8.9–10.3)
Chloride: 101 mmol/L (ref 101–111)
Creatinine, Ser: 0.89 mg/dL (ref 0.61–1.24)
GFR calc Af Amer: >60 mL/min (ref >60)
GFR calc non Af Amer: >60 mL/min (ref >60)
Glucose, Bld: 154 mg/dL — ABNORMAL HIGH (ref 65–99)
Potassium: 4.8 mmol/L (ref 3.5–5.1)
Sodium: 137 mmol/L (ref 135–145)

## 2016-01-22 LAB — APTT: aPTT: 24 seconds (ref 24–37)

## 2016-01-22 LAB — PROTIME-INR
INR: 1.42 (ref 0.00–1.49)
Prothrombin Time: 17.4 seconds — ABNORMAL HIGH (ref 11.6–15.2)

## 2016-01-22 SURGERY — INSERTION, PLEURAL DRAINAGE CATHETER
Anesthesia: Monitor Anesthesia Care | Laterality: Right

## 2016-01-22 MED ORDER — 0.9 % SODIUM CHLORIDE (POUR BTL) OPTIME
TOPICAL | Status: DC | PRN
  Administered 2016-01-22: 1000 mL

## 2016-01-22 MED ORDER — FENTANYL CITRATE (PF) 250 MCG/5ML IJ SOLN
INTRAMUSCULAR | Status: AC
  Filled 2016-01-22: qty 5

## 2016-01-22 MED ORDER — LACTATED RINGERS IV SOLN
Freq: Once | INTRAVENOUS | Status: AC
  Administered 2016-01-22: 12:00:00 via INTRAVENOUS

## 2016-01-22 MED ORDER — FENTANYL CITRATE (PF) 250 MCG/5ML IJ SOLN
INTRAMUSCULAR | Status: DC | PRN
  Administered 2016-01-22 (×2): 25 ug via INTRAVENOUS
  Administered 2016-01-22: 50 ug via INTRAVENOUS

## 2016-01-22 MED ORDER — SONAFINE EX EMUL
1.0000 "application " | Freq: Two times a day (BID) | CUTANEOUS | Status: DC
  Administered 2016-01-22: 1 via TOPICAL
  Filled 2016-01-22: qty 45

## 2016-01-22 MED ORDER — DEXMEDETOMIDINE HCL IN NACL 200 MCG/50ML IV SOLN
INTRAVENOUS | Status: AC
  Filled 2016-01-22: qty 50

## 2016-01-22 MED ORDER — LIDOCAINE HCL (PF) 1 % IJ SOLN
INTRAMUSCULAR | Status: AC
  Filled 2016-01-22: qty 5

## 2016-01-22 MED ORDER — MIDAZOLAM HCL 2 MG/2ML IJ SOLN
INTRAMUSCULAR | Status: AC
  Filled 2016-01-22: qty 2

## 2016-01-22 MED ORDER — MUPIROCIN 2 % EX OINT
1.0000 "application " | TOPICAL_OINTMENT | Freq: Once | CUTANEOUS | Status: AC
  Administered 2016-01-22: 1 via TOPICAL
  Filled 2016-01-22: qty 22

## 2016-01-22 MED ORDER — DEXMEDETOMIDINE HCL IN NACL 200 MCG/50ML IV SOLN
INTRAVENOUS | Status: DC | PRN
  Administered 2016-01-22 (×5): 4 ug via INTRAVENOUS
  Administered 2016-01-22: 8 ug via INTRAVENOUS
  Administered 2016-01-22 (×6): 4 ug via INTRAVENOUS

## 2016-01-22 MED ORDER — LIDOCAINE HCL (PF) 1 % IJ SOLN
INTRAMUSCULAR | Status: AC
  Filled 2016-01-22: qty 30

## 2016-01-22 MED ORDER — LIDOCAINE HCL (PF) 1 % IJ SOLN
INTRAMUSCULAR | Status: DC | PRN
  Administered 2016-01-22: 5 mL
  Administered 2016-01-22: 15 mL

## 2016-01-22 MED ORDER — TRAMADOL HCL 50 MG PO TABS: 50.0000 mg | ORAL_TABLET | Freq: Four times a day (QID) | ORAL | Status: AC | PRN

## 2016-01-22 MED ORDER — MIDAZOLAM HCL 5 MG/5ML IJ SOLN
INTRAMUSCULAR | Status: DC | PRN
  Administered 2016-01-22 (×2): 1 mg via INTRAVENOUS

## 2016-01-22 SURGICAL SUPPLY — 26 items
BRUSH SCRUB EZ PLAIN DRY (MISCELLANEOUS) ×4 IMPLANT
CANISTER SUCTION 2500CC (MISCELLANEOUS) IMPLANT
COVER SURGICAL LIGHT HANDLE (MISCELLANEOUS) ×2 IMPLANT
COVER TRANSDUCER ULTRASND GEL (DRAPE) ×2 IMPLANT
DERMABOND ADVANCED (GAUZE/BANDAGES/DRESSINGS) ×1
DERMABOND ADVANCED .7 DNX12 (GAUZE/BANDAGES/DRESSINGS) ×1 IMPLANT
DRAPE C-ARM 42X72 X-RAY (DRAPES) ×2 IMPLANT
DRAPE LAPAROSCOPIC ABDOMINAL (DRAPES) ×2 IMPLANT
GAUZE SPONGE 4X4 12PLY STRL (GAUZE/BANDAGES/DRESSINGS) ×2 IMPLANT
GLOVE BIO SURGEON STRL SZ 6.5 (GLOVE) ×4 IMPLANT
GOWN STRL REUS W/ TWL LRG LVL3 (GOWN DISPOSABLE) ×2 IMPLANT
GOWN STRL REUS W/TWL LRG LVL3 (GOWN DISPOSABLE) ×2
KIT BASIN OR (CUSTOM PROCEDURE TRAY) ×2 IMPLANT
KIT PLEURX DRAIN CATH 1000ML (MISCELLANEOUS) ×4 IMPLANT
KIT PLEURX DRAIN CATH 15.5FR (DRAIN) ×2 IMPLANT
KIT ROOM TURNOVER OR (KITS) ×2 IMPLANT
NS IRRIG 1000ML POUR BTL (IV SOLUTION) ×2 IMPLANT
PACK GENERAL/GYN (CUSTOM PROCEDURE TRAY) ×2 IMPLANT
PAD ARMBOARD 7.5X6 YLW CONV (MISCELLANEOUS) ×4 IMPLANT
SET DRAINAGE LINE (MISCELLANEOUS) IMPLANT
SUT ETHILON 3 0 FSL (SUTURE) ×2 IMPLANT
SUT VIC AB 3-0 X1 27 (SUTURE) IMPLANT
TOWEL OR 17X24 6PK STRL BLUE (TOWEL DISPOSABLE) ×2 IMPLANT
TOWEL OR 17X26 10 PK STRL BLUE (TOWEL DISPOSABLE) ×2 IMPLANT
VALVE REPLACEMENT CAP (MISCELLANEOUS) IMPLANT
WATER STERILE IRR 1000ML POUR (IV SOLUTION) ×2 IMPLANT

## 2016-01-22 NOTE — Progress Notes (Signed)
  Radiation Oncology         [phone] ________________________________  Name: [name] MRN: [id number]  Date: 01/18/2016  DOB: [birth year]  SIMULATION AND TREATMENT PLANNING NOTE  DIAGNOSIS:     ICD-9-CM ICD-10-CM   1. Brain metastasis (HCC) 198.3 C79.31   2. Cancer of upper lobe of right lung (HCC) 162.3 C34.11      Site:   1.  Whole brain radiation treatment 2.  Right lung  NARRATIVE:  The patient was brought to the [facility].  Identity was confirmed.  All relevant records and images related to the planned course of therapy were reviewed.   Written consent to proceed with treatment was confirmed which was freely given after reviewing the details related to the planned course of therapy had been reviewed with the patient.  Then, the patient was set-up in a stable reproducible  supine position for radiation therapy.  CT images were obtained.  Surface markings were placed.    Medically necessary complex treatment device(s) for immobilization:  Customized thermoplastic head cast.   The CT images were loaded into the planning software.  Then the target and avoidance structures were contoured.  Treatment planning then occurred.  The radiation prescription was entered and confirmed.  A total of 6 complex treatment devices were fabricated which relate to the designed radiation treatment fields:  4 radiation treatment fields for the brain including to reduced fields to optimize dose homogeneity and to fields to treat the right lung. Each of these customized fields/ complex treatment devices will be used on a daily basis during the radiation course. I have requested : 3D Simulation  I have requested a DVH of the following structures: Target volume, spinal cord, lungs, esophagus.   The patient will undergo daily image guidance to ensure accurate localization of the target, and adequate minimize dose to the normal surrounding structures in close proximity to the  target.   PLAN:  The patient will receive 30 Gy in 10 fractions to both target regions above.  ________________________________   Jodelle Gross, MD, PhD

## 2016-01-22 NOTE — H&P (Signed)
[address]       [name],[name] [id number]             [phone]                    [place] Medical Record #[id number] Date of Birth: 04/19/1956  Referring: No ref. provider found Primary Care: [name], MD  Chief Complaint:    No chief complaint on file.   History of Present Illness:    [name] 60 y.o. male is seen in the office for  r large right lung mass with moderate pleural effusion previously drained on 2 occasions in early May. 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 Over the past 4 weeks his overall health has markedly deteriorated. He is now on home oxygen and recently had diagnosis of stage IV lung cancer with brain metastasis made he's to start brain radiation tomorrow. In early May he presented with increasing shortness of breath and cough and was found to have a large right lung mass with a right pleural effusion, on 2 occasions 2 days apart he had thoracentesis done with drainage of over 2 L. He is now on home oxygen continuously. The past is good diagnosed with atrial fibrillation and for a period of time was anticoagulated in August 2016 but currently is not on any. The patient comes to the office today to discuss possible placement of Pleurx catheter      Current Activity/ Functional Status:  Patient is not independent with mobility/ambulation, transfers, ADL's, IADL's.   Zubrod Score: At the time of surgery this patient's most appropriate activity status/level should be described as: '[]'$     0    Normal activity, no symptoms '[]'$     1    Restricted in physical strenuous activity but ambulatory, able to do out light work '[]'$     2    Ambulatory and capable of self care, unable to do work activities, up and about               >50 % of waking hours                              '[x]'$     3    Only limited self care, in bed greater than 50% of  waking hours '[]'$     4    Completely disabled, no self care, confined to bed or chair '[]'$     5    Moribund   Past Medical History  Diagnosis Date  . HTN (hypertension)     pt unaware of this  . Persistent atrial fibrillation ([facility])     now SR after DCCV   . Obesity   . H/O hiatal hernia   . Moderate mitral regurgitation   . Hyperthyroidism     treated with tapazole  . Brain cancer ([place] [facility]) 01/14/16    multiple brain metastases  . Non-small cell cancer of right lung ([facility]) 01/11/2016  . Brain metastasis ([facility]) 01/18/2016  . Shortness of breath dyspnea     using 2 L O2   . Anxiety     Past Surgical History  Procedure Laterality Date  . Lower back surgery      herniated nucleus pulposus left L5-S1; semihemilaminectomy and diskectomy with microdissection with microscope  . Right  cornea repair      secondary to injury from a bungee cord  . Tonsillectomy    . Cardioversion N/A 05/08/2013    unsuccessful for persistent afib  . Tee without cardioversion N/A 06/24/2014    Procedure: TRANSESOPHAGEAL ECHOCARDIOGRAM (TEE);  Surgeon: Josue Hector, MD;  Location: [facility];  Service: Cardiovascular;  Laterality: N/A;  . Cardioversion N/A 04/09/2015    Procedure: CARDIOVERSION;  Surgeon: Josue Hector, MD;  Location: [facility] ENDOSCOPY;  Service: Cardiovascular;  Laterality: N/A;  . Video bronchoscopy Bilateral 01/01/2016    Procedure: VIDEO BRONCHOSCOPY WITH FLUORO;  Surgeon: Tanda Rockers, MD;  Location: [facility] ENDOSCOPY;  Service: Cardiopulmonary;  Laterality: Bilateral;  . Thoracentesis Right 01/10/16& 01/13/16    01/09/14= x1 liter , 01/13/16=x 2 liters  . Vasectomy      Family History  Problem Relation Age of Onset  . Diabetes Brother   . Hyperlipidemia Mother   . Pancreatic cancer Father 54  . Thyroid disease Mother   . Lung cancer Sister   . Ovarian cancer Sister     half sister    Social History   Social History  . Marital Status: Married    Spouse Name: N/A  . Number of Children: N/A  .  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 Smoking status: Former Smoker -- 1.50 packs/day for 27 years    Types: Cigarettes    Quit date: 09/05/2000  . Smokeless tobacco: Former Systems developer    Quit date: 05/08/2002  . Alcohol Use: No  . Drug Use: No  . Sexual Activity: Not Currently   Other Topics Concern  . Not on file   Social History Narrative   Pt lives in [place] with spouse.  Truck driver    History  Smoking status  . Former Smoker -- 1.50 packs/day for 27 years  . Types: Cigarettes  . Quit date: 09/05/2000  Smokeless tobacco  . Former Systems developer  . Quit date: 05/08/2002    History  Alcohol Use No     No Known Allergies  Current Facility-Administered Medications  Medication Dose Route Frequency Provider Last Rate Last Dose  . cefUROXime (ZINACEF) 1.5 g in dextrose 5 % 50 mL IVPB  1.5 g Intravenous To SS-Surg Grace Isaac, MD      . lactated ringers infusion   Intravenous Once Oleta Mouse, MD       Facility-Administered Medications Ordered in Other Encounters  Medication Dose Route Frequency Provider Last Rate Last Dose  . SONAFINE emulsion 1 application  1 application Topical BID Hayden Pedro, PA-C   1 application at 09/98/33 8250      Review of Systems:     Cardiac Review of Systems: Y or N  Chest Pain [ y  ]  Resting SOB Blue.Reese   ] Exertional SOB  [ y ]  Vertell Limber Blue.Reese  ]   Pedal Edema Florencio.Farrier   ]    Palpitations [ n ] Syncope  Florencio.Farrier  ]   Presyncope [ n  ]  General Review of Systems: [Y] = yes [  ]=no Constitional: recent weight change Blue.Reese  ];  Wt loss over the last 3 months [   ] anorexia [ y ]; fatigue [ y ]; nausea [  ]; night sweats [  ]; fever [  ]; or chills [  ];          Dental:  poor dentition[  ]; Last Dentist visit:   Eye : blurred vision [  ]; diplopia [   ]; vision changes [  ];  Amaurosis fugax[  ]; Resp: cough [  ];  wheezing[  y];  hemoptysis[ n ]; shortness of breath[y  ]; paroxysmal nocturnal  dyspnea[y  ]; dyspnea on exertion[y  ]; or orthopnea[ y];  GI:  gallstones[  ], vomiting[  ];  dysphagia[  ]; melena[  ];  hematochezia [  ]; heartburn[  ];   Hx of  Colonoscopy[  ]; GU: kidney stones [  ]; hematuria[  ];   dysuria [  ];  nocturia[  ];  history of     obstruction [  ]; urinary frequency [  ]             Skin: rash, swelling[  ];, hair loss[  ];  peripheral edema[  ];  or itching[  ]; Musculosketetal: myalgias[  ];  joint swelling[  ];  joint erythema[  ];  joint pain[  ];  back pain[  ];  Heme/Lymph: bruising[  ];  bleeding[  ];  anemia[  ];  Neuro: TIA[  ];  headaches[  ];  stroke[  ];  vertigo[  ];  seizures[n  ];   paresthesias[  ];  difficulty walking[  ];  Psych:depression[  ]; anxiety[  ];  Endocrine: diabetes[n  ];  thyroid dysfunction[ y ];  Immunizations: Flu up to date Blue.Reese  ]; Pneumococcal up to date n[  ];  Other:  Physical Exam: BP 122/64 mmHg  Pulse 92  Temp(Src) 97.6 F (36.4 C) (Oral)  Resp 20  Ht '5\' 10"'$  (1.778 m)  Wt 247 lb 14.4 oz (112.447 kg)  BMI 35.57 kg/m2  SpO2 92%  PHYSICAL EXAMINATION: Patient on oxygen. His fatigue General appearance: alert and cooperative Head: Normocephalic, without obvious abnormality, atraumatic Neck: no adenopathy, no carotid bruit, no JVD, supple, symmetrical, trachea midline and thyroid not enlarged, symmetric, no tenderness/mass/nodules Lymph nodes: Cervical, supraclavicular, and axillary nodes normal. Resp: Decreased breath sounds over the right base  Back: symmetric, no curvature. ROM normal. No CVA tenderness. Cardio: regular rate and rhythm, S1, S2 normal, no murmur, click, rub or gallop GI: soft, non-tender; bowel sounds normal; no masses,  no organomegaly Extremities: extremities normal, atraumatic, no cyanosis  and Homans sign is negative, no sign of DVT but significant bilateral pedal edema Neurologic: Grossly normal  Diagnostic Studies & Laboratory data:     Recent Radiology Findings:  Ct Chest W  Contrast  12/31/2015  CLINICAL DATA:  Cough x6 weeks, abnormal chest radiograph EXAM: CT CHEST WITH CONTRAST TECHNIQUE: Multidetector CT imaging of the chest was performed during intravenous contrast administration. CONTRAST:  30m ISOVUE-300 IOPAMIDOL (ISOVUE-300) INJECTION 61% COMPARISON:  Chest radiographs dated 12/28/2015 FINDINGS: Mediastinum/Nodes: The heart is normal in size. No pericardial effusion. Coronary atherosclerosis in the LAD. Atherosclerotic calcifications of the aortic arch. Thoracic lymphadenopathy, including: --8 mm short axis left supraclavicular node (series 2/ image 5) --2.8 cm short axis high right paratracheal node (series 2/ image 46) --10 mm short axis AP window node (series 2/image 54) --3.5 cm short axis low right paratracheal node (series 2/ image 56) --1.8 cm short axis subcarinal node (series 2/ image 67) Lungs/Pleura: 11.9 x 8.0 x 11.4 cm mass in the medial right upper and middle lobes (series 2/image 47), compatible with primary bronchogenic neoplasm. Mass abuts the medial pleural surface, occludes the right mainstem bronchus (series 2/image 60), and directly  extends into the right perihilar region and mediastinum. Secondary ground-glass opacity with increased interstitial markings in the right middle lobe and right lower lobe (series 5/image 81), likely reflecting lymphangitic spread of tumor, less likely postobstructive opacity. Additional satellite nodularity in the right middle and lower lobes (for example, series 5/images 70, 83, 91, 92, and 105), measuring up to 8 mm, suspicious for metastases. Moderate to large right pleural effusion. Pleural-based nodularity (series 2/ image 121), suggesting malignant effusion. Associated compressive atelectasis in the right lower lobe. Left lung is notable for mild paraseptal emphysematous changes. No pneumothorax. Upper abdomen: Visualized upper abdomen is notable for a 10 mm hypoenhancing lesion in the right hepatic dome (series 2/image  115), indeterminate. Additional 10 mm hypoenhancing lesion in the central left hepatic lobe (series 2/ image 139). No evidence of adrenal metastases. Small upper abdominal lymph nodes measuring up to 9 mm short axis (series 2/ image 156), within normal limits. Musculoskeletal: Mild degenerative changes of the lower thoracic spine. No focal osseous lesions. IMPRESSION: 11.9 cm mass in the medial right upper and middle lobes, compatible with primary bronchogenic neoplasm. Mass occludes the right mainstem bronchus, extends to the right perihilar region, and abuts/invades the mediastinum. Suspected lymphangitic spread of tumor in the right middle and lower lobes, less likely postobstructive opacity. Associated satellite nodularity in the right middle and lower lobes measuring up to 8 mm, suspicious for pulmonary metastases. Associated left supraclavicular and mediastinal nodal metastases. Moderate to large right pleural effusion with associated pleural-based nodularity, likely malignant. Two 10 mm hypoenhancing lesions in the liver, indeterminate. Electronically Signed   By: Julian Hy M.D.   On: 12/31/2015 15:27   Mr Jeri Cos ZO Contrast  01/14/2016  ADDENDUM REPORT: 01/14/2016 11:32 ADDENDUM: Study discussed by telephone with Dr. Curt Bears on 01/14/2016 At 1127 hours. Electronically Signed   By: Genevie Ann M.D.   On: 01/14/2016 11:32  01/14/2016  CLINICAL DATA:  60 year old male with recently diagnosed stage IV (T3, N3, M1b) non-small cell lung cancer. Staging. Subsequent encounter. EXAM: MRI HEAD WITHOUT AND WITH CONTRAST TECHNIQUE: Multiplanar, multiecho pulse sequences of the brain and surrounding structures were obtained without and with intravenous contrast. CONTRAST:  20 mL MultiHance. COMPARISON:  PET-CT 01/08/2016 FINDINGS: Post-contrast images are degraded by motion artifact despite repeated imaging attempts. There are multiple subcentimeter enhancing lesions in the bilateral cerebral hemispheres  most compatible with brain metastases. There is a solitary 16 mm rim enhancing metastasis in the anterior right frontal lobe with surrounding cerebral edema (series 12, image 27 and series 8, image 15). There is also confluent cerebral edema in the left anterior frontal lobe related to a 9 mm metastasis there are (series 8, image 17 and series 12, image 31. There is a solitary punctate metastasis suspected in the posterior superior left cerebellum. In all about 14 small metastases are identified. No dural thickening identified. However, there are also multiple small nonenhancing areas of restricted diffusion in both cerebral and cerebellar hemispheres (series 5). None of these however appears to be enhancing following contrast, such that these areas are most compatible with small scattered acute infarcts. The larger of these have T2 hyperintensity, including in the left splenium of the corpus callosum (series 7, image 16, seen to be nonenhancing on series 12, image 30). There is a superimposed small chronic left cerebellar lacunar infarct. No midline shift or loss of basilar cisterns. No acute intracranial hemorrhage identified. No ventriculomegaly. Major intracranial vascular flow voids are preserved. The left vertebral artery  appears dominant. Negative pituitary and cervicomedullary junction. Grossly negative visualized cervical spinal cord. Mildly to moderately heterogeneous bone marrow signal throughout the visible skull and upper cervical spine. Of these, there is associated abnormal diffusion at the left occipital condyle compatible with bone metastasis and this was hypermetabolic on PET-CT. A right side C2-C3 bone metastasis was better demonstrated by PET. Visible internal auditory structures appear normal. Mastoids are clear. Paranasal sinuses are clear. Negative orbit and scalp soft tissues. IMPRESSION: 1. Positive for multiple small brain metastases. Approximately fourteen enhancing metastases are  identified ranging from punctate to 16 mm. There is associated cerebral edema, most pronounced in the anterior frontal lobes, without significant intracranial mass effect. 2. There also are numerous superimposed small nonenhancing areas of restricted diffusion in both cerebral and cerebellar hemispheres, but these are most compatible with small acute embolic infarcts. 3. Left occipital condyle skullbase metastasis. Cervical spine metastatic disease better demonstrated on recent PET-CT. Electronically Signed: By: Genevie Ann M.D. On: 01/14/2016 11:22   Nm Pet Image Initial (pi) Skull Base To Thigh  01/08/2016  CLINICAL DATA:  Initial treatment strategy for lung mass. EXAM: NUCLEAR MEDICINE PET SKULL BASE TO THIGH TECHNIQUE: 12.86 mCi F-18 FDG was injected intravenously. Full-ring PET imaging was performed from the skull base to thigh after the radiotracer. CT data was obtained and used for attenuation correction and anatomic localization. FASTING BLOOD GLUCOSE:  Value: 126 mg/dl COMPARISON:  CT chest dated 12/31/2015 FINDINGS: NECK No hypermetabolic lymph nodes in the neck. Two dominant lesions in the left thyroid gland, mid representative [name] SUV 13.5, nonspecific. CHEST Right upper and middle lobe mass, [name] SUV 16.0, compatible with primary bronchogenic neoplasm. This was better evaluated on recent CT, when it measured approximately 11.9 x 8.0 x 11.4 cm. Associated near complete collapse of the right lung secondary to a large right pleural effusion, malignant, with multiple enhancing pleural-based nodules, representative [name] SUV 16.2. Left lung is grossly clear.  No pneumothorax. Widespread thoracic lymphadenopathy, [name] SUV 16.6 in the subcarinal region. 9 mm short axis left supraclavicular nodes are present with [name] SUV 9.1. The heart is normal in size.  Trace pericardial fluid. ABDOMEN/PELVIS Motion degraded images. Three hepatic metastases, including a 2.1 cm lesion in segment 7 with [name] SUV 20.1 and a 1.5 cm  lesion in segment 4A with [name] SUV 14.7. No abnormal hypermetabolic activity within the pancreas, adrenal glands, or spleen. No hypermetabolic lymph nodes in the abdomen or pelvis. SKELETON Innumerable intramuscular and subcutaneous soft tissue metastases. For example, representative [name] SUV 17.2 along the posterior left shoulder and representative [name] SUV 15.2 in the left gluteal region. Multifocal osseous metastases in the visualized axial and appendicular skeleton. For example: --Left clivus/skull base, [name] SUV 18.0 --Lower cervical spine, [name] SUV 6.0 --Left acromion, [name] SUV 8.2 --Lower thoracic spine, [name] SUV 5.4 --Left proximal femur, [name] SUV 15.0 IMPRESSION: Stage IV right lung cancer, with widespread nodal, pleural, hepatic, soft tissue, and osseous metastases, as above. Electronically Signed   By: Julian Hy M.D.   On: 01/08/2016 09:53     I have independently reviewed the above radiologic studies.  Recent Lab Findings: Lab Results  Component Value Date   WBC 40.5* 01/22/2016   HGB 14.9 01/22/2016   HCT 47.6 01/22/2016   PLT 61* 01/22/2016   GLUCOSE 154* 01/22/2016   CHOL 126 02/13/2012   TRIG 111.0 02/13/2012   HDL 32.70* 02/13/2012   LDLCALC 71 02/13/2012   ALT 64* 01/18/2016   AST 19  01/18/2016   NA 137 01/22/2016   K 4.8 01/22/2016   CL 101 01/22/2016   CREATININE 0.89 01/22/2016   BUN 24* 01/22/2016   CO2 25 01/22/2016   TSH 2.39 12/28/2015   INR 1.42 01/22/2016      Assessment / Plan:   Stage IV (T3, N3, M1b) non-small cell lung cancer, adenocarcinoma, pending further diagnostic studies diagnosed in April 2017 and presented with large right upper and middle lobe lung mass in addition to extensive mediastinal and supraclavicular lymphadenopathy as well as liver, bone, brain and soft tissue lesions. Molecular studies are still pending    Has had significant right pleural effusion, tapped twice  I discussed with the patient his wife and 2 daughters the natural  history of malignant effusions and have recommended to the patient that in the near future that we've proceeded with placement of right Pleurx catheter to be able to control his right effusions without necessitating recurrent visits to the emergency room. Risks and options were discussed with them in detail and he is willing to proceed with Pleurx catheter placement on the right Patient started  brain irradiation yesterday  The goals risks and alternatives of the planned surgical procedure  Right pleurix cathater   have been discussed with the patient in detail. The risks of the procedure including death, infection, stroke, myocardial infarction, bleeding, blood transfusion have all been discussed specifically.  I have quoted [name] a 4 % of perioperative mortality and a complication rate as high as 20 %. The patient's questions have been answered.[name] is willing  to proceed with the planned procedure.   Grace Isaac MD      [address] [place], [address] Office [phone]   Beeper [phone]  01/22/2016 12:00 PM

## 2016-01-22 NOTE — Addendum Note (Signed)
Encounter addended by: Kyung Rudd, MD on: 01/22/2016 10:07 PM<BR>     Documentation filed: Problem List

## 2016-01-22 NOTE — Progress Notes (Signed)
Weekly rad txs brain, lung, patient on oxygen 2 liters n/c, some slight slurring of words at times, no nausea, dizzy ness, is sob, will have pleur ex catheter placement today at [facility] at 1000am, takes decadron '4mg'$  tid, no thrush seen or noted,   post sim done  sonafine given,my busines card, radiation therapy and you, discussed ways to manage side effects, fatigue, nuase,vomiting, skin irritation, throat changes, pain, difficulty swallowing, , may need to eat 5-6 smaller meals with snacks, ensure or boost to increas calories, increas protein in diet, verbal understanding,teach back given  BP 118/61 mmHg  Pulse 83  Temp(Src) 97.8 F (36.6 C) (Oral)  Resp 20  Wt 247 lb 14.4 oz (112.447 kg)  SpO2 90%  Wt Readings from Last 3 Encounters:  01/22/16 247 lb 14.4 oz (112.447 kg)  01/19/16 253 lb (114.76 kg)  01/18/16 256 lb (116.121 kg)

## 2016-01-22 NOTE — Progress Notes (Signed)
Left in box..fmla forms for patient and his wife Butch Penny- I left for dr. Julien Nordmann to sign also (3)

## 2016-01-22 NOTE — Transfer of Care (Signed)
Immediate Anesthesia Transfer of Care Note  Patient: [name]  Procedure(s) Performed: Procedure(s): INSERTION RIGHT PLEURAL DRAINAGE CATHETER WITH FLUORO AND ULTRASOUND DRAINAGE OF RIGHT PLEURAL EFFUSION (Right)  Patient Location: PACU  Anesthesia Type:MAC  Level of Consciousness: awake, alert , oriented and patient cooperative  Airway & Oxygen Therapy: Patient Spontanous Breathing and Patient connected to nasal cannula oxygen  Post-op Assessment: Report given to RN, Post -op Vital signs reviewed and stable and Patient moving all extremities  Post vital signs: Reviewed and stable  Last Vitals:  Filed Vitals:   01/22/16 1014  BP: 122/64  Pulse: 92  Temp: 36.4 C  Resp: 20    Last Pain:  Filed Vitals:   01/22/16 1017  PainSc: 0-No pain      Patients Stated Pain Goal: 0 (41/96/22 2979)  Complications: No apparent anesthesia complications

## 2016-01-22 NOTE — Progress Notes (Signed)
   Department of Radiation Oncology  Phone:  [phone] Fax:        [phone]  Weekly Treatment Note    Name: [name] Date: 01/22/2016 MRN: [id number] DOB: 04-04-1956   Diagnosis:     ICD-9-CM ICD-10-CM   1. Brain metastasis (HCC) 198.3 C79.31 SONAFINE emulsion 1 application     Current dose: 9 Gy  Current fraction: 3   MEDICATIONS: Current Facility-Administered Medications  Medication Dose Route Frequency Provider Last Rate Last Dose  . SONAFINE emulsion 1 application  1 application Topical BID Hayden Pedro, PA-C   1 application at 38/18/40 3754   No current outpatient prescriptions on file.   Facility-Administered Medications Ordered in Other Encounters  Medication Dose Route Frequency Provider Last Rate Last Dose  . cefUROXime (ZINACEF) 1.5 g in dextrose 5 % 50 mL IVPB  1.5 g Intravenous To SS-Surg Grace Isaac, MD      . lactated ringers infusion   Intravenous Once Oleta Mouse, MD         ALLERGIES: Review of patient's allergies indicates no known allergies.   LABORATORY DATA:  Lab Results  Component Value Date   WBC 40.5* 01/22/2016   HGB 14.9 01/22/2016   HCT 47.6 01/22/2016   MCV 90.7 01/22/2016   PLT PENDING 01/22/2016   Lab Results  Component Value Date   NA 137 01/18/2016   K 5.1 01/18/2016   CL 102 01/11/2016   CO2 27 01/18/2016   Lab Results  Component Value Date   ALT 64* 01/18/2016   AST 19 01/18/2016   ALKPHOS 76 01/18/2016   BILITOT <0.30 01/18/2016     NARRATIVE: [name] was seen today for weekly treatment management. The chart was checked and the patient's films were reviewed.  The patient states that he is doing satisfactorily. He has had 3 fractions this week. No increase in headaches, no nausea. He is having a Port-A-Cath placed. He is having some shortness of breath and we are hopeful that this will significantly improve his breathing in addition to his palliative radiation treatment to  the right lung.  PHYSICAL EXAMINATION: weight is 247 lb 14.4 oz (112.447 kg). His oral temperature is 97.8 F (36.6 C). His blood pressure is 118/61 and his pulse is 83. His respiration is 20 and oxygen saturation is 90%.      No thrush present  ASSESSMENT: The patient is doing satisfactorily with treatment.  PLAN: We will continue with the patient's radiation treatment as planned.

## 2016-01-22 NOTE — Brief Op Note (Signed)
      [address]       ,[place] [address]             [phone]      01/22/2016  1:14 PM  PATIENT:  [name]  60 y.o. male  PRE-OPERATIVE DIAGNOSIS:  RIGHT PLEURAL EFFUSION  POST-OPERATIVE DIAGNOSIS:  RIGHT PLEURAL EFFUSION  PROCEDURE:  Procedure(s): INSERTION RIGHT PLEURAL DRAINAGE CATHETER WITH FLUORO AND ULTRASOUND DRAINAGE OF RIGHT PLEURAL EFFUSION (Right)  SURGEON:  Surgeon(s) and Role:    * Grace Isaac, MD - Primary    ANESTHESIA:   MAC  EBL:  Total I/O In: 500 [I.V.:500] Out: 5 [Blood:5]  BLOOD ADMINISTERED:none  DRAINS: right pleurix drain   LOCAL MEDICATIONS USED:  LIDOCAINE   SPECIMEN:  No Specimen  DISPOSITION OF SPECIMEN:  N/A  COUNTS:  YES  DICTATION: .Dragon Dictation  PLAN OF CARE: Discharge to home after PACU  PATIENT DISPOSITION:  PACU - hemodynamically stable.   Delay start of Pharmacological VTE agent (>24hrs) due to surgical blood loss or risk of bleeding: yes

## 2016-01-23 DIAGNOSIS — Z48813 Encounter for surgical aftercare following surgery on the respiratory system: Secondary | ICD-10-CM

## 2016-01-23 NOTE — Anesthesia Preprocedure Evaluation (Signed)
Anesthesia Evaluation  Patient identified by MRN, date of birth, ID band Patient awake    Reviewed: Allergy & Precautions, NPO status , Patient's Chart, lab work & pertinent test results, reviewed documented beta blocker date and time   History of Anesthesia Complications Negative for: history of anesthetic complications  Airway Mallampati: II  TM Distance: >3 FB Neck ROM: Full    Dental no notable dental hx.    Pulmonary shortness of breath, former smoker,   On right side   + decreased breath sounds      Cardiovascular hypertension, Pt. on medications and Pt. on home beta blockers  Rhythm:Regular     Neuro/Psych PSYCHIATRIC DISORDERS Anxiety negative neurological ROS     GI/Hepatic Neg liver ROS, hiatal hernia, GERD  Medicated and Controlled,  Endo/Other  Morbid obesity  Renal/GU negative Renal ROS     Musculoskeletal   Abdominal   Peds  Hematology   Anesthesia Other Findings   Reproductive/Obstetrics                             Anesthesia Physical Anesthesia Plan  ASA: III  Anesthesia Plan: MAC   Post-op Pain Management:    Induction: Intravenous  Airway Management Planned: Natural Airway, Nasal Cannula and Simple Face Mask  Additional Equipment: None  Intra-op Plan:   Post-operative Plan:   Informed Consent: I have reviewed the patients History and Physical, chart, labs and discussed the procedure including the risks, benefits and alternatives for the proposed anesthesia with the patient or authorized representative who has indicated his/her understanding and acceptance.   Dental advisory given  Plan Discussed with: CRNA and Surgeon  Anesthesia Plan Comments:         Anesthesia Quick Evaluation

## 2016-01-23 NOTE — Anesthesia Postprocedure Evaluation (Signed)
Anesthesia Post Note  Patient: [name]  Procedure(s) Performed: Procedure(s) (LRB): INSERTION RIGHT PLEURAL DRAINAGE CATHETER WITH FLUORO AND ULTRASOUND DRAINAGE OF RIGHT PLEURAL EFFUSION (Right)  Patient location during evaluation: PACU Anesthesia Type: MAC Level of consciousness: awake Pain management: pain level controlled Vital Signs Assessment: post-procedure vital signs reviewed and stable Respiratory status: spontaneous breathing and patient connected to nasal cannula oxygen Cardiovascular status: stable Postop Assessment: no signs of nausea or vomiting Anesthetic complications: no    Last Vitals:  Filed Vitals:   01/22/16 1435 01/22/16 1444  BP:  108/55  Pulse: 86 92  Temp: 36.5 C   Resp: 17 16    Last Pain:  Filed Vitals:   01/22/16 1504  PainSc: 0-No pain                 Clinton Dragone

## 2016-01-25 ENCOUNTER — Ambulatory Visit
Admission: RE | Admit: 2016-01-25 | Discharge: 2016-01-25 | Disposition: A | Discharge status: Home / Self Care | Payer: PRIVATE HEALTH INSURANCE | Source: Ambulatory Visit | Attending: Radiation Oncology | Admitting: Radiation Oncology

## 2016-01-25 ENCOUNTER — Encounter: Payer: Self-pay | Admitting: Internal Medicine

## 2016-01-25 DIAGNOSIS — Z510 Encounter for antineoplastic radiation therapy: Secondary | ICD-10-CM | POA: Diagnosis not present

## 2016-01-25 NOTE — Progress Notes (Signed)
Met with patient and family members who had financial concerns. Patient has no income and having to pay insurance premium OOP and wanted to know how to apply for disability. Gave booklet for this concern and also gave a Medicaid application. Advised them they could bring application back here to be faxed on turn into DSS. Also advised him since he is currently in Radiation that Ailene Ravel or Jenny Reichmann are the advocates for Radiation. They asked what other financial assistance may be available. Advised them that he may apply for the [place] and how it works. Gave them a ist of what it covers and advised proof of household income would be needed. Questions about caregiver assistance were directed to [place] who agreed to meet with them briefly before a meeting. My card was given for any additional financial questions or concerns. Escorted them upstairs to meet Lauren.

## 2016-01-26 ENCOUNTER — Encounter (HOSPITAL_COMMUNITY): Payer: Self-pay | Admitting: Cardiothoracic Surgery

## 2016-01-26 ENCOUNTER — Telehealth: Payer: Self-pay | Admitting: *Deleted

## 2016-01-26 ENCOUNTER — Other Ambulatory Visit: Payer: Self-pay | Admitting: Radiation Oncology

## 2016-01-26 ENCOUNTER — Ambulatory Visit
Admission: RE | Admit: 2016-01-26 | Discharge: 2016-01-26 | Disposition: A | Discharge status: Home / Self Care | Payer: PRIVATE HEALTH INSURANCE | Source: Ambulatory Visit | Attending: Radiation Oncology | Admitting: Radiation Oncology

## 2016-01-26 DIAGNOSIS — C3491 Malignant neoplasm of unspecified part of right bronchus or lung: Secondary | ICD-10-CM

## 2016-01-26 DIAGNOSIS — C7931 Secondary malignant neoplasm of brain: Secondary | ICD-10-CM

## 2016-01-26 DIAGNOSIS — Z510 Encounter for antineoplastic radiation therapy: Secondary | ICD-10-CM | POA: Diagnosis not present

## 2016-01-26 MED ORDER — DEXAMETHASONE 4 MG PO TABS: 4.0000 mg | ORAL_TABLET | Freq: Three times a day (TID) | ORAL | Status: AC

## 2016-01-26 NOTE — Telephone Encounter (Signed)
Wife called stating patient is almost out of his decadron, he takes '4mg'$  3x day, and that Dr. Lisbeth Renshaw stated he would refill it to keep taking  The '4mg'$   3x day, nothing in his note , as MD is off today will ask our PA Shona Simpson  To refill this rx to [facility] on [address], [address] and will call wife back with status 2:29 PM

## 2016-01-26 NOTE — Op Note (Signed)
NAMEKaspar, Albornoz Aidon              ACCOUNT NO.:  [id number]  MEDICAL RECORD NO.:  [id number]  LOCATION:  [facility]                         FACILITY:  [facility]  PHYSICIAN:  Lanelle Bal, MD    DATE OF BIRTH:  1956/02/16  DATE OF PROCEDURE:  01/22/2016 DATE OF DISCHARGE:  01/22/2016                              OPERATIVE REPORT   PREOPERATIVE DIAGNOSIS:  Right lung mass with recurrent right pleural effusion.  POSTOPERATIVE DIAGNOSIS:  Right lung mass with recurrent right pleural effusion.  SURGICAL PROCEDURE:  Placement of right PleurX catheter under fluoroscopic and ultrasound guidance.  SURGEON:  Lanelle Bal, M.D.  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  The effusion had been tapped twice previously and has recurred.  The patient was seen for consideration of PleurX catheter. This was recommended to him.  Risks and options were discussed and he signed informed consent.  DESCRIPTION OF PROCEDURE:  Under light intravenous sedation, the patient was positioned on the OR table slightly elevated.  The right side had previously been marked.  Time-out was performed.  The right chest was prepped with Betadine and draped in sterile manner.  Using the SonoSite ultrasound, pleural fluid was easily identified.  Fluoroscopically, a 16- gauge needle was introduced into the right pleural space.  A guidewire was positioned into the pleural space with fluoroscopic guidance.  A small counter incision was made anterior and a PleurX catheter was tunneled subpleurally over the guidewire.  A dilator was placed and then a peel-away sheath was introduced with PleurX catheter into the right pleural space.  We then drained 1.5 L of straw-colored pleural fluid. As this was already previously been examined by Pathology, we did not send further fluid for cytologic evaluation.  A nylon suture was used  to hold the PleurX catheter in place and the insertion site was closed with a 4-0 subcuticular stitch.  Dermabond was placed.  Dressings were applied.  The patient tolerated the procedure without obvious complication and was transferred to the recovery room for postoperative observation.  Blood loss was minimal.  Sponge and needle count was correct.     Lanelle Bal, MD     EG/MEDQ  D:  01/26/2016  T:  01/26/2016  Job:  [id number]

## 2016-01-26 NOTE — Telephone Encounter (Signed)
Returned call to the wife, informed her that our PA-Alison Dara Lords has e-scribed refill on patient's dexamethasone(decadron), and to wait 1-2 hours for it to go through,wife thanked this RN and [employer] PA-C .2:35 PM .

## 2016-01-27 ENCOUNTER — Ambulatory Visit
Admission: RE | Admit: 2016-01-27 | Discharge: 2016-01-27 | Disposition: A | Discharge status: Home / Self Care | Payer: PRIVATE HEALTH INSURANCE | Source: Ambulatory Visit | Attending: Radiation Oncology | Admitting: Radiation Oncology

## 2016-01-27 ENCOUNTER — Ambulatory Visit: Payer: PRIVATE HEALTH INSURANCE

## 2016-01-27 ENCOUNTER — Encounter: Payer: Self-pay | Admitting: Internal Medicine

## 2016-01-27 DIAGNOSIS — Z510 Encounter for antineoplastic radiation therapy: Secondary | ICD-10-CM | POA: Diagnosis not present

## 2016-01-27 NOTE — Progress Notes (Signed)
left in box- disab form- left for dr. Mckinley Jewel to sign- recd fmla for wife and patient also- all 3 left for dr to sign-left forms for patient to pick up

## 2016-01-28 ENCOUNTER — Ambulatory Visit: Payer: PRIVATE HEALTH INSURANCE

## 2016-01-28 ENCOUNTER — Ambulatory Visit
Admission: RE | Admit: 2016-01-28 | Discharge: 2016-01-28 | Disposition: A | Discharge status: Home / Self Care | Payer: PRIVATE HEALTH INSURANCE | Source: Ambulatory Visit | Attending: Radiation Oncology | Admitting: Radiation Oncology

## 2016-01-28 DIAGNOSIS — Z510 Encounter for antineoplastic radiation therapy: Secondary | ICD-10-CM | POA: Diagnosis not present

## 2016-01-29 ENCOUNTER — Ambulatory Visit
Admission: RE | Admit: 2016-01-29 | Discharge: 2016-01-29 | Disposition: A | Discharge status: Home / Self Care | Payer: PRIVATE HEALTH INSURANCE | Source: Ambulatory Visit | Attending: Radiation Oncology | Admitting: Radiation Oncology

## 2016-01-29 ENCOUNTER — Encounter: Payer: Self-pay | Admitting: Radiation Oncology

## 2016-01-29 VITALS — BP 115/55 | HR 92

## 2016-01-29 DIAGNOSIS — C3411 Malignant neoplasm of upper lobe, right bronchus or lung: Secondary | ICD-10-CM

## 2016-01-29 DIAGNOSIS — C7931 Secondary malignant neoplasm of brain: Secondary | ICD-10-CM

## 2016-01-29 DIAGNOSIS — Z510 Encounter for antineoplastic radiation therapy: Secondary | ICD-10-CM | POA: Diagnosis not present

## 2016-01-29 NOTE — Progress Notes (Signed)
Department of Radiation Oncology  Phone:  [phone] Fax:        [phone]  Weekly Treatment Note    Name: [name] Date: 01/29/2016 MRN: [id number] DOB: 21-Sep-1955   Diagnosis:     ICD-9-CM ICD-10-CM   1. Brain metastasis (HCC) 198.3 C79.31   2. Cancer of upper lobe of right lung (HCC) 162.3 C34.11      Current dose: 24 Gy  Current fraction: 8   MEDICATIONS: Current Outpatient Prescriptions  Medication Sig Dispense Refill  . acetaminophen-codeine (TYLENOL #3) 300-30 MG tablet One every 4 hours as needed for cough 100 tablet 0  . ALPRAZolam (XANAX) 0.25 MG tablet Take 1 tablet (0.25 mg total) by mouth at bedtime as needed for anxiety. 30 tablet 0  . budesonide-formoterol (SYMBICORT) 80-4.5 MCG/ACT inhaler Take 2 puffs first thing in am and then another 2 puffs about 12 hours later.    Marland Kitchen dexamethasone (DECADRON) 4 MG tablet Take 1 tablet (4 mg total) by mouth 3 (three) times daily. 120 tablet 0  . methimazole (TAPAZOLE) 5 MG tablet Take 1 tablet (5 mg total) by mouth daily. 90 tablet 0  . metoprolol (LOPRESSOR) 50 MG tablet Take 0.5 tablets (25 mg total) by mouth 2 (two) times daily. 180 tablet 2  . omeprazole (PRILOSEC) 40 MG capsule Take 1 capsule (40 mg total) by mouth daily. 90 capsule 1  . OXYGEN Reported on 01/18/2016    . traMADol (ULTRAM) 50 MG tablet Take 1-2 tablets (50-100 mg total) by mouth every 6 (six) hours as needed. 40 tablet 0  . promethazine-codeine (PHENERGAN WITH CODEINE) 6.25-10 MG/5ML syrup Reported on 01/29/2016  0   No current facility-administered medications for this encounter.     ALLERGIES: Review of patient's allergies indicates no known allergies.   LABORATORY DATA:  Lab Results  Component Value Date   WBC 40.5* 01/22/2016   HGB 14.9 01/22/2016   HCT 47.6 01/22/2016   MCV 90.7 01/22/2016   PLT 61* 01/22/2016   Lab Results  Component Value Date   NA 137 01/22/2016   K 4.8 01/22/2016   CL 101 01/22/2016   CO2 25  01/22/2016   Lab Results  Component Value Date   ALT 64* 01/18/2016   AST 19 01/18/2016   ALKPHOS 76 01/18/2016   BILITOT <0.30 01/18/2016     NARRATIVE: [name] was seen today for weekly treatment management. The chart was checked and the patient's films were reviewed.  [name] is here for his 8th fraction of radiation to his Brain and Right Lung. He complains of Left Hip pain a 7/10. He stated it started yesterday. It is very weak, and he is unable to stand on it. He states it feels "decent" in the morning, but gets weaker as the day goes on. He does have cancer in this area per his report. He has a pleurex which he is draining every night for about 900-1071m each night. He is wearing oxygen at 2 Liters chronically. He denies any headaches, or nausea. He does have some blurry vision, and occasional slurred speech. He reports a good appetite. He does get full faster, and often feels bloated.   BP 115/55 mmHg  Pulse 92  Wt   SpO2 95% I was unable to get a temperature orally or axillary even after multiple attempts.   PHYSICAL EXAMINATION: blood pressure is 115/55 and his pulse is 92. His oxygen saturation is 95%.      Some proximal weakness  present on the left at the hip  ASSESSMENT: The patient is doing satisfactorily with treatment.  The patient describes some pain in the left hip and more predominantly some weakness. 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 This could be targeted with palliative radiation treatment although I do not believe this accounts for any weakness. I've also looked through the patient's PET scan and do not see any concerning lesions for nerve involvement within the lumbar spine although he does have multifocal involvement within the lumbar spine. Additionally, the patient is on steroids and he is continuing to taper this as we discussed today. He could be experiencing some proximal weakness although his  description is focal on the left today.   PLAN: We will continue with the patient's radiation treatment as planned.   I have placed an order for an MRI scan of the lumbar spine. This would not be typical I believe as a source of his weakness based on his fairly recent PET scan but in the absence of another clear explanation we discussed proceeding with this study. The patient is in agreement.

## 2016-01-29 NOTE — Progress Notes (Signed)
Mr. Bastin is here for his 8th fraction of radiation to his Brain and Right Lung. He complains of Left Hip pain a 7/10. He stated it started yesterday. It is very weak, and he is unable to stand on it. He states it feels "decent" in the morning, but gets weaker as the day goes on. He does have cancer in this area per his report. He has a pleurex which he is draining every night for about 900-1038m each night. He is wearing oxygen at 2 Liters chronically. He denies any headaches, or nausea. He does have some blurry vision, and occasional slurred speech. He reports a good appetite. He does get full faster, and often feels bloated.   BP 115/55 mmHg  Pulse 92  Wt   SpO2 95% I was unable to get a temperature orally or axillary even after multiple attempts.

## 2016-02-02 ENCOUNTER — Ambulatory Visit
Admission: RE | Admit: 2016-02-02 | Discharge: 2016-02-02 | Disposition: A | Discharge status: Home / Self Care | Payer: PRIVATE HEALTH INSURANCE | Source: Ambulatory Visit | Attending: Radiation Oncology | Admitting: Radiation Oncology

## 2016-02-02 ENCOUNTER — Telehealth: Payer: Self-pay | Admitting: *Deleted

## 2016-02-02 DIAGNOSIS — Z510 Encounter for antineoplastic radiation therapy: Secondary | ICD-10-CM | POA: Diagnosis not present

## 2016-02-02 NOTE — Telephone Encounter (Signed)
CALLED PATIENT TO INFORM OF SCHEDULED MRI FOR 02-05-16 @ [facility], LVM FOR A RETURN CALL

## 2016-02-03 ENCOUNTER — Ambulatory Visit
Admission: RE | Admit: 2016-02-03 | Discharge: 2016-02-03 | Disposition: A | Discharge status: Home / Self Care | Payer: PRIVATE HEALTH INSURANCE | Source: Ambulatory Visit | Attending: Radiation Oncology | Admitting: Radiation Oncology

## 2016-02-03 ENCOUNTER — Encounter: Payer: Self-pay | Admitting: Radiation Oncology

## 2016-02-03 ENCOUNTER — Other Ambulatory Visit: Payer: Self-pay | Admitting: *Deleted

## 2016-02-03 VITALS — BP 104/36 | HR 115 | Temp 98.2°F | Resp 22

## 2016-02-03 DIAGNOSIS — C3411 Malignant neoplasm of upper lobe, right bronchus or lung: Secondary | ICD-10-CM

## 2016-02-03 DIAGNOSIS — Z510 Encounter for antineoplastic radiation therapy: Secondary | ICD-10-CM | POA: Diagnosis not present

## 2016-02-03 DIAGNOSIS — C7931 Secondary malignant neoplasm of brain: Secondary | ICD-10-CM

## 2016-02-03 NOTE — Progress Notes (Signed)
Department of Radiation Oncology  Phone:  [phone] Fax:        [phone]  Weekly Treatment Note    Name: [name] Date: 02/03/2016 MRN: [id number] DOB: May 04, 1956   Diagnosis:   No diagnosis found.   Current dose: 30 Gy  Current fraction: 10   MEDICATIONS: Current Outpatient Prescriptions  Medication Sig Dispense Refill  . acetaminophen-codeine (TYLENOL #3) 300-30 MG tablet One every 4 hours as needed for cough 100 tablet 0  . ALPRAZolam (XANAX) 0.25 MG tablet Take 1 tablet (0.25 mg total) by mouth at bedtime as needed for anxiety. 30 tablet 0  . budesonide-formoterol (SYMBICORT) 80-4.5 MCG/ACT inhaler Take 2 puffs first thing in am and then another 2 puffs about 12 hours later.    Marland Kitchen dexamethasone (DECADRON) 4 MG tablet Take 1 tablet (4 mg total) by mouth 3 (three) times daily. 120 tablet 0  . methimazole (TAPAZOLE) 5 MG tablet Take 1 tablet (5 mg total) by mouth daily. 90 tablet 0  . metoprolol (LOPRESSOR) 50 MG tablet Take 0.5 tablets (25 mg total) by mouth 2 (two) times daily. 180 tablet 2  . omeprazole (PRILOSEC) 40 MG capsule Take 1 capsule (40 mg total) by mouth daily. 90 capsule 1  . OXYGEN Reported on 01/18/2016    . promethazine-codeine (PHENERGAN WITH CODEINE) 6.25-10 MG/5ML syrup Reported on 01/29/2016  0  . traMADol (ULTRAM) 50 MG tablet Take 1-2 tablets (50-100 mg total) by mouth every 6 (six) hours as needed. 40 tablet 0   No current facility-administered medications for this encounter.     ALLERGIES: Review of patient's allergies indicates no known allergies.   LABORATORY DATA:  Lab Results  Component Value Date   WBC 40.5* 01/22/2016   HGB 14.9 01/22/2016   HCT 47.6 01/22/2016   MCV 90.7 01/22/2016   PLT 61* 01/22/2016   Lab Results  Component Value Date   NA 137 01/22/2016   K 4.8 01/22/2016   CL 101 01/22/2016   CO2 25 01/22/2016   Lab Results  Component Value Date   ALT 64* 01/18/2016   AST 19 01/18/2016   ALKPHOS 76  01/18/2016   BILITOT <0.30 01/18/2016     NARRATIVE: [name] was seen today for weekly treatment management. The chart was checked and the patient's films were reviewed.  BP 95/56 mmHg  Pulse 107  Temp(Src) 98.2 F (36.8 C) (Oral)  Resp 20  Wt left arm sitting  BP 104/36 mmHg  Pulse 115  Temp(Src) 98.2 F (36.8 C) (Oral)  Resp 22  Wt left arm standing   10/10 radiation treatment brain, 1 month f/u appt given, no c/o pain, or headaches, no dizzyness, is weak and fatigued, c/o stomach being full, hasn't eaten anything today just drinking water,had bowel movement today, last ate Ham sandwich and milk shake last night for dinner, milk shake and butter pecan ice cream, says he feels bloated. Room air sats=100%  Patient states his prior left hip pain has since resolved and would like to cancel his lumbar MRI scheduled for 02/05/16.   PHYSICAL EXAMINATION: oral temperature is 98.2 F (36.8 C). His blood pressure is 104/36 and his pulse is 115. His respiration is 22.      The patient presents in a wheelchair. Alert and oriented.    ASSESSMENT: The patient has successfully completed radiation treatment.  The patient's previous complaints of left hip pain have resolved. I will cancel his lumbar MRI.   PLAN: I advised the  patient to use Gas-X to manage his bloating. The patient also has been given a schedule for tapering his steroids. Follow up in RadOnc in 1 month.   ------------------------------------------------  Jodelle Gross, MD, PhD  This document serves as a record of services personally performed by Kyung Rudd, MD. It was created on his behalf by Derek Mound, a trained medical scribe. The creation of this record is based on the scribe's personal observations and the provider's statements to them. This document has been checked and approved by the attending provider.

## 2016-02-03 NOTE — Progress Notes (Addendum)
BP 95/56 mmHg  Pulse 107  Temp(Src) 98.2 F (36.8 C) (Oral)  Resp 20  Wt  left arm sitting BP 104/36 mmHg  Pulse 115  Temp(Src) 98.2 F (36.8 C) (Oral)  Resp 22  Wt  left arm standing 10/10 radiation treatment brain, 1 month f/u appt given, no c/o pain, or headaches, no dizzyness, is weak and fatigued, c/o stomach being full, hasn't eaten anything today just drinking water,had bowel movement today, last ate  Ham sandwich and milk shake last night for dinner, milk shake and butter pecan ice cream,  Room air sats=100% Wt Readings from Last 3 Encounters:  01/22/16 247 lb 14.4 oz (112.447 kg)  01/22/16 247 lb 14.4 oz (112.447 kg)  01/19/16 253 lb (114.76 kg)   .Decadon '4mg'$   2 tabs daily then on Saturday  1- '4mg'$  tablet daily  Is on a tapered dose 3:26 PM

## 2016-02-04 ENCOUNTER — Encounter: Payer: Self-pay | Admitting: Internal Medicine

## 2016-02-04 ENCOUNTER — Telehealth: Payer: Self-pay | Admitting: Internal Medicine

## 2016-02-04 ENCOUNTER — Other Ambulatory Visit: Payer: Self-pay | Admitting: Medical Oncology

## 2016-02-04 ENCOUNTER — Other Ambulatory Visit (HOSPITAL_BASED_OUTPATIENT_CLINIC_OR_DEPARTMENT_OTHER): Payer: PRIVATE HEALTH INSURANCE

## 2016-02-04 ENCOUNTER — Encounter: Payer: Self-pay | Admitting: *Deleted

## 2016-02-04 ENCOUNTER — Ambulatory Visit: Payer: PRIVATE HEALTH INSURANCE

## 2016-02-04 ENCOUNTER — Encounter (HOSPITAL_COMMUNITY): Payer: PRIVATE HEALTH INSURANCE

## 2016-02-04 ENCOUNTER — Telehealth: Payer: Self-pay | Admitting: *Deleted

## 2016-02-04 ENCOUNTER — Ambulatory Visit (HOSPITAL_BASED_OUTPATIENT_CLINIC_OR_DEPARTMENT_OTHER): Payer: PRIVATE HEALTH INSURANCE | Admitting: Internal Medicine

## 2016-02-04 VITALS — BP 97/54 | HR 109 | Temp 97.9°F | Resp 23 | Ht 70.0 in | Wt 234.2 lb

## 2016-02-04 DIAGNOSIS — C7989 Secondary malignant neoplasm of other specified sites: Secondary | ICD-10-CM

## 2016-02-04 DIAGNOSIS — C7931 Secondary malignant neoplasm of brain: Secondary | ICD-10-CM

## 2016-02-04 DIAGNOSIS — R739 Hyperglycemia, unspecified: Secondary | ICD-10-CM

## 2016-02-04 DIAGNOSIS — D696 Thrombocytopenia, unspecified: Secondary | ICD-10-CM

## 2016-02-04 DIAGNOSIS — C3490 Malignant neoplasm of unspecified part of unspecified bronchus or lung: Secondary | ICD-10-CM

## 2016-02-04 DIAGNOSIS — C3411 Malignant neoplasm of upper lobe, right bronchus or lung: Secondary | ICD-10-CM

## 2016-02-04 DIAGNOSIS — Z510 Encounter for antineoplastic radiation therapy: Secondary | ICD-10-CM | POA: Diagnosis not present

## 2016-02-04 DIAGNOSIS — F411 Generalized anxiety disorder: Secondary | ICD-10-CM | POA: Diagnosis not present

## 2016-02-04 HISTORY — DX: Hyperglycemia, unspecified: R73.9

## 2016-02-04 LAB — ABO/RH: ABO/RH(D): O POS

## 2016-02-04 LAB — CBC WITH DIFFERENTIAL/PLATELET
BASO%: 0.2 % (ref 0.0–2.0)
Basophils Absolute: 0 10*3/uL (ref 0.0–0.1)
EOS ABS: 1 10*3/uL — AB (ref 0.0–0.5)
EOS%: 4.5 % (ref 0.0–7.0)
HEMATOCRIT: 44.1 % (ref 38.4–49.9)
HEMOGLOBIN: 13.8 g/dL (ref 13.0–17.1)
LYMPH#: 0.4 10*3/uL — AB (ref 0.9–3.3)
LYMPH%: 1.7 % — ABNORMAL LOW (ref 14.0–49.0)
MCH: 27.9 pg (ref 27.2–33.4)
MCHC: 31.4 g/dL — ABNORMAL LOW (ref 32.0–36.0)
MCV: 88.9 fL (ref 79.3–98.0)
MONO#: 0.8 10*3/uL (ref 0.1–0.9)
MONO%: 3.7 % (ref 0.0–14.0)
NEUT%: 89.9 % — ABNORMAL HIGH (ref 39.0–75.0)
NEUTROS ABS: 19.6 10*3/uL — AB (ref 1.5–6.5)
RBC: 4.97 10*6/uL (ref 4.20–5.82)
RDW: 15.5 % — AB (ref 11.0–14.6)
WBC: 21.8 10*3/uL — AB (ref 4.0–10.3)

## 2016-02-04 LAB — COMPREHENSIVE METABOLIC PANEL
ALBUMIN: 2 g/dL — AB (ref 3.5–5.0)
ALK PHOS: 90 U/L (ref 40–150)
ALT: 16 U/L (ref 0–55)
AST: 11 U/L (ref 5–34)
Anion Gap: 7 mEq/L (ref 3–11)
BILIRUBIN TOTAL: 0.72 mg/dL (ref 0.20–1.20)
BUN: 28.3 mg/dL — AB (ref 7.0–26.0)
CALCIUM: 7.7 mg/dL — AB (ref 8.4–10.4)
CHLORIDE: 95 meq/L — AB (ref 98–109)
CO2: 25 mEq/L (ref 22–29)
CREATININE: 0.9 mg/dL (ref 0.7–1.3)
EGFR: >90 mL/min/{1.73_m2} (ref >90)
Glucose: 523 mg/dl — ABNORMAL HIGH (ref 70–140)
Potassium: 5.3 mEq/L — ABNORMAL HIGH (ref 3.5–5.1)
Sodium: 128 mEq/L — ABNORMAL LOW (ref 136–145)
TOTAL PROTEIN: 5 g/dL — AB (ref 6.4–8.3)

## 2016-02-04 MED ORDER — INSULIN REGULAR HUMAN 100 UNIT/ML IJ SOLN
20.0000 [IU] | Freq: Once | INTRAMUSCULAR | Status: AC
  Administered 2016-02-04: 20 [IU] via SUBCUTANEOUS
  Filled 2016-02-04: qty 0.2

## 2016-02-04 NOTE — Progress Notes (Signed)
[place] [phone]   [phone]  OFFICE PROGRESS NOTE  [name], MD [place] [address]  DIAGNOSIS: Stage IV (T3, N3, M1b) non-small cell lung cancer, adenocarcinoma, pending further diagnostic studies diagnosed in April 2017 and presented with large right upper and middle lobe lung mass in addition to extensive mediastinal and supraclavicular lymphadenopathy as well as liver, bone, brain and soft tissue lesions. Molecular studies are still pending.  PRIOR THERAPY: Whole brain irradiation under the care of Dr. Lisbeth Renshaw completed on 02/03/2016  CURRENT THERAPY: Systemic chemotherapy with carboplatin for AUC of 5, Alimta 500 MG/M2 and Ketruda (pembrolizumab) 200 MG every 3 weeks. First dose 02/09/2016.  INTERVAL HISTORY: [name] 60 y.o. male returns to the clinic today for follow-up visit accompanied by wife and 2 daughters. The patient continues to have baseline shortness of breath and he is currently on oxygen. He continues to complain of increasing fatigue and weakness as well as some ecchymosis on the upper extremities. He completed a course of whole brain irradiation yesterday. He is currently on Decadron 8 mg by mouth daily and this will be tapered slowly over the next 2 weeks. He still complaining of increasing fatigue and weakness. He has no chest pain but has mild cough with no hemoptysis. He denied having any significant weight loss or night sweats. He has no nausea or vomiting. He is here today for evaluation and discussion of his treatment options.  MEDICAL HISTORY: Past Medical History  Diagnosis Date  . HTN (hypertension)     pt unaware of this  . Persistent atrial fibrillation ([place])     now SR after DCCV   . Obesity   . H/O hiatal hernia   . Moderate mitral regurgitation   . Hyperthyroidism     treated with tapazole  . Brain cancer ([place]) 01/14/16    multiple brain metastases  . Non-small  cell cancer of right lung ([place]) 01/11/2016  . Brain metastasis ([place]) 01/18/2016  . Shortness of breath dyspnea     using 2 L O2   . Anxiety     ALLERGIES:  has No Known Allergies.  MEDICATIONS:  Current Outpatient Prescriptions  Medication Sig Dispense Refill  . acetaminophen-codeine (TYLENOL #3) 300-30 MG tablet One every 4 hours as needed for cough 100 tablet 0  . ALPRAZolam (XANAX) 0.25 MG tablet Take 1 tablet (0.25 mg total) by mouth at bedtime as needed for anxiety. 30 tablet 0  . budesonide-formoterol (SYMBICORT) 80-4.5 MCG/ACT inhaler Take 2 puffs first thing in am and then another 2 puffs about 12 hours later.    Marland Kitchen dexamethasone (DECADRON) 4 MG tablet Take 1 tablet (4 mg total) by mouth 3 (three) times daily. 120 tablet 0  . methimazole (TAPAZOLE) 5 MG tablet Take 1 tablet (5 mg total) by mouth daily. 90 tablet 0  . metoprolol (LOPRESSOR) 50 MG tablet Take 0.5 tablets (25 mg total) by mouth 2 (two) times daily. 180 tablet 2  . omeprazole (PRILOSEC) 40 MG capsule Take 1 capsule (40 mg total) by mouth daily. 90 capsule 1  . OXYGEN Reported on 01/18/2016    . promethazine-codeine (PHENERGAN WITH CODEINE) 6.25-10 MG/5ML syrup Reported on 01/29/2016  0  . traMADol (ULTRAM) 50 MG tablet Take 1-2 tablets (50-100 mg total) by mouth every 6 (six) hours as needed. 40 tablet 0   No current facility-administered medications for this visit.    SURGICAL HISTORY:  Past  Surgical History  Procedure Laterality Date  . Lower back surgery      herniated nucleus pulposus left L5-S1; semihemilaminectomy and diskectomy with microdissection with microscope  . Right cornea repair      secondary to injury from a bungee cord  . Tonsillectomy    . Cardioversion N/A 05/08/2013    unsuccessful for persistent afib  . Tee without cardioversion N/A 06/24/2014    Procedure: TRANSESOPHAGEAL ECHOCARDIOGRAM (TEE);  Surgeon: Josue Hector, MD;  Location: [facility];  Service: Cardiovascular;  Laterality: N/A;  .  Cardioversion N/A 04/09/2015    Procedure: CARDIOVERSION;  Surgeon: Josue Hector, MD;  Location: [facility] ENDOSCOPY;  Service: Cardiovascular;  Laterality: N/A;  . Video bronchoscopy Bilateral 01/01/2016    Procedure: VIDEO BRONCHOSCOPY WITH FLUORO;  Surgeon: Tanda Rockers, MD;  Location: [facility] ENDOSCOPY;  Service: Cardiopulmonary;  Laterality: Bilateral;  . Thoracentesis Right 01/10/16& 01/13/16    01/09/14= x1 liter , 01/13/16=x 2 liters  . Vasectomy    . Chest tube insertion Right 01/22/2016    Procedure: INSERTION RIGHT PLEURAL DRAINAGE CATHETER WITH FLUORO AND ULTRASOUND DRAINAGE OF RIGHT PLEURAL EFFUSION;  Surgeon: Grace Isaac, MD;  Location: [facility];  Service: Thoracic;  Laterality: Right;    REVIEW OF SYSTEMS:  Constitutional: positive for fatigue Eyes: negative Ears, nose, mouth, throat, and face: negative Respiratory: positive for cough and dyspnea on exertion Cardiovascular: negative Gastrointestinal: negative Genitourinary:negative Integument/breast: negative Hematologic/lymphatic: negative Musculoskeletal:negative Neurological: negative Behavioral/Psych: negative Endocrine: negative Allergic/Immunologic: negative   PHYSICAL EXAMINATION: General appearance: alert, cooperative, fatigued and no distress Head: Normocephalic, without obvious abnormality, atraumatic Neck: no adenopathy, no JVD, supple, symmetrical, trachea midline and thyroid not enlarged, symmetric, no tenderness/mass/nodules Lymph nodes: Cervical, supraclavicular, and axillary nodes normal. Resp: diminished breath sounds RLL and dullness to percussion RLL Back: symmetric, no curvature. ROM normal. No CVA tenderness. Cardio: regular rate and rhythm, S1, S2 normal, no murmur, click, rub or gallop GI: soft, non-tender; bowel sounds normal; no masses,  no organomegaly Extremities: extremities normal, atraumatic, no cyanosis or edema Neurologic: Alert and oriented X 3, normal strength and tone. Normal symmetric reflexes.  Normal coordination and gait  ECOG PERFORMANCE STATUS: 1 - Symptomatic but completely ambulatory  Blood pressure 97/54, pulse 109, temperature 97.9 F (36.6 C), temperature source Oral, resp. rate 23, height '5\' 10"'$  (1.778 m), weight 234 lb 3.2 oz (106.232 kg), SpO2 93 %.  LABORATORY DATA: Lab Results  Component Value Date   WBC 21.8* 02/04/2016   HGB 13.8 02/04/2016   HCT 44.1 02/04/2016   MCV 88.9 02/04/2016   PLT 12 Platelet count confirmed by slide estimate* 02/04/2016      Chemistry      Component Value Date/Time   NA 137 01/22/2016 1058   NA 137 01/18/2016 1016   K 4.8 01/22/2016 1058   K 5.1 01/18/2016 1016   CL 101 01/22/2016 1058   CO2 25 01/22/2016 1058   CO2 27 01/18/2016 1016   BUN 24* 01/22/2016 1058   BUN 23.4 01/18/2016 1016   CREATININE 0.89 01/22/2016 1058   CREATININE 0.8 01/18/2016 1016   CREATININE 0.61 05/29/2014 1659      Component Value Date/Time   CALCIUM 8.2* 01/22/2016 1058   CALCIUM 8.2* 01/18/2016 1016   ALKPHOS 76 01/18/2016 1016   ALKPHOS 59 01/10/2016 0435   AST 19 01/18/2016 1016   AST 17 01/10/2016 0435   ALT 64* 01/18/2016 1016   ALT 17 01/10/2016 0435   BILITOT <0.30 01/18/2016 1016  BILITOT 0.7 01/10/2016 0435       RADIOGRAPHIC STUDIES: Dg Chest 1 View  01/10/2016  CLINICAL DATA:  Patient status post right thoracentesis. EXAM: CHEST 1 VIEW COMPARISON:  Chest radiograph 01/09/2016 FINDINGS: Monitoring leads overlie the patient. Stable cardiac and mediastinal contours with large right hilar mass. Interval decrease in size of small right pleural effusion. Grossly unchanged diffuse airspace opacities throughout the right hemi thorax. Minimal atelectasis left lung base. IMPRESSION: Interval decrease in size of now small right pleural effusion. Grossly unchanged diffuse bilateral airspace opacities throughout the right hemi thorax. Re- demonstrated large right pulmonary mass. Electronically Signed   By: Lovey Newcomer M.D.   On: 01/10/2016  14:07   Dg Chest 2 View  01/19/2016  CLINICAL DATA:  Right lung cancer, shortness of breath. EXAM: CHEST  2 VIEW COMPARISON:  Jan 13, 2016. FINDINGS: Stable cardiomediastinal silhouette. Continued presence of large right upper lobe mass. Stable moderate size right pleural effusion is noted. Left lung is clear. No pneumothorax is noted. Bony thorax is unremarkable. IMPRESSION: Stable large right upper lobe mass and moderate size right pleural effusion. Electronically Signed   By: Marijo Conception, M.D.   On: 01/19/2016 16:20   Dg Chest 2 View  01/13/2016  CLINICAL DATA:  Right pleural effusion EXAM: CHEST  2 VIEW COMPARISON:  01/11/2016 FINDINGS: Right pleural effusion is slightly larger. Right upper lobe mass is stable. No pneumothorax. Normal heart size. IMPRESSION: Right pleural effusion is larger.  Stable right lung mass. Electronically Signed   By: Marybelle Killings M.D.   On: 01/13/2016 14:30   Dg Chest 2 View  01/11/2016  CLINICAL DATA:  Evaluate pleural effusion EXAM: CHEST  2 VIEW COMPARISON:  01/10/2016 FINDINGS: Cardiomediastinal silhouette is stable. Large right upper lobe/ perihilar mass again noted. There is small right pleural effusion with right basilar atelectasis. Left lung is clear. No pulmonary edema. IMPRESSION: Large right upper lobe/ perihilar mass again noted. There is small right pleural effusion with right basilar atelectasis. Left lung is clear. Electronically Signed   By: Lahoma Crocker M.D.   On: 01/11/2016 09:25   Dg Chest 2 View  01/09/2016  CLINICAL DATA:  60 year old male with a history of shortness of breath EXAM: CHEST - 2 VIEW COMPARISON:  01/08/2016 FINDINGS: Cardiomediastinal silhouette likely unchanged with the right heart border obscured by overlying lung/pleural disease. Left lung remains relatively well aerated, with minimal interstitial opacities. No pneumothorax. Improved opacity on the right with improved aeration of the right upper lung. Pleural parenchymal thickening  with dense opacity at the right base persists. No displaced fracture. No displaced fracture. IMPRESSION: Improved aeration on the right, with persisting pleural parenchymal thickening and dense opacity at the right base compatible with combination of known tumor and pleural effusion. No pneumothorax. Left lung relatively well aerated. Signed, Dulcy Fanny. Earleen Newport, DO [facility] Electronically Signed   By: Corrie Mckusick D.O.   On: 01/09/2016 18:01   Dg Chest 2 View  01/08/2016  CLINICAL DATA:  Follow-up of thoracentesis; patient coughing, history of hypertension and stage IV right lung malignancy with metastases EXAM: CHEST  2 VIEW COMPARISON:  PET-CT study of today's date and CT scan of the chest of December 31, 2015 FINDINGS: The right hemi thorax is largely opacified. A small amount of aerated lung persists in the perihilar region. There is mild shift of the mediastinum toward the right. The left lung is well-expanded without focal infiltrate. The heart is  normal in size where visualized. The bony thorax exhibits no acute abnormality. IMPRESSION: Near-total opacification of the right hemithorax consistent with large pleural effusion as well as atelectasis and known right upper lobe mass. No pneumothorax is observed. The opacification has progressed markedly since 31 December 2015. The left lung is clear. Electronically Signed   By: David  Martinique M.D.   On: 01/08/2016 15:31   Mr Jeri Cos XI Contrast  01/14/2016  ADDENDUM REPORT: 01/14/2016 11:32 ADDENDUM: Study discussed by telephone with Dr. Curt Bears on 01/14/2016 At 1127 hours. Electronically Signed   By: Genevie Ann M.D.   On: 01/14/2016 11:32  01/14/2016  CLINICAL DATA:  60 year old male with recently diagnosed stage IV (T3, N3, M1b) non-small cell lung cancer. Staging. Subsequent encounter. EXAM: MRI HEAD WITHOUT AND WITH CONTRAST TECHNIQUE: Multiplanar, multiecho pulse sequences of the brain and  surrounding structures were obtained without and with intravenous contrast. CONTRAST:  20 mL MultiHance. COMPARISON:  PET-CT 01/08/2016 FINDINGS: Post-contrast images are degraded by motion artifact despite repeated imaging attempts. There are multiple subcentimeter enhancing lesions in the bilateral cerebral hemispheres most compatible with brain metastases. There is a solitary 16 mm rim enhancing metastasis in the anterior right frontal lobe with surrounding cerebral edema (series 12, image 27 and series 8, image 15). There is also confluent cerebral edema in the left anterior frontal lobe related to a 9 mm metastasis there are (series 8, image 17 and series 12, image 31. There is a solitary punctate metastasis suspected in the posterior superior left cerebellum. In all about 14 small metastases are identified. No dural thickening identified. However, there are also multiple small nonenhancing areas of restricted diffusion in both cerebral and cerebellar hemispheres (series 5). None of these however appears to be enhancing following contrast, such that these areas are most compatible with small scattered acute infarcts. The larger of these have T2 hyperintensity, including in the left splenium of the corpus callosum (series 7, image 16, seen to be nonenhancing on series 12, image 30). There is a superimposed small chronic left cerebellar lacunar infarct. No midline shift or loss of basilar cisterns. No acute intracranial hemorrhage identified. No ventriculomegaly. Major intracranial vascular flow voids are preserved. The left vertebral artery appears dominant. Negative pituitary and cervicomedullary junction. Grossly negative visualized cervical spinal cord. Mildly to moderately heterogeneous bone marrow signal throughout the visible skull and upper cervical spine. Of these, there is associated abnormal diffusion at the left occipital condyle compatible with bone metastasis and this was hypermetabolic on PET-CT. A  right side C2-C3 bone metastasis was better demonstrated by PET. Visible internal auditory structures appear normal. Mastoids are clear. Paranasal sinuses are clear. Negative orbit and scalp soft tissues. IMPRESSION: 1. Positive for multiple small brain metastases. Approximately fourteen enhancing metastases are identified ranging from punctate to 16 mm. There is associated cerebral edema, most pronounced in the anterior frontal lobes, without significant intracranial mass effect. 2. There also are numerous superimposed small nonenhancing areas of restricted diffusion in both cerebral and cerebellar hemispheres, but these are most compatible with small acute embolic infarcts. 3. Left occipital condyle skullbase metastasis. Cervical spine metastatic disease better demonstrated on recent PET-CT. Electronically Signed: By: Genevie Ann M.D. On: 01/14/2016 11:22   Nm Pet Image Initial (pi) Skull Base To Thigh  01/08/2016  CLINICAL DATA:  Initial treatment strategy for lung mass. EXAM: NUCLEAR MEDICINE PET SKULL BASE TO THIGH TECHNIQUE: 12.86 mCi F-18 FDG was injected intravenously. Full-ring PET imaging was performed from the skull  base to thigh after the radiotracer. CT data was obtained and used for attenuation correction and anatomic localization. FASTING BLOOD GLUCOSE:  Value: 126 mg/dl COMPARISON:  CT chest dated 12/31/2015 FINDINGS: NECK No hypermetabolic lymph nodes in the neck. Two dominant lesions in the left thyroid gland, mid representative max SUV 13.5, nonspecific. CHEST Right upper and middle lobe mass, max SUV 16.0, compatible with primary bronchogenic neoplasm. This was better evaluated on recent CT, when it measured approximately 11.9 x 8.0 x 11.4 cm. Associated near complete collapse of the right lung secondary to a large right pleural effusion, malignant, with multiple enhancing pleural-based nodules, representative max SUV 16.2. Left lung is grossly clear.  No pneumothorax. Widespread thoracic  lymphadenopathy, max SUV 16.6 in the subcarinal region. 9 mm short axis left supraclavicular nodes are present with max SUV 9.1. The heart is normal in size.  Trace pericardial fluid. ABDOMEN/PELVIS Motion degraded images. Three hepatic metastases, including a 2.1 cm lesion in segment 7 with max SUV 20.1 and a 1.5 cm lesion in segment 4A with max SUV 14.7. No abnormal hypermetabolic activity within the pancreas, adrenal glands, or spleen. No hypermetabolic lymph nodes in the abdomen or pelvis. SKELETON Innumerable intramuscular and subcutaneous soft tissue metastases. For example, representative max SUV 17.2 along the posterior left shoulder and representative max SUV 15.2 in the left gluteal region. Multifocal osseous metastases in the visualized axial and appendicular skeleton. For example: --Left clivus/skull base, max SUV 18.0 --Lower cervical spine, max SUV 6.0 --Left acromion, max SUV 8.2 --Lower thoracic spine, max SUV 5.4 --Left proximal femur, max SUV 15.0 IMPRESSION: Stage IV right lung cancer, with widespread nodal, pleural, hepatic, soft tissue, and osseous metastases, as above. Electronically Signed   By: Julian Hy M.D.   On: 01/08/2016 09:53   Dg Chest Port 1 View  01/22/2016  CLINICAL DATA:  Chest tube placement EXAM: PORTABLE CHEST 1 VIEW COMPARISON:  01/19/2016 chest radiograph. FINDINGS: Right PleurX drain is seen at the basilar right pleural space. Stable cardiomediastinal silhouette with normal heart size. No pneumothorax. Small residual right pleural effusion, not appreciably changed. No left pleural effusion. Stable large right upper lung parahilar mass. Stable mild patchy opacity at the right lung base. IMPRESSION: 1. Right PleurX drain in the basilar right pleural space. Stable small residual right pleural effusion. No pneumothorax. 2. Stable large right upper lung parahilar mass and mild patchy right lung base opacity. Electronically Signed   By: Ilona Sorrel M.D.   On: 01/22/2016  14:00   Dg C-arm 1-60 Min-no Report  01/22/2016  CLINICAL DATA: surgery C-ARM 1-60 MINUTES Fluoroscopy was utilized by the requesting physician.  No radiographic interpretation.   US Thoracentesis Asp Pleural Space W/img Guide  01/10/2016  INDICATION: Patient with recent bronchoscopy revealing atypical cells, dyspnea, recurrent right pleural effusion. Request made for diagnostic and therapeutic right thoracentesis. EXAM: ULTRASOUND GUIDED DIAGNOSTIC AND THERAPEUTIC RIGHT THORACENTESIS MEDICATIONS: None. COMPLICATIONS: None immediate. PROCEDURE: An ultrasound guided thoracentesis was thoroughly discussed with the patient and questions answered. The benefits, risks, alternatives and complications were also discussed. The patient understands and wishes to proceed with the procedure. Written consent was obtained. Ultrasound was performed to localize and mark an adequate pocket of fluid in the right chest. The area was then prepped and draped in the normal sterile fashion. 1% Lidocaine was used for local anesthesia. Under ultrasound guidance a Safe-T-Centesis catheter was introduced. Thoracentesis was performed. The catheter was removed and a dressing applied. FINDINGS: A total of approximately 2.3  liters of turbid, amber fluid was removed. Samples were sent to the laboratory as requested by the clinical team. Only the above amount of fluid was removed at this time secondary to persistent patient coughing . IMPRESSION: Successful ultrasound guided diagnostic and therapeutic right thoracentesis yielding 2.3 liters of pleural fluid. Read by: Rowe Robert, PA-C Electronically Signed   By: Aletta Edouard M.D.   On: 01/10/2016 12:57    ASSESSMENT AND PLAN: This is a very pleasant 60 years old white male with a stage IV non-small cell lung cancer, adenocarcinoma with extensive metastasis to the lung mediastinal and supraclavicular lymph node as well as liver, bone, brain and soft tissue lesions. This was diagnosed in  April 2017. The molecular studies are still pending.  He recently completed whole brain irradiation. I had a lengthy discussion with the patient and his family about his current disease status and treatment options. He was given the option of palliative care versus consideration of systemic therapy with the recently approved regimen of carboplatin for AUC of 5, Alimta 500 MG/M2 and Ketruda (pembrolizumab) 200 MG IV every 3 weeks. The patient is interested in proceeding with the treatment. Depending on his blood count on the day of the treatment the patient may proceed with a single agent therapy with Nat Math (pembrolizumab) until improvement of the platelets count followed by the addition of the systemic chemotherapy with carboplatin and Alimta. I discussed with the patient and his family the adverse effect of this treatment. I will also arrange for the patient to have a chemotherapy education class before starting the first dose of his treatment. The patient his family understand the risk of treatment with his current condition and the would like to proceed with the treatment as planned. For anxiety, I started the patient on Xanax 0.25 mg by mouth 3 times a day as needed. For the thrombocytopenia, I will arrange for the patient to receive 1 unit of platelets today or tomorrow. For the hyperglycemia, this is most likely secondary to his steroid treatment. We will give the patient regular insulin 20 units today. He would come back for follow-up visit in 2 weeks for evaluation and management of any adverse effect of his treatment. The patient voices understanding of current disease status and treatment options and is in agreement with the current care plan.  All questions were answered. The patient knows to call the clinic with any problems, questions or concerns. We can certainly see the patient much sooner if necessary.  I spent 20 minutes counseling the patient face to face. The total time spent in  the appointment was 30 minutes.  Disclaimer: This note was dictated with voice recognition software. Similar sounding words can inadvertently be transcribed and may not be corrected upon review.

## 2016-02-04 NOTE — Telephone Encounter (Signed)
Per staff message and POF I have scheduled appts. Advised scheduler of appts. JMW  

## 2016-02-04 NOTE — Telephone Encounter (Signed)
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 no availability @ cc or sickle for 6/1

## 2016-02-04 NOTE — Patient Instructions (Signed)
Hyperglycemia °Hyperglycemia occurs when the glucose (sugar) in your blood is too high. Hyperglycemia can happen for many reasons, but it most often happens to people who do not know they have diabetes or are not managing their diabetes properly.  °CAUSES  °Whether you have diabetes or not, there are other causes of hyperglycemia. Hyperglycemia can occur when you have diabetes, but it can also occur in other situations that you might not be as aware of, such as: °Diabetes °· If you have diabetes and are having problems controlling your blood glucose, hyperglycemia could occur because of some of the following reasons: °¨ Not following your meal plan. °¨ Not taking your diabetes medications or not taking it properly. °¨ Exercising less or doing less activity than you normally do. °¨ Being sick. °Pre-diabetes °· This cannot be ignored. Before people develop Type 2 diabetes, they almost always have "pre-diabetes." This is when your blood glucose levels are higher than normal, but not yet high enough to be diagnosed as diabetes. Research has shown that some long-term damage to the body, especially the heart and circulatory system, may already be occurring during pre-diabetes. If you take action to manage your blood glucose when you have pre-diabetes, you may delay or prevent Type 2 diabetes from developing. °Stress °· If you have diabetes, you may be "diet" controlled or on oral medications or insulin to control your diabetes. However, you may find that your blood glucose is higher than usual in the hospital whether you have diabetes or not. This is often referred to as "stress hyperglycemia." Stress can elevate your blood glucose. This happens because of hormones put out by the body during times of stress. If stress has been the cause of your high blood glucose, it can be followed regularly by your caregiver. That way he/she can make sure your hyperglycemia does not continue to get worse or progress to  diabetes. °Steroids °· Steroids are medications that act on the infection fighting system (immune system) to block inflammation or infection. One side effect can be a rise in blood glucose. Most people can produce enough extra insulin to allow for this rise, but for those who cannot, steroids make blood glucose levels go even higher. It is not unusual for steroid treatments to "uncover" diabetes that is developing. It is not always possible to determine if the hyperglycemia will go away after the steroids are stopped. A special blood test called an A1c is sometimes done to determine if your blood glucose was elevated before the steroids were started. °SYMPTOMS °· Thirsty. °· Frequent urination. °· Dry mouth. °· Blurred vision. °· Tired or fatigue. °· Weakness. °· Sleepy. °· Tingling in feet or leg. °DIAGNOSIS  °Diagnosis is made by monitoring blood glucose in one or all of the following ways: °· A1c test. This is a chemical found in your blood. °· Fingerstick blood glucose monitoring. °· Laboratory results. °TREATMENT  °First, knowing the cause of the hyperglycemia is important before the hyperglycemia can be treated. Treatment may include, but is not be limited to: °· Education. °· Change or adjustment in medications. °· Change or adjustment in meal plan. °· Treatment for an illness, infection, etc. °· More frequent blood glucose monitoring. °· Change in exercise plan. °· Decreasing or stopping steroids. °· Lifestyle changes. °HOME CARE INSTRUCTIONS  °· Test your blood glucose as directed. °· Exercise regularly. Your caregiver will give you instructions about exercise. Pre-diabetes or diabetes which comes on with stress is helped by exercising. °· Eat wholesome,   balanced meals. Eat often and at regular, fixed times. Your caregiver or nutritionist will give you a meal plan to guide your sugar intake. °· Being at an ideal weight is important. If needed, losing as little as 10 to 15 pounds may help improve blood  glucose levels. °SEEK MEDICAL CARE IF:  °· You have questions about medicine, activity, or diet. °· You continue to have symptoms (problems such as increased thirst, urination, or weight gain). °SEEK IMMEDIATE MEDICAL CARE IF:  °· You are vomiting or have diarrhea. °· Your breath smells fruity. °· You are breathing faster or slower. °· You are very sleepy or incoherent. °· You have numbness, tingling, or pain in your feet or hands. °· You have chest pain. °· Your symptoms get worse even though you have been following your caregiver's orders. °· If you have any other questions or concerns. °  °This information is not intended to replace advice given to you by your health care provider. Make sure you discuss any questions you have with your health care provider. °  °Document Released: 02/15/2001 Document Revised: 11/14/2011 Document Reviewed: 04/28/2015 °Elsevier Interactive Patient Education ©2016 Elsevier Inc. ° °

## 2016-02-04 NOTE — Progress Notes (Signed)
Oncology Nurse Navigator Documentation  Oncology Nurse Navigator Flowsheets 02/04/2016  Navigator Location [facility]  Navigator Encounter Type Clinic/MDC  Treatment Phase Treatment  Barriers/Navigation Needs Coordination of Care  Interventions Coordination of Care  Coordination of Care Other  Acuity Level 2  Acuity Level 2 Other  Time Spent with Patient 30   Per Dr. Julien Nordmann, I called pathology dept to inquire about foundation one and PDL 1 test.  I spoke with Steffanie Dunn and she will follow up and notify me.

## 2016-02-04 NOTE — Telephone Encounter (Signed)
spoke w/ wife confirmed 6/5 apt & 6/6 apt.. pt will look at [facility]

## 2016-02-05 ENCOUNTER — Other Ambulatory Visit: Payer: Self-pay | Admitting: Medical Oncology

## 2016-02-05 ENCOUNTER — Ambulatory Visit (HOSPITAL_COMMUNITY): Payer: PRIVATE HEALTH INSURANCE

## 2016-02-05 ENCOUNTER — Ambulatory Visit: Payer: PRIVATE HEALTH INSURANCE

## 2016-02-05 ENCOUNTER — Ambulatory Visit (HOSPITAL_COMMUNITY)
Admission: RE | Admit: 2016-02-05 | Discharge: 2016-02-05 | Disposition: A | Discharge status: Home / Self Care | Payer: PRIVATE HEALTH INSURANCE | Source: Ambulatory Visit | Attending: Internal Medicine | Admitting: Internal Medicine

## 2016-02-05 VITALS — BP 103/51 | HR 92 | Temp 97.7°F | Resp 16

## 2016-02-05 DIAGNOSIS — D696 Thrombocytopenia, unspecified: Secondary | ICD-10-CM

## 2016-02-05 MED ORDER — DIPHENHYDRAMINE HCL 25 MG PO CAPS
25.0000 mg | ORAL_CAPSULE | Freq: Once | ORAL | Status: AC
  Administered 2016-02-05: 25 mg via ORAL
  Filled 2016-02-05: qty 1

## 2016-02-05 MED ORDER — SODIUM CHLORIDE 0.9 % IV SOLN
250.0000 mL | Freq: Once | INTRAVENOUS | Status: AC
  Administered 2016-02-05: 250 mL via INTRAVENOUS

## 2016-02-05 MED ORDER — ACETAMINOPHEN 325 MG PO TABS
650.0000 mg | ORAL_TABLET | Freq: Once | ORAL | Status: AC
  Administered 2016-02-05: 650 mg via ORAL
  Filled 2016-02-05: qty 2

## 2016-02-05 NOTE — Progress Notes (Signed)
Diagnosis Association: Thrombocytopenia ([place]) (287.5)  Provider: Mayme Genta  Procedure: Infusion of platelets  Pt tolerated infusion well.  Post procedure: Pt alert, oriented and ambulatory to wheelchair. Pt uses oxygen at home and was on oxygen during the procedure.

## 2016-02-05 NOTE — Discharge Instructions (Signed)
Platelet Transfusion  A platelet transfusion is a procedure in which you receive donated platelets through an IV tube. Platelets are tiny pieces of blood cells. When a blood vessel is damaged, platelets collect in the damaged area to help form a blood clot. This begins the healing process. If your platelet count gets too low, your blood may have trouble clotting.  You may need a platelet transfusion if you have a condition that causes a low number of platelets (thrombocytopenia). A platelet transfusion may be used to stop or prevent bleeding.  LET [facility] CARE PROVIDER KNOW ABOUT:   Any allergies you have.   All medicines you are taking, including vitamins, herbs, eye drops, creams, and over-the-counter medicines.   Previous problems you or members of your family have had with the use of anesthetics.   Any blood disorders you have.   Previous surgeries you have had.   Any medical conditions you may have.   Any reactions you have had during a previous transfusion. RISKS AND COMPLICATIONS Generally, this is a safe procedure. However, problems may occur, including:   Fever with or without chills. The fever usually occurs within the first 4 hours of the transfusion and returns to normal within 48 hours.  Allergic reaction. The reaction is most commonly caused by antibodies your body creates against substances in the transfusion. Signs of an allergic reaction may include itching, hives, difficulty breathing, shock, or low blood pressure.  Sudden (acute) or delayed hemolytic reaction. This rare reaction can occur during the transfusion and up to 28 days after the transfusion. The reaction usually occurs when your body's defense system (immune system) attacks the new platelets. Signs of a hemolytic reaction may include fever, headache, difficulty breathing, low blood pressure, a rapid heartbeat, or pain in your back, abdomen, chest, or IV site.  Transfusion-related acute lung injury  (TRALI). TRALI can occur within hours of a transfusion, or several days later. This is a rare reaction that causes lung damage. The cause is not known.  Infection. Signs of this rare complication may include fever, chills, vomiting, a rapid heartbeat, or low blood pressure. BEFORE THE PROCEDURE   You may have a blood test to determine your blood type. This is necessary to find out what kind ofplatelets best matches your platelets.  If you have had an allergic reaction to a transfusion in the past, you may be given medicine to help prevent a reaction. Take this medicine only as directed by your health care provider.  Your temperature, blood pressure, and pulse will be monitored before the transfusion. PROCEDURE  An IV will be started in your hand or arm.  The transfusion will be attached to your IV tubing. The bag of donated platelets will be attached to your IV tube andgiven into your vein.  Your temperature, blood pressure, and pulse will be monitored regularly during the transfusion. This monitoring is done to help detect early signs of a transfusion reaction.  If you have any signs or symptoms of a reaction, your transfusion will be stopped and you may be given medicine.  When your transfusion is complete, your IV will be removed.  Pressure may be applied to the IV site for a few minutes.  A bandage (dressing) will be applied. The procedure may vary among health care providers and hospitals. AFTER THE PROCEDURE  Your blood pressure, temperature, and pulse will be monitored regularly.   This information is not intended to replace advice given to you by your health  care provider. Make sure you discuss any questions you have with your health care provider.   Document Released: 06/19/2007 Document Revised: 09/12/2014 Document Reviewed: 07/02/2014 Elsevier Interactive Patient Education [employer].

## 2016-02-05 NOTE — Progress Notes (Unsigned)
Duplicate Platelet orders entered  in error-orders discontinued

## 2016-02-08 ENCOUNTER — Encounter (HOSPITAL_COMMUNITY): Payer: Self-pay | Admitting: Emergency Medicine

## 2016-02-08 ENCOUNTER — Ambulatory Visit: Payer: PRIVATE HEALTH INSURANCE | Admitting: Cardiovascular Disease

## 2016-02-08 ENCOUNTER — Emergency Department (HOSPITAL_COMMUNITY): Payer: PRIVATE HEALTH INSURANCE

## 2016-02-08 ENCOUNTER — Other Ambulatory Visit (HOSPITAL_COMMUNITY): Payer: Self-pay

## 2016-02-08 ENCOUNTER — Other Ambulatory Visit: Payer: Self-pay

## 2016-02-08 ENCOUNTER — Other Ambulatory Visit: Payer: PRIVATE HEALTH INSURANCE

## 2016-02-08 ENCOUNTER — Inpatient Hospital Stay (HOSPITAL_COMMUNITY)
Admission: EM | Admit: 2016-02-08 | Discharge: 2016-02-10 | DRG: 813 | Disposition: A | Discharge status: Hospice | Payer: PRIVATE HEALTH INSURANCE | Attending: Internal Medicine | Admitting: Internal Medicine

## 2016-02-08 ENCOUNTER — Ambulatory Visit: Payer: PRIVATE HEALTH INSURANCE

## 2016-02-08 ENCOUNTER — Telehealth: Payer: Self-pay | Admitting: *Deleted

## 2016-02-08 ENCOUNTER — Other Ambulatory Visit: Payer: Self-pay | Admitting: Medical Oncology

## 2016-02-08 DIAGNOSIS — R188 Other ascites: Secondary | ICD-10-CM | POA: Diagnosis present

## 2016-02-08 DIAGNOSIS — Z66 Do not resuscitate: Secondary | ICD-10-CM | POA: Diagnosis present

## 2016-02-08 DIAGNOSIS — D6959 Other secondary thrombocytopenia: Secondary | ICD-10-CM | POA: Diagnosis not present

## 2016-02-08 DIAGNOSIS — I10 Essential (primary) hypertension: Secondary | ICD-10-CM | POA: Diagnosis present

## 2016-02-08 DIAGNOSIS — C7989 Secondary malignant neoplasm of other specified sites: Secondary | ICD-10-CM | POA: Diagnosis present

## 2016-02-08 DIAGNOSIS — E0500 Thyrotoxicosis with diffuse goiter without thyrotoxic crisis or storm: Secondary | ICD-10-CM | POA: Diagnosis present

## 2016-02-08 DIAGNOSIS — C7931 Secondary malignant neoplasm of brain: Secondary | ICD-10-CM | POA: Diagnosis present

## 2016-02-08 DIAGNOSIS — I951 Orthostatic hypotension: Secondary | ICD-10-CM | POA: Diagnosis present

## 2016-02-08 DIAGNOSIS — I4891 Unspecified atrial fibrillation: Secondary | ICD-10-CM

## 2016-02-08 DIAGNOSIS — F419 Anxiety disorder, unspecified: Secondary | ICD-10-CM | POA: Diagnosis present

## 2016-02-08 DIAGNOSIS — E669 Obesity, unspecified: Secondary | ICD-10-CM | POA: Diagnosis present

## 2016-02-08 DIAGNOSIS — Z515 Encounter for palliative care: Secondary | ICD-10-CM | POA: Diagnosis present

## 2016-02-08 DIAGNOSIS — Z801 Family history of malignant neoplasm of trachea, bronchus and lung: Secondary | ICD-10-CM

## 2016-02-08 DIAGNOSIS — R630 Anorexia: Secondary | ICD-10-CM | POA: Diagnosis present

## 2016-02-08 DIAGNOSIS — K5900 Constipation, unspecified: Secondary | ICD-10-CM | POA: Diagnosis present

## 2016-02-08 DIAGNOSIS — Z87891 Personal history of nicotine dependence: Secondary | ICD-10-CM

## 2016-02-08 DIAGNOSIS — C7951 Secondary malignant neoplasm of bone: Secondary | ICD-10-CM | POA: Diagnosis present

## 2016-02-08 DIAGNOSIS — J90 Pleural effusion, not elsewhere classified: Secondary | ICD-10-CM | POA: Diagnosis present

## 2016-02-08 DIAGNOSIS — D696 Thrombocytopenia, unspecified: Secondary | ICD-10-CM

## 2016-02-08 DIAGNOSIS — G8918 Other acute postprocedural pain: Secondary | ICD-10-CM

## 2016-02-08 DIAGNOSIS — R531 Weakness: Secondary | ICD-10-CM | POA: Insufficient documentation

## 2016-02-08 DIAGNOSIS — E872 Acidosis, unspecified: Secondary | ICD-10-CM | POA: Insufficient documentation

## 2016-02-08 DIAGNOSIS — J910 Malignant pleural effusion: Secondary | ICD-10-CM | POA: Diagnosis present

## 2016-02-08 DIAGNOSIS — Z8041 Family history of malignant neoplasm of ovary: Secondary | ICD-10-CM

## 2016-02-08 DIAGNOSIS — Z800 Family history of malignant neoplasm of digestive organs: Secondary | ICD-10-CM

## 2016-02-08 DIAGNOSIS — R040 Epistaxis: Secondary | ICD-10-CM | POA: Diagnosis present

## 2016-02-08 DIAGNOSIS — C787 Secondary malignant neoplasm of liver and intrahepatic bile duct: Secondary | ICD-10-CM | POA: Diagnosis present

## 2016-02-08 DIAGNOSIS — I340 Nonrheumatic mitral (valve) insufficiency: Secondary | ICD-10-CM | POA: Diagnosis present

## 2016-02-08 DIAGNOSIS — Z79899 Other long term (current) drug therapy: Secondary | ICD-10-CM

## 2016-02-08 DIAGNOSIS — T859XXA Unspecified complication of internal prosthetic device, implant and graft, initial encounter: Secondary | ICD-10-CM | POA: Diagnosis present

## 2016-02-08 DIAGNOSIS — C3411 Malignant neoplasm of upper lobe, right bronchus or lung: Secondary | ICD-10-CM | POA: Diagnosis present

## 2016-02-08 DIAGNOSIS — D688 Other specified coagulation defects: Secondary | ICD-10-CM | POA: Diagnosis present

## 2016-02-08 DIAGNOSIS — Z6834 Body mass index (BMI) 34.0-34.9, adult: Secondary | ICD-10-CM

## 2016-02-08 DIAGNOSIS — I480 Paroxysmal atrial fibrillation: Secondary | ICD-10-CM | POA: Insufficient documentation

## 2016-02-08 DIAGNOSIS — K625 Hemorrhage of anus and rectum: Secondary | ICD-10-CM | POA: Diagnosis present

## 2016-02-08 DIAGNOSIS — Z833 Family history of diabetes mellitus: Secondary | ICD-10-CM

## 2016-02-08 LAB — PREPARE PLATELET PHERESIS: UNIT DIVISION: 0

## 2016-02-08 LAB — COMPREHENSIVE METABOLIC PANEL
ALK PHOS: 69 U/L (ref 38–126)
ALT: 22 U/L (ref 17–63)
ANION GAP: 9 (ref 5–15)
AST: 20 U/L (ref 15–41)
Albumin: 2 g/dL — ABNORMAL LOW (ref 3.5–5.0)
BUN: 46 mg/dL — ABNORMAL HIGH (ref 6–20)
CALCIUM: 7.5 mg/dL — AB (ref 8.9–10.3)
CO2: 21 mmol/L — AB (ref 22–32)
Chloride: 100 mmol/L — ABNORMAL LOW (ref 101–111)
Creatinine, Ser: 1.09 mg/dL (ref 0.61–1.24)
GFR calc non Af Amer: >60 mL/min (ref >60)
Glucose, Bld: 323 mg/dL — ABNORMAL HIGH (ref 65–99)
POTASSIUM: 5.6 mmol/L — AB (ref 3.5–5.1)
SODIUM: 130 mmol/L — AB (ref 135–145)
Total Bilirubin: 1.1 mg/dL (ref 0.3–1.2)
Total Protein: 4.9 g/dL — ABNORMAL LOW (ref 6.5–8.1)

## 2016-02-08 LAB — CBC WITH DIFFERENTIAL/PLATELET
Basophils Absolute: 0 10*3/uL (ref 0.0–0.1)
Basophils Relative: 0 %
Eosinophils Absolute: 0.2 10*3/uL (ref 0.0–0.7)
Eosinophils Relative: 2 %
HCT: 38.5 % — ABNORMAL LOW (ref 39.0–52.0)
Hemoglobin: 13.2 g/dL (ref 13.0–17.0)
Lymphocytes Relative: 4 %
Lymphs Abs: 0.5 10*3/uL — ABNORMAL LOW (ref 0.7–4.0)
MCH: 29.1 pg (ref 26.0–34.0)
MCHC: 34.3 g/dL (ref 30.0–36.0)
MCV: 85 fL (ref 78.0–100.0)
Monocytes Absolute: 1.4 10*3/uL — ABNORMAL HIGH (ref 0.1–1.0)
Monocytes Relative: 11 %
Neutro Abs: 10.3 10*3/uL — ABNORMAL HIGH (ref 1.7–7.7)
Neutrophils Relative %: 83 %
Platelets: 6 10*3/uL — CL (ref 150–400)
RBC: 4.53 MIL/uL (ref 4.22–5.81)
RDW: 15.3 % (ref 11.5–15.5)
WBC: 12.4 10*3/uL — ABNORMAL HIGH (ref 4.0–10.5)

## 2016-02-08 LAB — I-STAT CG4 LACTIC ACID, ED: LACTIC ACID, VENOUS: 5.12 mmol/L — AB (ref 0.5–2.0)

## 2016-02-08 LAB — PROTIME-INR
INR: 1.55 — AB (ref 0.00–1.49)
Prothrombin Time: 18.1 seconds — ABNORMAL HIGH (ref 11.6–15.2)

## 2016-02-08 LAB — LIPASE, BLOOD: Lipase: 19 U/L (ref 11–51)

## 2016-02-08 MED ORDER — SODIUM CHLORIDE 0.9 % IV BOLUS (SEPSIS)
1000.0000 mL | Freq: Once | INTRAVENOUS | Status: AC
  Administered 2016-02-08: 1000 mL via INTRAVENOUS

## 2016-02-08 MED ORDER — SODIUM CHLORIDE 0.9 % IV SOLN: 10.0000 mL/h | Freq: Once | INTRAVENOUS | Status: DC

## 2016-02-08 NOTE — ED Notes (Signed)
MD at bedside. 

## 2016-02-08 NOTE — Telephone Encounter (Signed)
Patient's daughter called for the wife. She asked "does my dad have to be at the class or can my mom and I come. He is just to tired and not feeling well to come."  I told her that it would be good if he can come, but ok if they come for him. Also told her to give him the information. Message forwarded to the chemo ed nurse.

## 2016-02-08 NOTE — ED Provider Notes (Signed)
CSN: [id number]     Arrival date & time 02/08/16  2115 History   First MD Initiated Contact with Patient 02/08/16 2127     Chief Complaint  Patient presents with  . Lung Cancer  . Rectal Bleeding    HPI Patient with metastatic non-small cell lung cancer presents with concern for weakness, epistaxis, rectal bleeding, fatigue. Patient repleted radiation therapy last week, scheduled to begin immunotherapy and chemotherapy tomorrow. Patient has known metastatic disease throughout his habitus, including to the brain. Over the past days patient has had persistent weakness, anorexia, but no vomiting. He has also had occasional episodes of mild epistaxis, and today noticed blood in his stool. No abdominal pain. Patient received platelet transfusion 4 days ago.    Past Medical History  Diagnosis Date  . HTN (hypertension)     pt unaware of this  . Persistent atrial fibrillation ([place])     now SR after DCCV   . Obesity   . H/O hiatal hernia   . Moderate mitral regurgitation   . Hyperthyroidism     treated with tapazole  . Brain cancer ([place]) 01/14/16    multiple brain metastases  . Non-small cell cancer of right lung ([place]) 01/11/2016  . Brain metastasis ([place]) 01/18/2016  . Shortness of breath dyspnea     using 2 L O2   . Anxiety   . Hyperglycemia 02/04/2016   Past Surgical History  Procedure Laterality Date  . Lower back surgery      herniated nucleus pulposus left L5-S1; semihemilaminectomy and diskectomy with microdissection with microscope  . Right cornea repair      secondary to injury from a bungee cord  . Tonsillectomy    . Cardioversion N/A 05/08/2013    unsuccessful for persistent afib  . Tee without cardioversion N/A 06/24/2014    Procedure: TRANSESOPHAGEAL ECHOCARDIOGRAM (TEE);  Surgeon: Josue Hector, MD;  Location: [facility];  Service: Cardiovascular;  Laterality: N/A;  . Cardioversion N/A 04/09/2015    Procedure: CARDIOVERSION;  Surgeon: Josue Hector, MD;  Location: [facility]  ENDOSCOPY;  Service: Cardiovascular;  Laterality: N/A;  . Video bronchoscopy Bilateral 01/01/2016    Procedure: VIDEO BRONCHOSCOPY WITH FLUORO;  Surgeon: Tanda Rockers, MD;  Location: [facility] ENDOSCOPY;  Service: Cardiopulmonary;  Laterality: Bilateral;  . Thoracentesis Right 01/10/16& 01/13/16    01/09/14= x1 liter , 01/13/16=x 2 liters  . Vasectomy    . Chest tube insertion Right 01/22/2016    Procedure: INSERTION RIGHT PLEURAL DRAINAGE CATHETER WITH FLUORO AND ULTRASOUND DRAINAGE OF RIGHT PLEURAL EFFUSION;  Surgeon: Grace Isaac, MD;  Location: [facility];  Service: Thoracic;  Laterality: Right;   Family History  Problem Relation Age of Onset  . Diabetes Brother   . Hyperlipidemia Mother   . Pancreatic cancer Father 75  . Thyroid disease Mother   . Lung cancer Sister   . Ovarian cancer Sister     half sister   Social History  Substance Use Topics  . Smoking status: Former Smoker -- 1.50 packs/day for 27 years    Types: Cigarettes    Quit date: 09/05/2000  . Smokeless tobacco: Former Systems developer    Quit date: 05/08/2002  . Alcohol Use: No    Review of Systems  Constitutional:       Per HPI, otherwise negative  HENT:       Per HPI, otherwise negative  Respiratory:       Per HPI, otherwise negative  Cardiovascular:  Per HPI, otherwise negative  Gastrointestinal: Positive for nausea. Negative for vomiting.  Endocrine:       Negative aside from HPI  Genitourinary:       Neg aside from HPI   Musculoskeletal:       Per HPI, otherwise negative  Skin: Positive for color change and pallor.  Allergic/Immunologic: Positive for immunocompromised state.  Neurological: Positive for weakness. Negative for syncope.      Allergies  Review of patient's allergies indicates no known allergies.  Home Medications   Prior to Admission medications   Medication Sig Start Date End Date Taking? Authorizing Provider  acetaminophen-codeine (TYLENOL #3) 300-30 MG tablet One every 4 hours as needed  for cough 01/13/16  Yes Tanda Rockers, MD  ALPRAZolam Duanne Moron) 0.25 MG tablet Take 1 tablet (0.25 mg total) by mouth at bedtime as needed for anxiety. 01/18/16  Yes Curt Bears, MD  budesonide-formoterol [facility]) 80-4.5 MCG/ACT inhaler Take 2 puffs first thing in am and then another 2 puffs about 12 hours later. 12/30/15  Yes Tanda Rockers, MD  dexamethasone (DECADRON) 4 MG tablet Take 1 tablet (4 mg total) by mouth 3 (three) times daily. 01/26/16  Yes Hayden Pedro, PA-C  methimazole (TAPAZOLE) 5 MG tablet Take 1 tablet (5 mg total) by mouth daily. 09/06/15  Yes Renato Shin, MD  metoprolol (LOPRESSOR) 50 MG tablet Take 0.5 tablets (25 mg total) by mouth 2 (two) times daily. Patient taking differently: Take 50 mg by mouth daily.  04/09/15  Yes Josue Hector, MD  omeprazole (PRILOSEC) 40 MG capsule Take 1 capsule (40 mg total) by mouth daily. 01/13/16  Yes Tanda Rockers, MD  promethazine-codeine [facility] WITH CODEINE) 6.25-10 MG/5ML syrup Take 5 mLs by mouth every 6 (six) hours as needed for cough. Reported on 02/04/2016 12/28/15  Yes Historical Provider, MD  OXYGEN Reported on 01/18/2016    Historical Provider, MD  traMADol (ULTRAM) 50 MG tablet Take 1-2 tablets (50-100 mg total) by mouth every 6 (six) hours as needed. 01/22/16   Ivin Poot, MD   BP 83/64 mmHg  Pulse 51  Temp(Src) 97.8 F (36.6 C) (Oral)  Resp 25  SpO2 90% Physical Exam  Constitutional: He is oriented to person, place, and time.  Ill-appearing pale obese male  HENT:  Head: Normocephalic and atraumatic.  Eyes: Conjunctivae and EOM are normal.  Cardiovascular: Normal rate and regular rhythm.   Pulmonary/Chest: Effort normal. No stridor. No respiratory distress.  Right Pleurx catheter in place no surrounding erythema or drainage  Abdominal: He exhibits no distension. There is no tenderness.  Protuberant abdomen, no distention, guarding  Musculoskeletal: He exhibits no edema.  Neurological: He is alert and  oriented to person, place, and time. No cranial nerve deficit. Coordination normal.  Skin: Skin is warm and dry.  Psychiatric: He has a normal mood and affect.  Nursing note and vitals reviewed.   ED Course  Procedures (including critical care time) Labs Review Labs Reviewed  COMPREHENSIVE METABOLIC PANEL - Abnormal; Notable for the following:    Sodium 130 (*)    Potassium 5.6 (*)    Chloride 100 (*)    CO2 21 (*)    Glucose, Bld 323 (*)    BUN 46 (*)    Calcium 7.5 (*)    Total Protein 4.9 (*)    Albumin 2.0 (*)    All other components within normal limits  CBC WITH DIFFERENTIAL/PLATELET - Abnormal; Notable for the following:  WBC 12.4 (*)    HCT 38.5 (*)    Platelets 6 (*)    Neutro Abs 10.3 (*)    Lymphs Abs 0.5 (*)    Monocytes Absolute 1.4 (*)    All other components within normal limits  PROTIME-INR - Abnormal; Notable for the following:    Prothrombin Time 18.1 (*)    INR 1.55 (*)    All other components within normal limits  I-STAT CG4 LACTIC ACID, ED - Abnormal; Notable for the following:    Lactic Acid, Venous 5.12 (*)    All other components within normal limits  CULTURE, BLOOD (ROUTINE X 2)  CULTURE, BLOOD (ROUTINE X 2)  LIPASE, BLOOD  URINALYSIS, ROUTINE W REFLEX MICROSCOPIC (NOT AT [facility])  I-STAT CG4 LACTIC ACID, ED  PREPARE PLATELET PHERESIS  TYPE AND SCREEN      Imaging Review Dg Chest Port 1 View  02/08/2016  CLINICAL DATA:  Weakness.  Lung cancer EXAM: PORTABLE CHEST 1 VIEW COMPARISON:  01/22/2016 FINDINGS: Large right upper lobe malignancy with mediastinal adenopathy. Small right pleural effusion. Tunneled pleural catheter overlapping the right base. Interstitial coarsening on the right which could be postobstructive or lymphangitic tumor. Left lung is clear. IMPRESSION: Stable from 01/22/2016. Right upper lobe malignancy with mediastinal adenopathy. Tunneled pleural catheter with small right effusion. Electronically Signed   By: Monte Fantasia  M.D.   On: 02/08/2016 22:05   I have personally reviewed and evaluated these images and lab results as part of my medical decision-making.   Initial labs notable for platelets, 6000, down from 12,00 4 days ago, following which the patient received platelet transfusion.  Patient will have type and cross, received platelet transfusion.     12:37 AM Initial labs notable for lactic acidosis 5.121 patient has been receiving empiric fluid.  Blood pressure remains unremarkable, no fever. EKG with atrial fibrillation, rate 138, abnormal This is new compared to the patient's rhythm on arrival.   I discussed patient's case with our critical care team and hospitalist team. Patient will be admitted to stepdown unit.    MDM   Final diagnoses:  Thrombocytopenia (HCC)  Paroxysmal atrial fibrillation (HCC)  Weakness  Patient with metastatic non-small cell lung cancer presents with concern of weakness, mild epistaxis, and possible rectal bleeding. Here he is awake, alert, though ill-appearing. Patient is intermittently tachycardic with atrial fibrillation, persistently hypotensive. Notably, the patient has lactic acidosis consistent with hypermetabolic state, no obvious source of infection, and he is afebrile.  Patient's thrombocytopenia addressed with platelet transfusion.  Patient required admission to the stepdown unit for further evaluation and management.  CRITICAL CARE Performed by: Carmin Muskrat Total critical care time: 45 minutes Critical care time was exclusive of separately billable procedures and treating other patients. Critical care was necessary to treat or prevent imminent or life-threatening deterioration. Critical care was time spent personally by me on the following activities: development of treatment plan with patient and/or surrogate as well as nursing, discussions with consultants, evaluation of patient's response to treatment, examination of patient, obtaining  history from patient or surrogate, ordering and performing treatments and interventions, ordering and review of laboratory studies, ordering and review of radiographic studies, pulse oximetry and re-evaluation of patient's condition.    Carmin Muskrat, MD 02/09/16 0040

## 2016-02-08 NOTE — ED Notes (Signed)
Informed Dr. Vanita Panda about patient being in A-fib and his HR is in the 140's. He requested an EKG.

## 2016-02-08 NOTE — ED Notes (Signed)
Pt has been having an intermittent nose bleed x 1 week and started having deep red blood in his pleurex drain today along with rectal bleeding. Hypotensive. Platelets were down last Thursday. Radiation last Thursday. Starts chemo next week. Alert and oriented.

## 2016-02-08 NOTE — ED Notes (Signed)
Lactic Acid given to MD.

## 2016-02-09 ENCOUNTER — Other Ambulatory Visit: Payer: PRIVATE HEALTH INSURANCE

## 2016-02-09 ENCOUNTER — Ambulatory Visit: Payer: PRIVATE HEALTH INSURANCE

## 2016-02-09 ENCOUNTER — Inpatient Hospital Stay (HOSPITAL_COMMUNITY): Payer: PRIVATE HEALTH INSURANCE

## 2016-02-09 ENCOUNTER — Encounter (HOSPITAL_COMMUNITY): Payer: Self-pay | Admitting: Internal Medicine

## 2016-02-09 DIAGNOSIS — Z515 Encounter for palliative care: Secondary | ICD-10-CM | POA: Diagnosis not present

## 2016-02-09 DIAGNOSIS — E872 Acidosis, unspecified: Secondary | ICD-10-CM | POA: Insufficient documentation

## 2016-02-09 DIAGNOSIS — C3411 Malignant neoplasm of upper lobe, right bronchus or lung: Secondary | ICD-10-CM | POA: Diagnosis not present

## 2016-02-09 DIAGNOSIS — C787 Secondary malignant neoplasm of liver and intrahepatic bile duct: Secondary | ICD-10-CM | POA: Diagnosis present

## 2016-02-09 DIAGNOSIS — I4891 Unspecified atrial fibrillation: Secondary | ICD-10-CM

## 2016-02-09 DIAGNOSIS — E0500 Thyrotoxicosis with diffuse goiter without thyrotoxic crisis or storm: Secondary | ICD-10-CM | POA: Diagnosis not present

## 2016-02-09 DIAGNOSIS — D688 Other specified coagulation defects: Secondary | ICD-10-CM | POA: Diagnosis present

## 2016-02-09 DIAGNOSIS — R040 Epistaxis: Secondary | ICD-10-CM | POA: Diagnosis not present

## 2016-02-09 DIAGNOSIS — I951 Orthostatic hypotension: Secondary | ICD-10-CM | POA: Diagnosis present

## 2016-02-09 DIAGNOSIS — Z7189 Other specified counseling: Secondary | ICD-10-CM

## 2016-02-09 DIAGNOSIS — Z87891 Personal history of nicotine dependence: Secondary | ICD-10-CM | POA: Diagnosis not present

## 2016-02-09 DIAGNOSIS — C7931 Secondary malignant neoplasm of brain: Secondary | ICD-10-CM | POA: Diagnosis present

## 2016-02-09 DIAGNOSIS — D696 Thrombocytopenia, unspecified: Secondary | ICD-10-CM | POA: Diagnosis not present

## 2016-02-09 DIAGNOSIS — I480 Paroxysmal atrial fibrillation: Secondary | ICD-10-CM | POA: Insufficient documentation

## 2016-02-09 DIAGNOSIS — J910 Malignant pleural effusion: Secondary | ICD-10-CM | POA: Diagnosis present

## 2016-02-09 DIAGNOSIS — I340 Nonrheumatic mitral (valve) insufficiency: Secondary | ICD-10-CM | POA: Diagnosis present

## 2016-02-09 DIAGNOSIS — I10 Essential (primary) hypertension: Secondary | ICD-10-CM | POA: Diagnosis present

## 2016-02-09 DIAGNOSIS — C7951 Secondary malignant neoplasm of bone: Secondary | ICD-10-CM | POA: Diagnosis present

## 2016-02-09 DIAGNOSIS — Z833 Family history of diabetes mellitus: Secondary | ICD-10-CM | POA: Diagnosis not present

## 2016-02-09 DIAGNOSIS — Z66 Do not resuscitate: Secondary | ICD-10-CM | POA: Diagnosis present

## 2016-02-09 DIAGNOSIS — E669 Obesity, unspecified: Secondary | ICD-10-CM | POA: Diagnosis present

## 2016-02-09 DIAGNOSIS — K625 Hemorrhage of anus and rectum: Secondary | ICD-10-CM

## 2016-02-09 DIAGNOSIS — R531 Weakness: Secondary | ICD-10-CM | POA: Insufficient documentation

## 2016-02-09 DIAGNOSIS — J948 Other specified pleural conditions: Secondary | ICD-10-CM

## 2016-02-09 DIAGNOSIS — D6959 Other secondary thrombocytopenia: Secondary | ICD-10-CM | POA: Diagnosis present

## 2016-02-09 DIAGNOSIS — C7989 Secondary malignant neoplasm of other specified sites: Secondary | ICD-10-CM | POA: Diagnosis present

## 2016-02-09 DIAGNOSIS — R188 Other ascites: Secondary | ICD-10-CM | POA: Diagnosis present

## 2016-02-09 DIAGNOSIS — K5900 Constipation, unspecified: Secondary | ICD-10-CM | POA: Diagnosis present

## 2016-02-09 DIAGNOSIS — F419 Anxiety disorder, unspecified: Secondary | ICD-10-CM | POA: Diagnosis present

## 2016-02-09 LAB — BASIC METABOLIC PANEL
Anion gap: 8 (ref 5–15)
BUN: 44 mg/dL — AB (ref 6–20)
CALCIUM: 7 mg/dL — AB (ref 8.9–10.3)
CO2: 21 mmol/L — ABNORMAL LOW (ref 22–32)
Chloride: 106 mmol/L (ref 101–111)
Creatinine, Ser: 0.79 mg/dL (ref 0.61–1.24)
GFR calc Af Amer: >60 mL/min (ref >60)
GLUCOSE: 196 mg/dL — AB (ref 65–99)
Potassium: 4.8 mmol/L (ref 3.5–5.1)
Sodium: 135 mmol/L (ref 135–145)

## 2016-02-09 LAB — URINALYSIS, ROUTINE W REFLEX MICROSCOPIC
GLUCOSE, UA: 100 mg/dL — AB
Hgb urine dipstick: NEGATIVE
KETONES UR: NEGATIVE mg/dL
LEUKOCYTES UA: NEGATIVE
Nitrite: NEGATIVE
PROTEIN: NEGATIVE mg/dL
Specific Gravity, Urine: 1.028 (ref 1.005–1.030)
pH: 5.5 (ref 5.0–8.0)

## 2016-02-09 LAB — GLUCOSE, CAPILLARY
GLUCOSE-CAPILLARY: 199 mg/dL — AB (ref 65–99)
GLUCOSE-CAPILLARY: 190 mg/dL — AB (ref 65–99)
GLUCOSE-CAPILLARY: 213 mg/dL — AB (ref 65–99)
GLUCOSE-CAPILLARY: 214 mg/dL — AB (ref 65–99)

## 2016-02-09 LAB — CBC
HEMATOCRIT: 36.3 % — AB (ref 39.0–52.0)
Hemoglobin: 11.9 g/dL — ABNORMAL LOW (ref 13.0–17.0)
MCH: 28.7 pg (ref 26.0–34.0)
MCHC: 32.8 g/dL (ref 30.0–36.0)
MCV: 87.5 fL (ref 78.0–100.0)
Platelets: 25 10*3/uL — CL (ref 150–400)
RBC: 4.15 MIL/uL — ABNORMAL LOW (ref 4.22–5.81)
RDW: 15.6 % — AB (ref 11.5–15.5)
WBC: 10.9 10*3/uL — ABNORMAL HIGH (ref 4.0–10.5)

## 2016-02-09 LAB — TYPE AND SCREEN
ABO/RH(D): O POS
Antibody Screen: NEGATIVE

## 2016-02-09 LAB — TSH: TSH: 0.037 u[IU]/mL — ABNORMAL LOW (ref 0.350–4.500)

## 2016-02-09 LAB — ECHOCARDIOGRAM COMPLETE
Height: 70 in
WEIGHTICAEL: 3594.38 [oz_av]

## 2016-02-09 LAB — I-STAT CG4 LACTIC ACID, ED: Lactic Acid, Venous: 3.38 mmol/L (ref 0.5–2.0)

## 2016-02-09 LAB — T4, FREE: FREE T4: 2.37 ng/dL — AB (ref 0.61–1.12)

## 2016-02-09 LAB — TROPONIN I: Troponin I: 0.12 ng/mL — ABNORMAL HIGH (ref <0.031)

## 2016-02-09 LAB — LACTIC ACID, PLASMA: LACTIC ACID, VENOUS: 2.1 mmol/L — AB (ref 0.5–2.0)

## 2016-02-09 LAB — MRSA PCR SCREENING: MRSA BY PCR: NEGATIVE

## 2016-02-09 MED ORDER — DIGOXIN 0.25 MG/ML IJ SOLN: 0.1250 mg | Freq: Every day | INTRAMUSCULAR | Status: DC

## 2016-02-09 MED ORDER — INSULIN ASPART 100 UNIT/ML ~~LOC~~ SOLN: 0.0000 [IU] | Freq: Every day | SUBCUTANEOUS | Status: DC

## 2016-02-09 MED ORDER — DIGOXIN 0.25 MG/ML IJ SOLN
0.2500 mg | Freq: Once | INTRAMUSCULAR | Status: AC
  Administered 2016-02-09: 0.25 mg via INTRAVENOUS
  Filled 2016-02-09: qty 1

## 2016-02-09 MED ORDER — ONDANSETRON HCL 4 MG/2ML IJ SOLN: 4.0000 mg | Freq: Four times a day (QID) | INTRAMUSCULAR | Status: DC | PRN

## 2016-02-09 MED ORDER — SODIUM CHLORIDE 0.9 % IV BOLUS (SEPSIS)
500.0000 mL | Freq: Once | INTRAVENOUS | Status: AC
  Administered 2016-02-09: 500 mL via INTRAVENOUS

## 2016-02-09 MED ORDER — DEXAMETHASONE 2 MG PO TABS: 4.0000 mg | ORAL_TABLET | Freq: Three times a day (TID) | ORAL | Status: DC

## 2016-02-09 MED ORDER — ACETAMINOPHEN 650 MG RE SUPP: 650.0000 mg | Freq: Four times a day (QID) | RECTAL | Status: DC | PRN

## 2016-02-09 MED ORDER — CETYLPYRIDINIUM CHLORIDE 0.05 % MT LIQD
7.0000 mL | Freq: Two times a day (BID) | OROMUCOSAL | Status: DC
  Administered 2016-02-09 – 2016-02-10 (×3): 7 mL via OROMUCOSAL

## 2016-02-09 MED ORDER — LORAZEPAM 2 MG/ML PO CONC: 1.0000 mg | ORAL | Status: DC | PRN

## 2016-02-09 MED ORDER — DEXAMETHASONE SODIUM PHOSPHATE 4 MG/ML IJ SOLN
40.0000 mg | Freq: Every day | INTRAMUSCULAR | Status: DC
  Filled 2016-02-09 (×2): qty 10

## 2016-02-09 MED ORDER — ONDANSETRON HCL 4 MG PO TABS
4.0000 mg | ORAL_TABLET | Freq: Four times a day (QID) | ORAL | Status: DC | PRN
Start: 2016-02-09 — End: 2016-02-09

## 2016-02-09 MED ORDER — DILTIAZEM HCL 25 MG/5ML IV SOLN
15.0000 mg | Freq: Once | INTRAVENOUS | Status: AC
  Administered 2016-02-09: 15 mg via INTRAVENOUS
  Filled 2016-02-09: qty 5

## 2016-02-09 MED ORDER — ACETAMINOPHEN 325 MG PO TABS
650.0000 mg | ORAL_TABLET | Freq: Four times a day (QID) | ORAL | Status: DC | PRN
Start: 2016-02-09 — End: 2016-02-09

## 2016-02-09 MED ORDER — GLYCOPYRROLATE 1 MG PO TABS
1.0000 mg | ORAL_TABLET | ORAL | Status: DC | PRN
  Filled 2016-02-09: qty 1

## 2016-02-09 MED ORDER — SODIUM CHLORIDE 0.9 % IV SOLN: INTRAVENOUS | Status: DC

## 2016-02-09 MED ORDER — SODIUM CHLORIDE 0.9 % IV BOLUS (SEPSIS)
1000.0000 mL | Freq: Once | INTRAVENOUS | Status: DC
  Administered 2016-02-09: 1000 mL via INTRAVENOUS

## 2016-02-09 MED ORDER — LORAZEPAM 1 MG PO TABS: 1.0000 mg | ORAL_TABLET | ORAL | Status: DC | PRN

## 2016-02-09 MED ORDER — ACETAMINOPHEN 325 MG PO TABS: 650.0000 mg | ORAL_TABLET | Freq: Four times a day (QID) | ORAL | Status: DC | PRN

## 2016-02-09 MED ORDER — POLYVINYL ALCOHOL 1.4 % OP SOLN
1.0000 [drp] | Freq: Four times a day (QID) | OPHTHALMIC | Status: DC | PRN
Start: 2016-02-09 — End: 2016-02-10
  Filled 2016-02-09: qty 15

## 2016-02-09 MED ORDER — GLYCOPYRROLATE 0.2 MG/ML IJ SOLN
0.2000 mg | INTRAMUSCULAR | Status: DC | PRN
  Filled 2016-02-09: qty 1

## 2016-02-09 MED ORDER — INSULIN ASPART 100 UNIT/ML ~~LOC~~ SOLN
0.0000 [IU] | Freq: Three times a day (TID) | SUBCUTANEOUS | Status: DC
  Administered 2016-02-09: 4 [IU] via SUBCUTANEOUS
  Administered 2016-02-09: 7 [IU] via SUBCUTANEOUS

## 2016-02-09 MED ORDER — ALPRAZOLAM 0.25 MG PO TABS: 0.2500 mg | ORAL_TABLET | Freq: Every evening | ORAL | Status: DC | PRN

## 2016-02-09 MED ORDER — METOPROLOL TARTRATE 25 MG PO TABS: 25.0000 mg | ORAL_TABLET | Freq: Two times a day (BID) | ORAL | Status: DC

## 2016-02-09 MED ORDER — ONDANSETRON 4 MG PO TBDP
4.0000 mg | ORAL_TABLET | Freq: Four times a day (QID) | ORAL | Status: DC | PRN
Start: 2016-02-09 — End: 2016-02-10
  Filled 2016-02-09: qty 1

## 2016-02-09 MED ORDER — HALOPERIDOL LACTATE 5 MG/ML IJ SOLN: 0.5000 mg | INTRAMUSCULAR | Status: DC | PRN

## 2016-02-09 MED ORDER — ACETAMINOPHEN 650 MG RE SUPP
650.0000 mg | Freq: Four times a day (QID) | RECTAL | Status: DC | PRN
Start: 2016-02-09 — End: 2016-02-09

## 2016-02-09 MED ORDER — METHIMAZOLE 5 MG PO TABS
5.0000 mg | ORAL_TABLET | Freq: Two times a day (BID) | ORAL | Status: DC
  Administered 2016-02-09 – 2016-02-10 (×2): 5 mg via ORAL
  Filled 2016-02-09 (×4): qty 1

## 2016-02-09 MED ORDER — TRAMADOL HCL 50 MG PO TABS
50.0000 mg | ORAL_TABLET | Freq: Four times a day (QID) | ORAL | Status: DC | PRN
Start: 2016-02-09 — End: 2016-02-09
  Administered 2016-02-09: 100 mg via ORAL
  Filled 2016-02-09: qty 2

## 2016-02-09 MED ORDER — DIGOXIN 0.25 MG/ML IJ SOLN
0.1250 mg | Freq: Once | INTRAMUSCULAR | Status: AC
  Administered 2016-02-09: 0.125 mg via INTRAVENOUS
  Filled 2016-02-09: qty 0.5

## 2016-02-09 MED ORDER — VITAMIN K1 10 MG/ML IJ SOLN
10.0000 mg | Freq: Once | INTRAVENOUS | Status: AC
  Administered 2016-02-09: 10 mg via INTRAVENOUS
  Filled 2016-02-09: qty 1

## 2016-02-09 MED ORDER — HALOPERIDOL 0.5 MG PO TABS
0.5000 mg | ORAL_TABLET | ORAL | Status: DC | PRN
  Filled 2016-02-09: qty 1

## 2016-02-09 MED ORDER — HALOPERIDOL LACTATE 2 MG/ML PO CONC
0.5000 mg | ORAL | Status: DC | PRN
  Filled 2016-02-09: qty 0.3

## 2016-02-09 MED ORDER — DEXAMETHASONE SODIUM PHOSPHATE 10 MG/ML IJ SOLN
40.0000 mg | INTRAMUSCULAR | Status: DC
Start: 2016-02-09 — End: 2016-02-10
  Administered 2016-02-09 – 2016-02-10 (×2): 40 mg via INTRAVENOUS
  Filled 2016-02-09 (×2): qty 4

## 2016-02-09 MED ORDER — BIOTENE DRY MOUTH MT LIQD: 15.0000 mL | OROMUCOSAL | Status: DC | PRN

## 2016-02-09 MED ORDER — DEXAMETHASONE SODIUM PHOSPHATE 10 MG/ML IJ SOLN: 40.0000 mg | INTRAMUSCULAR | Status: DC

## 2016-02-09 MED ORDER — SODIUM CHLORIDE 0.9% FLUSH
3.0000 mL | Freq: Two times a day (BID) | INTRAVENOUS | Status: DC
  Administered 2016-02-09: 3 mL via INTRAVENOUS

## 2016-02-09 MED ORDER — SODIUM CHLORIDE 0.9 % IV SOLN
INTRAVENOUS | Status: DC
  Administered 2016-02-09 (×2): via INTRAVENOUS

## 2016-02-09 MED ORDER — BISACODYL 10 MG RE SUPP: 10.0000 mg | Freq: Every day | RECTAL | Status: DC | PRN

## 2016-02-09 MED ORDER — DILTIAZEM HCL ER COATED BEADS 180 MG PO CP24
180.0000 mg | ORAL_CAPSULE | Freq: Every day | ORAL | Status: DC
  Administered 2016-02-09 – 2016-02-10 (×2): 180 mg via ORAL
  Filled 2016-02-09 (×2): qty 1

## 2016-02-09 MED ORDER — METHIMAZOLE 5 MG PO TABS
5.0000 mg | ORAL_TABLET | Freq: Every day | ORAL | Status: DC
  Administered 2016-02-09: 5 mg via ORAL
  Filled 2016-02-09: qty 1

## 2016-02-09 MED ORDER — ACETAMINOPHEN-CODEINE #3 300-30 MG PO TABS: 1.0000 | ORAL_TABLET | ORAL | Status: DC | PRN

## 2016-02-09 MED ORDER — MORPHINE SULFATE (PF) 2 MG/ML IV SOLN
2.0000 mg | INTRAVENOUS | Status: DC | PRN
  Administered 2016-02-09 – 2016-02-10 (×4): 2 mg via INTRAVENOUS
  Filled 2016-02-09 (×4): qty 1

## 2016-02-09 MED ORDER — LORAZEPAM 2 MG/ML IJ SOLN
1.0000 mg | INTRAMUSCULAR | Status: DC | PRN
  Administered 2016-02-09: 1 mg via INTRAVENOUS
  Filled 2016-02-09: qty 1

## 2016-02-09 NOTE — Care Management Note (Signed)
Case Management Note  Patient Details  Name: [name] MRN: [id number] Date of Birth: [birth year]  Subjective/Objective:      Platelets=6 infusion on going              Action/PlanDate:  February 09, 2016 Chart reviewed for concurrent status and case management needs. Will continue to follow the patient for changes and needs: Expected discharge date: [id number] Velva Harman, BSN, [place], [place]   Expected Discharge Date:   (unknown)               Expected Discharge Plan:  Home/Self Care  In-House Referral:  NA  Discharge planning Services  CM Consult  Post Acute Care Choice:  NA Choice offered to:  NA  DME Arranged:    DME Agency:     HH Arranged:    [place] Agency:     Status of Service:  In process, will continue to follow  Medicare Important Message Given:    Date Medicare IM Given:    Medicare IM give by:    Date Additional Medicare IM Given:    Additional Medicare Important Message give by:     If discussed at [place] of Stay Meetings, dates discussed:    Additional Comments:  Leeroy Cha, RN 02/09/2016, 10:51 AM

## 2016-02-09 NOTE — Progress Notes (Signed)
eLink Physician-Brief Progress Note Patient Name: [name] DOB: Jul 16, 1956 MRN: [id number]   Date of Service  02/09/2016  HPI/Events of Note  Arrives very comfortable appearing, some int fib rvr, but comes down to 105 HR, MAP improving, LActic clearing Got Tx plat  eICU Interventions       Intervention Category Evaluation Type: New Patient Evaluation  FEINSTEIN,DANIEL J. 02/09/2016, 3:10 AM

## 2016-02-09 NOTE — Progress Notes (Signed)
CHART NOTE The patient is seen today. Several family members at the bedside including his wife and the 2 daughters. Unfortunately his condition has deteriorated significantly over the last 7 days with significant thrombocytopenia. He presented to the emergency department yesterday with nose bleed as well as bloody right pleural fluid. His platelets count were down to 6000. He received 2 units of platelets transfusion with improvement of his platelets count to 25,000 today. The patient was supposed to start the first dose of his systemic therapy with Nat Math at the [facility] today. His performance status is very poor and I don't think the patient will be eligible to receive any treatment at this point. He and his family decided to go with comfort care and hospice at this point. I completely agree with the patient and his family regarding this decision. I will continue to provide the patient with supportive care as needed. Thank you for taking good care for [name]. Please call if you have any questions.

## 2016-02-09 NOTE — Progress Notes (Signed)
PROGRESS NOTE    [name]  GYJ:[id number] DOB: [birth year] DOA: 02/08/2016  PCP: [name], MD   Brief Narrative:  [name] is a 60 y.o. gentleman with widely metastatic NSCLS diagnosed 4/17, on O2,  paroxysmal atrial fibrillation (S/P cardioversion; has not been anticoagulated for some time), Hyperthyroidism, and HTN who presents for bleeding in pleurex catheter, rectal and nose bleeds. Found to be hypotensive and in A-fib with RVR. Platelet count 10. Had an appt today with Dr Julien Nordmann to discuss immuno and chemotherapy.  Subjective: Feels tired, weak and wants to be made comfortable. He states his children do not agree with him on this. He feels he is ready to die. No complaint of pain, nausea, vomiting, dyspnea. He has a cough due to choking on some water.   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   A-fib with RVR- elevated troponi - Cardizem bolus followed by oral Cardizem is helping - Dig loading  - cannot anticoagulate due to thrombocytopenia and current bleeding - stop checking Troponin which are likely elevated secondary to underlying issues  Abdominal distension - ultrasound shows only mild ascites    Graves disease - Tapazol needs to be increased to 5 mg BID based upon labs today    Epistaxis - resolved for now    BRBPR (bright red blood per rectum) - follow for further episodes  DVT prophylaxis:SCDs Code Status: DNR Family Communication: wife at bedside Disposition Plan: likely home with hospice vs hospice home Consultants:    Palliative  oncology Procedures:    Antimicrobials:  Anti-infectives    None       Objective: Filed Vitals:   02/09/16 0700 02/09/16 0800 02/09/16 1000 02/09/16 1200  BP: 87/36 82/36 102/40 96/40  Pulse:      Temp:  97.6 F (36.4 C)  97.6 F (36.4 C)  TempSrc:  Oral  Oral  Resp: '22 14 28 14  '$ Height:      Weight:      SpO2: 98% 98% 98% 100%    Intake/Output Summary (Last 24 hours) at 02/09/16 1253 Last data filed at 02/09/16 1200  Gross per 24 hour  Intake 5181.67 ml  Output    250 ml  Net 4931.67 ml   Filed Weights   02/09/16 0200  Weight: 101.9 kg (224 lb 10.4 oz)    Examination: General exam: Appears comfortable  HEENT: PERRLA, oral mucosa moist, no sclera icterus or thrush Respiratory system: Clear to auscultation. Respiratory effort normal.- 100 % on 3 L O2 Cardiovascular system: S1 & S2 heard, IIRR.  No murmurs  Gastrointestinal system: Abdomen soft, non-tender, + distended. Normal bowel sound. No organomegaly Central nervous system: Alert and oriented. No focal neurological deficits. Extremities: No cyanosis, clubbing or edema Skin: No rashes or ulcers Psychiatry:  Mood & affect appropriate.     Data Reviewed: I have personally reviewed following labs and imaging studies  CBC:  Recent Labs Lab 02/04/16 1123 02/08/16 2248 02/09/16 0720  WBC 21.8* 12.4* 10.9*  NEUTROABS 19.6* 10.3*  --   HGB 13.8 13.2  11.9*  HCT 44.1 38.5* 36.3*  MCV 88.9 85.0 87.5  PLT 12 Platelet count confirmed by slide estimate* 6* 25*   Basic Metabolic Panel:  Recent Labs Lab 02/04/16 1123 02/08/16 2248 02/09/16 0720  NA 128* 130* 135  K 5.3* 5.6* 4.8  CL  --  100* 106  CO2 25 21* 21*  GLUCOSE 523* 323* 196*  BUN 28.3* 46* 44*  CREATININE 0.9 1.09 0.79  CALCIUM 7.7* 7.5* 7.0*   GFR: Estimated Creatinine Clearance: 120.4 mL/min (by C-G formula based on Cr of 0.79). Liver Function Tests:  Recent Labs Lab 02/04/16 1123 02/08/16 2248  AST 11 20   ALT 16 22  ALKPHOS 90 69  BILITOT 0.72 1.1  PROT 5.0* 4.9*  ALBUMIN 2.0* 2.0*    Recent Labs Lab 02/08/16 2248  LIPASE 19   No results for input(s): AMMONIA in the last 168 hours. Coagulation Profile:  Recent Labs Lab 02/08/16 2248  INR 1.55*   Cardiac Enzymes:  Recent Labs Lab 02/09/16 0720  TROPONINI 0.12*   BNP (last 3 results) No results for input(s): PROBNP in the last 8760 hours. HbA1C: No results for input(s): HGBA1C in the last 72 hours. CBG:  Recent Labs Lab 02/09/16 0403 02/09/16 0721 02/09/16 1132  GLUCAP 199* 190* 213*   Lipid Profile: No results for input(s): CHOL, HDL, LDLCALC, TRIG, CHOLHDL, LDLDIRECT in the last 72 hours. Thyroid Function Tests:  Recent Labs  02/09/16 0720  TSH 0.037*  FREET4 2.37*   Anemia Panel: No results for input(s): VITAMINB12, FOLATE, FERRITIN, TIBC, IRON, RETICCTPCT in the last 72 hours. Urine analysis:    Component Value Date/Time   COLORURINE AMBER* 02/09/2016 0619   APPEARANCEUR CLOUDY* 02/09/2016 0619   LABSPEC 1.028 02/09/2016 0619   PHURINE 5.5 02/09/2016 0619   GLUCOSEU 100* 02/09/2016 0619   HGBUR NEGATIVE 02/09/2016 0619   BILIRUBINUR SMALL* 02/09/2016 0619   KETONESUR NEGATIVE 02/09/2016 0619   PROTEINUR NEGATIVE 02/09/2016 0619   UROBILINOGEN 1.0 03/26/2008 1557   NITRITE NEGATIVE 02/09/2016 0619   LEUKOCYTESUR NEGATIVE 02/09/2016 0619   Sepsis Labs: '@LABRCNTIP'$ (procalcitonin:4,lacticidven:4) ) Recent Results (from the past 240 hour(s))  MRSA PCR Screening     Status: None   Collection Time: 02/09/16  4:46 AM  Result Value Ref Range Status   MRSA by PCR NEGATIVE NEGATIVE Final    Comment:        The GeneXpert MRSA Assay (FDA approved for NASAL specimens only), is one component of a comprehensive MRSA colonization surveillance program. It is not intended to diagnose MRSA infection nor to guide or monitor treatment for MRSA infections.          Radiology Studies: US Abdomen  Limited  02/09/2016  CLINICAL DATA:  Metastatic lung carcinoma.  Ascites. EXAM: LIMITED ABDOMEN ULTRASOUND FOR ASCITES TECHNIQUE: Limited ultrasound survey for ascites was performed in all four abdominal quadrants. COMPARISON:  PET-CT on 01/08/2016 FINDINGS: Mild abdominal ascites seen an the left upper and lower quadrants, which is new compared to previous PET-CT. No significant ascites seen in the right upper or lower quadrants, however a right pleural effusion is again noted. IMPRESSION: Mild abdominal ascites in left upper and lower quadrants, new compared to prior PET-CT on 01/08/2016. Right pleural effusion again noted. Electronically Signed   By: Earle Gell M.D.   On: 02/09/2016 12:04   Dg Chest Port 1 View  02/08/2016  CLINICAL DATA:  Weakness.  Lung cancer EXAM: PORTABLE CHEST 1 VIEW COMPARISON:  01/22/2016 FINDINGS: Large right upper  lobe malignancy with mediastinal adenopathy. Small right pleural effusion. Tunneled pleural catheter overlapping the right base. Interstitial coarsening on the right which could be postobstructive or lymphangitic tumor. Left lung is clear. IMPRESSION: Stable from 01/22/2016. Right upper lobe malignancy with mediastinal adenopathy. Tunneled pleural catheter with small right effusion. Electronically Signed   By: Monte Fantasia M.D.   On: 02/08/2016 22:05      Scheduled Meds: . sodium chloride  10 mL/hr Intravenous Once  . antiseptic oral rinse  7 mL Mouth Rinse BID  . dexamethasone  40 mg Intravenous Q24H  . digoxin  0.125 mg Intravenous Once  . diltiazem  180 mg Oral Daily  . insulin aspart  0-20 Units Subcutaneous TID WC  . insulin aspart  0-5 Units Subcutaneous QHS  . methimazole  5 mg Oral Daily  . sodium chloride flush  3 mL Intravenous Q12H   Continuous Infusions: . sodium chloride 100 mL/hr at 02/09/16 0358     LOS: 0 days    Time spent in minutes: 9    [place], MD [facility] Pager: [email or web address] Password [email or web address] 02/09/2016,  12:53 PM

## 2016-02-09 NOTE — ED Notes (Signed)
Hospitalist at bedside 

## 2016-02-09 NOTE — Progress Notes (Signed)
*  PRELIMINARY RESULTS* Echocardiogram 2D Echocardiogram has been performed.  Leavy Cella 02/09/2016, 1:27 PM

## 2016-02-09 NOTE — H&P (Signed)
History and Physical    [name]:[id number] DOB: 09/05/1956 DOA: 02/08/2016  PCP: [name], MD  Oncology: Dr. Julien Nordmann  Patient coming from: Home  Chief Complaint: Nose bleeds, increased bloody output from pleurex catheter, BRBPR  HPI: [name] is a 60 y.o. gentleman with widely metastatic NSCLS, paroxysmal atrial fibrillation (S/P cardioversion; has not been anticoagulated for some time), Hyperthyroidism, and HTN who presents accompanied by his wife and grandson for evaluation of bleeding from multiple sites, as outlined above.  No fever, but he feels cold all the time.  He has had intermittent sweats.  No chest pain.  Chronic shortness of breath.  No nausea, vomiting, or abdominal pain.  No dysuria or hematuria.  He has started radiation therapy but not chemo.  ED Course: Evaluation concerning for multiple lab abnormalities, including platelet count of 6000, elevated lactic acid and leukocytosis but no definitive source of infection at this point, and atrial fibrillation with RVR and relative hypotension.  Case was reviewed by PCCM.  Hospitalist asked to admit to stepdown unit.  Transfusion of 2 packs of platelets already ordered.  No empiric antibiotics at this time.  Review of Systems: As per HPI otherwise 10 point review of systems negative.   Past Medical History  Diagnosis Date  . HTN (hypertension)     pt unaware of this  . Persistent atrial fibrillation ([place])     now SR after DCCV   . Obesity   . H/O hiatal hernia   . Moderate mitral regurgitation   . Hyperthyroidism     treated with tapazole  . Brain cancer ([place]) 01/14/16    multiple brain metastases  . Non-small cell cancer of right lung ([place]) 01/11/2016  . Brain metastasis ([place]) 01/18/2016  . Shortness of breath dyspnea     using 2 L O2   . Anxiety   . Hyperglycemia 02/04/2016    Past Surgical History  Procedure Laterality Date  . Lower back surgery      herniated nucleus pulposus left  L5-S1; semihemilaminectomy and diskectomy with microdissection with microscope  . Right cornea repair      secondary to injury from a bungee cord  . Tonsillectomy    . Cardioversion N/A 05/08/2013    unsuccessful for persistent afib  . Tee without cardioversion N/A 06/24/2014    Procedure: TRANSESOPHAGEAL ECHOCARDIOGRAM (TEE);  Surgeon: Josue Hector, MD;  Location: [facility];  Service: Cardiovascular;  Laterality: N/A;  . Cardioversion N/A 04/09/2015    Procedure: CARDIOVERSION;  Surgeon: Josue Hector, MD;  Location[facility] ENDOSCOPY;  Service: Cardiovascular;  Laterality: N/A;  . Video bronchoscopy Bilateral 01/01/2016    Procedure: VIDEO BRONCHOSCOPY WITH FLUORO;  Surgeon: Tanda Rockers, MD;  Location: [facility] ENDOSCOPY;  Service: Cardiopulmonary;  Laterality: Bilateral;  . Thoracentesis Right 01/10/16& 01/13/16    01/09/14= x1 liter , 01/13/16=x 2 liters  . Vasectomy    . Chest tube insertion Right 01/22/2016    Procedure: INSERTION RIGHT PLEURAL DRAINAGE CATHETER WITH FLUORO AND ULTRASOUND DRAINAGE OF RIGHT PLEURAL EFFUSION;  Surgeon: Grace Isaac, MD;  Location: [facility];  Service: Thoracic;  Laterality: Right;     reports that he quit smoking about 15 years ago. His smoking use included Cigarettes. He has a 40.5 pack-year smoking history. He quit smokeless tobacco use about 13 years ago. He reports that he does not drink alcohol or use illicit drugs.  No Known Allergies  Family History  Problem Relation Age of Onset  .  Diabetes Brother   . Hyperlipidemia Mother   . Pancreatic cancer Father 62  . Thyroid disease Mother   . Lung cancer Sister   . Ovarian cancer Sister     half sister     Prior to Admission medications   Medication Sig Start Date End Date Taking? Authorizing Provider  acetaminophen-codeine (TYLENOL #3) 300-30 MG tablet One every 4 hours as needed for cough 01/13/16  Yes Tanda Rockers, MD  ALPRAZolam Duanne Moron) 0.25 MG tablet Take 1 tablet (0.25 mg total) by mouth at  bedtime as needed for anxiety. 01/18/16  Yes Curt Bears, MD  budesonide-formoterol [facility]) 80-4.5 MCG/ACT inhaler Take 2 puffs first thing in am and then another 2 puffs about 12 hours later. 12/30/15  Yes Tanda Rockers, MD  dexamethasone (DECADRON) 4 MG tablet Take 1 tablet (4 mg total) by mouth 3 (three) times daily. 01/26/16  Yes Hayden Pedro, PA-C  methimazole (TAPAZOLE) 5 MG tablet Take 1 tablet (5 mg total) by mouth daily. 09/06/15  Yes Renato Shin, MD  metoprolol (LOPRESSOR) 50 MG tablet Take 0.5 tablets (25 mg total) by mouth 2 (two) times daily. Patient taking differently: Take 50 mg by mouth daily.  04/09/15  Yes Josue Hector, MD  omeprazole (PRILOSEC) 40 MG capsule Take 1 capsule (40 mg total) by mouth daily. 01/13/16  Yes Tanda Rockers, MD  promethazine-codeine [facility] WITH CODEINE) 6.25-10 MG/5ML syrup Take 5 mLs by mouth every 6 (six) hours as needed for cough. Reported on 02/04/2016 12/28/15  Yes Historical Provider, MD  OXYGEN Reported on 01/18/2016    Historical Provider, MD  traMADol (ULTRAM) 50 MG tablet Take 1-2 tablets (50-100 mg total) by mouth every 6 (six) hours as needed. 01/22/16   Ivin Poot, MD    Physical Exam: Filed Vitals:   02/09/16 0000 02/09/16 0030 02/09/16 0100 02/09/16 0209  BP: '83/64 92/60 95/59 '$ 110/42  Pulse: 51 131 52   Temp:    97.9 F (36.6 C)  TempSrc:    Oral  Resp: '25 16 23 18  '$ SpO2: 90% 95% 98% 95%      Constitutional: NAD, calm, chronically ill appearing Filed Vitals:   02/09/16 0000 02/09/16 0030 02/09/16 0100 02/09/16 0209  BP: '83/64 92/60 95/59 '$ 110/42  Pulse: 51 131 52   Temp:    97.9 F (36.6 C)  TempSrc:    Oral  Resp: '25 16 23 18  '$ SpO2: 90% 95% 98% 95%   Eyes: PERRL, lids and conjunctivae normal ENMT: Mucous membranes are dry. Posterior pharynx clear of any exudate or lesions.Normal dentition. No active epistaxis at this time. Neck: normal, supple Respiratory: No wheezing, no crackles. Normal respiratory  effort. No accessory muscle use.  Cardiovascular: Tachycardic and irregular.  No extremity edema. 2+ pedal pulses. Abdomen: no tenderness, no masses palpated.  Bowel sounds positive.  Musculoskeletal: Moves all four extremities spontaneously.  No contractures. Skin: petechial rash on trunk, extremities.  Skin is pale, dry. Neurologic: CN 2-12 grossly intact. Sensation intact, Strength 5/5 in all 4.  Psychiatric: Normal judgment and insight. Alert and oriented x 3. Flat affect.    Labs on Admission: I have personally reviewed following labs and imaging studies  CBC:  Recent Labs Lab 02/04/16 1123 02/08/16 2248  WBC 21.8* 12.4*  NEUTROABS 19.6* 10.3*  HGB 13.8 13.2  HCT 44.1 38.5*  MCV 88.9 85.0  PLT 12 Platelet count confirmed by slide estimate* 6*   Basic Metabolic Panel:  Recent Labs Lab 02/04/16  1123 02/08/16 2248  NA 128* 130*  K 5.3* 5.6*  CL  --  100*  CO2 25 21*  GLUCOSE 523* 323*  BUN 28.3* 46*  CREATININE 0.9 1.09  CALCIUM 7.7* 7.5*   GFR: Estimated Creatinine Clearance: 90.2 mL/min (by C-G formula based on Cr of 1.09). Liver Function Tests:  Recent Labs Lab 02/04/16 1123 02/08/16 2248  AST 11 20  ALT 16 22  ALKPHOS 90 69  BILITOT 0.72 1.1  PROT 5.0* 4.9*  ALBUMIN 2.0* 2.0*    Recent Labs Lab 02/08/16 2248  LIPASE 19  Coagulation Profile:  Recent Labs Lab 02/08/16 2248  INR 1.55*    Sepsis Labs: Lactic acid 5.12, repeat 3.38  Radiological Exams on Admission: Dg Chest Port 1 View  02/08/2016  CLINICAL DATA:  Weakness.  Lung cancer EXAM: PORTABLE CHEST 1 VIEW COMPARISON:  01/22/2016 FINDINGS: Large right upper lobe malignancy with mediastinal adenopathy. Small right pleural effusion. Tunneled pleural catheter overlapping the right base. Interstitial coarsening on the right which could be postobstructive or lymphangitic tumor. Left lung is clear. IMPRESSION: Stable from 01/22/2016. Right upper lobe malignancy with mediastinal adenopathy.  Tunneled pleural catheter with small right effusion. Electronically Signed   By: Monte Fantasia M.D.   On: 02/08/2016 22:05    EKG: Independently reviewed. Atrial fibrillation with RVR  Assessment/Plan Principal Problem:   Thrombocytopenia ([place]) Active Problems:   Graves disease   Pleural effusion on right   Cancer of upper lobe of right lung (HCC)   Epistaxis   BRBPR (bright red blood per rectum)   Atrial fibrillation with RVR (HCC)    Thrombocytopenia, likely related to malignancy --Agree with platelet transfusion because he is certainly at risk for spontaneous hemorrhage with platelet count less than 10,000. --I discussed briefly with the on-call oncology attending; will give high dose decadron '40mg'$  IV daily x 4 doses (will hold oral decadron for now) --Repeat CBC when platelet transfusion complete  Coagulapathy with INR 1.55, likely related to metastatic disease in the liver --Vitamin K ordered in the ED  Malignant effusion --Daily drainage per routine from pleurex catheter in right chest  History of hyperthyroidism --Continue home dose of methimazole  Lactic acidosis --Improving with hydration, continue to trend  Metastatic NSCLC --Will need to formally consult Dr. Julien Nordmann in the morning.  Atrial fibrillation with RVR --Will try digoxin load since he has relative hypotension --Cautious hydration --Troponin, TFTs, echo in the AM  DVT prophylaxis: SCDs, active bleeding Code Status: DNR/DNI Family Communication: Wife, grandson at bedside Disposition Plan: To be determined Consults called: PCCM called from the ED Admission status: Inpatient, SDU  Eber Jones MD [facility]   If 7PM-7AM, please contact night-coverage [email or web address] Password [email or web address]  02/09/2016, 2:30 AM

## 2016-02-09 NOTE — ED Notes (Signed)
PATIENT SIGNED INFORMED CONSENT FOR BLOOD TRANSFUSION AND/OR BLOOD PRODUCTS. WITNESSED BY Duard Larsen RN.

## 2016-02-09 NOTE — Consult Note (Signed)
Consultation Note Date: 02/09/2016   Patient Name: [name]  DOB: 1956-07-02  MRN: [id number]  Age / Sex: 60 y.o., male  PCP: [name], MD Referring Physician: Debbe Odea, MD  Reason for Consultation: Establishing goals of care  HPI/Patient Profile: 60 y.o. male  with past medical history of metastatic NSCLC admitted on 02/08/2016 with acute nose and pleurex catheter bleeding.   Clinical Assessment and Goals of Care: Met today with [name], his wife, his mother, and one of his daughters.  He reports the most important thing to him is being comfortable, his family, and "not prolonging the inevitable."  He reports his doctors and under good job explaining things to him and he has good understanding of the severity of his condition. He reports that based upon his current situation, his wish is to focus strictly on comfort. He does not want to pursue any further workup, treatment, or evaluation for his cancer, thrombocytopenia, or acute bleeding if it were to recur. He reports that he does not want to be at the hospital at the time of death.  We discussed that with his severe thrombocytopenia (it was improved this a.m. following platelet transfusion to 25,000), he remains at high risk for acute, terminal bleed.  He denies any active symptoms at this time including pain, shortness of breath, anxiety, or nausea. He would like to receive medication to help him sleep at night.  We discussed options for comfort care moving forward as well as recommendation for utilizing hospice services from this point forward. We discussed options for hospice at home versus pursuing residential hospice placement. He is family are in agreement that he would best be served by residential hospice placement and expressed preference for [facility] as they have long-standing relationship with prior director of  [place].  SUMMARY OF RECOMMENDATIONS   - DNR/DNI - Comfort Care only.  Comfort care orders per end of life order set. - He would like to transition from CCU to floor today - He would like to pursue end of life care at [facility].  Consult to SW placed. - He remains at high risk for recurrent bleeding and acute, terminal hemorrhage.  I think that residential hospice is most appropriate plan for discharge.  Code Status/Advance Care Planning:  DNR   Symptom Management:   Bleeding: currently controlled, however he remains at high risk for acute, terminal bleed.  Continue to monitor closely.  I placed Fast Fact regarding terminal hemorrhage on chart for nursing to have available for reference.  Pain/SOB: morphine as needed  Anxiety: ativan as needed  Insomnia: Ativan or trazodone  Palliative Prophylaxis:   Aspiration, Bowel Regimen, Delirium Protocol and Frequent Pain Assessment  Additional Recommendations (Limitations, Scope, Preferences):  Full Comfort Care  Psycho-social/Spiritual:   Desire for further Chaplaincy support:no.  He is an Engineer, maintenance and specifically does not want chaplain "trying to convert me." He would be agreeable for secular chaplain for emotional support for family.  Additional Recommendations: Grief/Bereavement Support  Prognosis:  Days to weeks.  I suspect his course will be a matter of days most likely, however, he had severe thrombocytopenia and was actively bleeding on admission.  He has still been having some blood per rectum.  His platelets were at 25000 following transfusion and are likely dropping again.  He is at high risk for quick onset of symptoms including acute terminal bleed and exsanguination.  He will best be served by residential hospice placement for end of life care.     Discharge Planning: Hospice facility. Family would like to pursue placement at [facility].      Primary Diagnoses: Present on Admission:  . Thrombocytopenia ([place]) .  Graves disease . Pleural effusion on right . Cancer of upper lobe of right lung ([place])  I have reviewed the medical record, interviewed the patient and family, and examined the patient. The following aspects are pertinent.  Past Medical History  Diagnosis Date  . HTN (hypertension)     pt unaware of this  . Persistent atrial fibrillation ([place])     now SR after DCCV   . Obesity   . H/O hiatal hernia   . Moderate mitral regurgitation   . Hyperthyroidism     treated with tapazole  . Brain cancer ([place]) 01/14/16    multiple brain metastases  . Non-small cell cancer of right lung ([place]) 01/11/2016  . Brain metastasis ([place]) 01/18/2016  . Shortness of breath dyspnea     using 2 L O2   . Anxiety   . Hyperglycemia 02/04/2016   Social History   Social History  . Marital Status: Married    Spouse Name: N/A  . Number of Children: N/A  . 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 Smoking status: Former Smoker -- 1.50 packs/day for 27 years    Types: Cigarettes    Quit date: 09/05/2000  . Smokeless tobacco: Former Systems developer    Quit date: 05/08/2002  . Alcohol Use: No  . Drug Use: No  . Sexual Activity: Not Currently   Other Topics Concern  . None   Social History Narrative   Pt lives in [place] with spouse.  Truck driver   Family History  Problem Relation Age of Onset  . Diabetes Brother   . Hyperlipidemia Mother   . Pancreatic cancer Father 9  . Thyroid disease Mother   . Lung cancer Sister   . Ovarian cancer Sister     half sister   Scheduled Meds: . sodium chloride  10 mL/hr Intravenous Once  . antiseptic oral rinse  7 mL Mouth Rinse BID  . dexamethasone  40 mg Intravenous Q24H  . diltiazem  180 mg Oral Daily  . methimazole  5 mg Oral BID   Continuous Infusions:  PRN Meds:.acetaminophen **OR** acetaminophen, antiseptic oral rinse, bisacodyl, glycopyrrolate **OR** glycopyrrolate **OR** glycopyrrolate, haloperidol **OR**  haloperidol **OR** haloperidol lactate, LORazepam **OR** LORazepam **OR** LORazepam, morphine injection, ondansetron **OR** ondansetron (ZOFRAN) IV, polyvinyl alcohol Medications Prior to Admission:  Prior to Admission medications   Medication Sig Start Date End Date Taking? Authorizing Provider  acetaminophen-codeine (TYLENOL #3) 300-30 MG tablet One every 4 hours as needed for cough 01/13/16  Yes Tanda Rockers, MD  ALPRAZolam Duanne Moron) 0.25 MG tablet Take 1 tablet (0.25 mg total) by mouth at bedtime as needed for anxiety. 01/18/16  Yes Curt Bears, MD  budesonide-formoterol [facility]) 80-4.5 MCG/ACT inhaler Take 2 puffs first thing in am  and then another 2 puffs about 12 hours later. 12/30/15  Yes Tanda Rockers, MD  dexamethasone (DECADRON) 4 MG tablet Take 1 tablet (4 mg total) by mouth 3 (three) times daily. 01/26/16  Yes Hayden Pedro, PA-C  methimazole (TAPAZOLE) 5 MG tablet Take 1 tablet (5 mg total) by mouth daily. 09/06/15  Yes Renato Shin, MD  metoprolol (LOPRESSOR) 50 MG tablet Take 0.5 tablets (25 mg total) by mouth 2 (two) times daily. Patient taking differently: Take 50 mg by mouth daily.  04/09/15  Yes Josue Hector, MD  omeprazole (PRILOSEC) 40 MG capsule Take 1 capsule (40 mg total) by mouth daily. 01/13/16  Yes Tanda Rockers, MD  promethazine-codeine [facility] WITH CODEINE) 6.25-10 MG/5ML syrup Take 5 mLs by mouth every 6 (six) hours as needed for cough. Reported on 02/04/2016 12/28/15  Yes Historical Provider, MD  OXYGEN Reported on 01/18/2016    Historical Provider, MD  traMADol (ULTRAM) 50 MG tablet Take 1-2 tablets (50-100 mg total) by mouth every 6 (six) hours as needed. 01/22/16   Ivin Poot, MD   No Known Allergies Review of Systems   Constitutional: Positive for activity change, appetite change and fatigue.  Gastrointestinal: Positive for blood in stool and abdominal distention.  Psychiatric/Behavioral: Positive for sleep disturbance.  All other systems reviewed  and are negative.   Physical Exam  General: Sleepy but awake, in no acute distress.  HEENT: No bruits, no goiter, no JVD Heart: Irregular. No murmur appreciated. Lungs: Good air movement, clear Abdomen: Soft, nontender, distended, positive bowel sounds.  Ext: No significant edema Skin: Warm and dry Neuro: Grossly intact, nonfocal. .  Vital Signs: BP 110/60 mmHg  Pulse 52  Temp(Src) 98.5 F (36.9 C) (Oral)  Resp 21  Ht 5' 10"  (1.778 m)  Wt 101.9 kg (224 lb 10.4 oz)  BMI 32.23 kg/m2  SpO2 95% Pain Assessment: 0-10   Pain Score: Asleep   SpO2: SpO2: 95 % O2 Device:SpO2: 95 % O2 Flow Rate: .O2 Flow Rate (L/min): 3 L/min  IO: Intake/output summary:  Intake/Output Summary (Last 24 hours) at 02/09/16 1812 Last data filed at 02/09/16 1400  Gross per 24 hour  Intake 5381.67 ml  Output    250 ml  Net 5131.67 ml    LBM:   Baseline Weight: Weight: 101.9 kg (224 lb 10.4 oz) Most recent weight: Weight: 101.9 kg (224 lb 10.4 oz)     Palliative Assessment/Data: 30%   Flowsheet Rows        Most Recent Value   Intake Tab    Referral Department  Hospitalist   Unit at Time of Referral  ICU   Palliative Care Primary Diagnosis  Cancer   Date Notified  02/09/16   Palliative Care Type  New Palliative care   Reason for referral  Clarify Goals of Care, End of Life Care Assistance   Date of Admission  02/08/16   Date first seen by Palliative Care  02/09/16   # of days Palliative referral response time  0 Day(s)   # of days IP prior to Palliative referral  1   Clinical Assessment    Palliative Performance Scale Score  30%   Pain Max last 24 hours  2   Pain Min Last 24 hours  0   Psychosocial & Spiritual Assessment    Palliative Care Outcomes    Patient/Family meeting held?  Yes   Who was at the meeting?  patient, wife, daughter, mother   Palliative  Care Outcomes  Clarified goals of care, Counseled regarding hospice, Transitioned to hospice, Changed to focus on comfort       Time In: 1655 Time Out: 1755 Time Total: 60 Greater than 50%  of this time was spent counseling and coordinating care related to the above assessment and plan.  Signed by: Micheline Rough, MD   Please contact Palliative Medicine Team phone at [phone] for questions and concerns.  For individual provider: See Shea Evans

## 2016-02-09 NOTE — ED Notes (Signed)
Spoke with ICU, ICU 20 minute can start.

## 2016-02-10 ENCOUNTER — Ambulatory Visit: Payer: PRIVATE HEALTH INSURANCE

## 2016-02-10 DIAGNOSIS — R040 Epistaxis: Secondary | ICD-10-CM

## 2016-02-10 DIAGNOSIS — D696 Thrombocytopenia, unspecified: Secondary | ICD-10-CM

## 2016-02-10 DIAGNOSIS — E0500 Thyrotoxicosis with diffuse goiter without thyrotoxic crisis or storm: Secondary | ICD-10-CM

## 2016-02-10 DIAGNOSIS — I480 Paroxysmal atrial fibrillation: Secondary | ICD-10-CM

## 2016-02-10 DIAGNOSIS — K625 Hemorrhage of anus and rectum: Secondary | ICD-10-CM

## 2016-02-10 LAB — PREPARE PLATELET PHERESIS
UNIT DIVISION: 0
Unit division: 0

## 2016-02-10 LAB — HEMOGLOBIN A1C
HEMOGLOBIN A1C: 7.8 % — AB (ref 4.8–5.6)
MEAN PLASMA GLUCOSE: 177 mg/dL

## 2016-02-10 MED ORDER — METOPROLOL TARTRATE 50 MG PO TABS: 50.0000 mg | ORAL_TABLET | Freq: Every day | ORAL | Status: AC

## 2016-02-10 MED ORDER — MORPHINE SULFATE (PF) 2 MG/ML IV SOLN: 2.0000 mg | INTRAVENOUS | Status: AC | PRN

## 2016-02-10 MED ORDER — POLYETHYLENE GLYCOL 3350 17 G PO PACK: 17.0000 g | PACK | Freq: Every day | ORAL | Status: DC

## 2016-02-10 MED ORDER — SENNOSIDES-DOCUSATE SODIUM 8.6-50 MG PO TABS: 1.0000 | ORAL_TABLET | Freq: Two times a day (BID) | ORAL | Status: DC

## 2016-02-10 MED ORDER — POLYETHYLENE GLYCOL 3350 17 G PO PACK: 17.0000 g | PACK | Freq: Every day | ORAL | Status: AC

## 2016-02-10 NOTE — Progress Notes (Signed)
CSW assisting with d/c planning. Hospice Home at [facility] is able to accept pt today. Pt / family are in agreement with this plan. [facility] transport is required. Pt requires oxygen for transport. Medical necessity form completed. # for report provided to nsg. Hospice liaison will provide d/c summary to hospice home once completed.  Werner Lean LCSW [phone]

## 2016-02-10 NOTE — Progress Notes (Signed)
Daily Progress Note   Patient Name: [name]       Date: 02/10/2016 DOB: 1956/07/23  Age: 60 y.o. MRN#: [id number] Attending Physician: Lavina Hamman, MD Primary Care Physician: [name], MD Admit Date: 02/08/2016  Reason for Consultation/Follow-up: Establishing goals of care  Subjective: Met with patient and his wife this AM.  He currently denies complaints and states that Feeling "about as good as I can."  He does wife remains hopeful that he will be able to secure bed at residential hospice facility for end-of-life care.  Length of Stay: 1  Current Medications: Scheduled Meds:  . sodium chloride  10 mL/hr Intravenous Once  . antiseptic oral rinse  7 mL Mouth Rinse BID  . dexamethasone  40 mg Intravenous Q24H  . diltiazem  180 mg Oral Daily  . methimazole  5 mg Oral BID    Continuous Infusions:    PRN Meds: acetaminophen **OR** acetaminophen, antiseptic oral rinse, bisacodyl, glycopyrrolate **OR** glycopyrrolate **OR** glycopyrrolate, haloperidol **OR** haloperidol **OR** haloperidol lactate, LORazepam **OR** LORazepam **OR** LORazepam, morphine injection, ondansetron **OR** ondansetron (ZOFRAN) IV, polyvinyl alcohol  Physical Exam         General: Ill-appearing male lying in bed, in no acute distress.  Heart: Irregular. No murmur appreciated. Lungs: Good air movement, clear Abdomen: Soft, nontender, distended, positive bowel sounds.  Ext: No significant edema Skin: Warm and dry Neuro: Grossly intact, nonfocal.  Vital Signs: BP 112/73 mmHg  Pulse 93  Temp(Src) 97.9 F (36.6 C) (Oral)  Resp 22  Ht 5' 9"  (1.753 m)  Wt 105 kg (231 lb 7.7 oz)  BMI 34.17 kg/m2  SpO2 95% SpO2: SpO2: 95 % O2 Device: O2 Device: Nasal Cannula O2 Flow Rate: O2 Flow Rate  (L/min): 3 L/min  Intake/output summary:  Intake/Output Summary (Last 24 hours) at 02/10/16 1005 Last data filed at 02/10/16 7847  Gross per 24 hour  Intake    400 ml  Output    400 ml  Net      0 ml   LBM:   Baseline Weight: Weight: 101.9 kg (224 lb 10.4 oz) Most recent weight: Weight: 105 kg (231 lb 7.7 oz)       Palliative Assessment/Data: 30%   Flowsheet Rows        Most Recent Value  Intake Tab    Referral Department  Hospitalist   Unit at Time of Referral  ICU   Palliative Care Primary Diagnosis  Cancer   Date Notified  02/09/16   Palliative Care Type  New Palliative care   Reason for referral  Clarify Goals of Care, End of Life Care Assistance   Date of Admission  02/08/16   Date first seen by Palliative Care  02/09/16   # of days Palliative referral response time  0 Day(s)   # of days IP prior to Palliative referral  1   Clinical Assessment    Palliative Performance Scale Score  30%   Pain Max last 24 hours  2   Pain Min Last 24 hours  0   Psychosocial & Spiritual Assessment    Palliative Care Outcomes    Patient/Family meeting held?  Yes   Who was at the meeting?  patient, wife, daughter, mother   Palliative Care Outcomes  Clarified goals of care, Counseled regarding hospice, Transitioned to hospice, Changed to focus on comfort      Patient Active Problem List   Diagnosis Date Noted  . Epistaxis 02/09/2016  . BRBPR (bright red blood per rectum) 02/09/2016  . Atrial fibrillation with RVR ([place]) 02/09/2016  . Paroxysmal atrial fibrillation ([facility])   . Weakness   . Lactic acidosis   . Orthostatic hypotension   . Hyperglycemia 02/04/2016  . Thrombocytopenia ([place]) 02/04/2016  . Brain metastasis ([place]) 01/18/2016  . Metastasis to brain ([place]) 01/14/2016  . Cancer of upper lobe of right lung ([place]) 01/11/2016  . Pleural effusion 01/09/2016  . Hypoxia 01/09/2016  . Pleural effusion on right 01/08/2016  . Lung mass 12/30/2015  . Cough variant asthma 12/30/2015   . Abnormality of lung on CXR 12/28/2015  . Hyperthyroidism 11/17/2014  . Graves disease 06/17/2014  . Routine general medical examination at a health care facility 06/17/2014  . Mitral regurgitation 06/03/2013  . Obesity 02/21/2009  . Essential hypertension 02/21/2009  . Persistent atrial fibrillation ([place]) 02/21/2009  . Venous stasis 02/21/2009    Palliative Care Assessment & Plan   Recommendations/Plan: - DNR/DNI - Comfort Care only. Comfort care orders in place per end of life order set. - He would like to pursue end of life care at [facility]. SW sent referral to [facility]. - He remains at high risk for recurrent bleeding and acute, terminal hemorrhage. I think that residential hospice is most appropriate plan for discharge.  Symptom management:  Bleeding: currently controlled, however he remains at high risk for acute, terminal bleed. Continue to monitor closely. I placed Fast Fact regarding terminal hemorrhage on chart for nursing to have available for reference.  Pain/SOB: morphine as needed  Anxiety: ativan as needed  Insomnia: Ativan or trazodone as needed  Code Status:    Code Status Orders        Start     Ordered   02/09/16 1726  Do not attempt resuscitation (DNR)   Continuous    Question Answer Comment  In the event of cardiac or respiratory ARREST Do not call a "code blue"   In the event of cardiac or respiratory ARREST Do not perform Intubation, CPR, defibrillation or ACLS   In the event of cardiac or respiratory ARREST Use medication by any route, position, wound care, and other measures to relive pain and suffering. May use oxygen, suction and manual treatment of airway obstruction as needed for comfort.      02/09/16 1727  Code Status History    Date Active Date Inactive Code Status Order ID Comments User Context   02/09/2016  1:58 AM 02/09/2016  5:27 PM DNR [id number]  Lily Kocher, MD Inpatient   01/09/2016  7:59 PM 01/11/2016  8:56 PM DNR [id number]   [name], MD ED       Prognosis:  Days to weeks. I suspect his course will be a matter of days most likely. He had severe thrombocytopenia and was actively bleeding on admission. His platelets were at 25000 yesterday following transfusion and are likely dropping again. He is at high risk for quick onset of symptoms including acute terminal bleed and exsanguination. He will best be served by residential hospice placement for end of life care.  Discharge Planning: Hospice facility. Family would like to pursue placement at [facility] or [facility] in [place].   Care plan was discussed with patient, his wife, social work, and Dr. Posey Pronto  Thank you for allowing the Palliative Medicine Team to assist in the care of this patient.   Time In: 0940 Time Out: 1000 Total Time 20 Prolonged Time Billed No      Greater than 50%  of this time was spent counseling and coordinating care related to the above assessment and plan.  Micheline Rough, MD  Please contact Palliative Medicine Team phone at [phone] for questions and concerns.

## 2016-02-10 NOTE — Discharge Summary (Signed)
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 Please follow up with Dr Julien Nordmann as needed   Follow-up Information    Call Eilleen Kempf., MD.   Specialty:  Oncology   Why:  As needed   Contact information:   [place] [address] [phone]      Diet recommendation: regular deit  Activity: The patient is advised to gradually reintroduce usual activities.  Discharge Condition: stable  History of present illness: As per the H and P dictated on admission, "[name] is a 59 y.o. gentleman with widely metastatic NSCLS, paroxysmal atrial fibrillation (S/P cardioversion; has not been anticoagulated for some time), Hyperthyroidism, and HTN who presents accompanied by his wife and grandson for evaluation of bleeding from multiple sites, as outlined above. No fever, but he feels cold all the time. He has had intermittent sweats. No chest pain. Chronic shortness of breath. No nausea, vomiting, or abdominal pain. No dysuria or hematuria. He has started radiation therapy but not chemo."  Hospital Course:  Summary of his active problems in the hospital is as following.  Principal Problem:   Thrombocytopenia ([place]) Patient presented with nosebleed as well as blood from Pleurx catheter and BRBPR. Status post 2 platelet transfusion. Patient was also started on high-dose Decadron. His platelet improved from  6000-25,000. Oncology Dr. Julien Nordmann followed up with the patient and after discussion with him with the patient and family decided to go for hospice. Relative care was consulted who felt that with severe trauma cytopenia patient is at high likelihood of recurrent bleeding and would be an ideal candidate for inpatient hospice. Placement was arranged at [facility]. Patient did not have any significant pain here in the hospital.  Active Problems:   Graves disease Continue methimazole.    Pleural effusion on right Continue Pleurx catheter and drain as needed for palliative care.    Cancer of upper lobe of right lung [facility]) Patient now on hospice. Patient was given 40 mg high-dose Decadron in the hospital. We will transition to home Decadron on discharge    Atrial fibrillation with RVR ([place]) Patient was given Cardizem bolus as well as Cardizem here in the hospital. Continue metoprolol on discharge.  Constipation. Continue when necessary stool softener.   All other chronic medical condition were stable during the hospitalization.  Patient was palliative care and in patient hospice was recommended, which was arranged by social worker and case Freight forwarder. On the day of the discharge the patient's pain was well controleld, and no other acute medical condition were reported by patient. the patient was felt safe to be discharge at in patient hospice with hospice.  Procedures and Results:  none   Consultations:  Palliative care  Medical oncology  DISCHARGE MEDICATION: Current Discharge Medication List    START taking these medications   Details  morphine 2 MG/ML injection Inject 1 mL (2 mg total) into  the vein every 4 (four) hours as needed. Qty: 1 mL, Refills: 0    polyethylene glycol (MIRALAX / GLYCOLAX) packet Take 17 g by mouth daily. Qty: 14 each, Refills: 0      CONTINUE these medications which have CHANGED   Details  metoprolol (LOPRESSOR) 50 MG tablet Take 1 tablet  (50 mg total) by mouth daily. Qty: 60 tablet, Refills: 0      CONTINUE these medications which have NOT CHANGED   Details  ALPRAZolam (XANAX) 0.25 MG tablet Take 1 tablet (0.25 mg total) by mouth at bedtime as needed for anxiety. Qty: 30 tablet, Refills: 0   Associated Diagnoses: Non-small cell cancer of right lung ([place]); Pleural effusion on right; Brain metastasis (HCC)    budesonide-formoterol (SYMBICORT) 80-4.5 MCG/ACT inhaler Take 2 puffs first thing in am and then another 2 puffs about 12 hours later.    dexamethasone (DECADRON) 4 MG tablet Take 1 tablet (4 mg total) by mouth 3 (three) times daily. Qty: 120 tablet, Refills: 0   Associated Diagnoses: Non-small cell cancer of right lung ([place]); Metastasis to brain (HCC)    methimazole (TAPAZOLE) 5 MG tablet Take 1 tablet (5 mg total) by mouth daily. Qty: 90 tablet, Refills: 0    omeprazole (PRILOSEC) 40 MG capsule Take 1 capsule (40 mg total) by mouth daily. Qty: 90 capsule, Refills: 1    promethazine-codeine (PHENERGAN WITH CODEINE) 6.25-10 MG/5ML syrup Take 5 mLs by mouth every 6 (six) hours as needed for cough. Reported on 02/04/2016 Refills: 0   Associated Diagnoses: Non-small cell cancer of right lung ([place]); Pleural effusion on right; Brain metastasis (HCC)    OXYGEN Reported on 01/18/2016    traMADol (ULTRAM) 50 MG tablet Take 1-2 tablets (50-100 mg total) by mouth every 6 (six) hours as needed. Qty: 40 tablet, Refills: 0   Associated Diagnoses: Post-op pain      STOP taking these medications     acetaminophen-codeine (TYLENOL #3) 300-30 MG tablet        No Known Allergies Discharge Instructions    Diet general    Complete by:  As directed      Increase activity slowly    Complete by:  As directed           Discharge Exam: Filed Weights   02/09/16 0200 02/09/16 1700  Weight: 101.9 kg (224 lb 10.4 oz) 105 kg (231 lb 7.7 oz)   Filed Vitals:   02/09/16 2200 02/10/16 0627  BP: 118/70 112/73  Pulse: 91 93    Temp: 97.5 F (36.4 C) 97.9 F (36.6 C)  Resp: 18 22   General: Appear in mild distress, no Rash; Oral Mucosa moist Cardiovascular: S1 and S2 Present, no Murmur, no JVD Respiratory: Bilateral Air entry present and Clear to Auscultation, no Crackles, no wheezes Abdomen: Bowel Sound present, Soft and no tenderness Extremities: no Pedal edema, no calf tenderness Neurology: Grossly no focal neuro deficit.  The results of significant diagnostics from this hospitalization (including imaging, microbiology, ancillary and laboratory) are listed below for reference.    Significant Diagnostic Studies: Dg Chest 2 View  01/19/2016  CLINICAL DATA:  Right lung cancer, shortness of breath. EXAM: CHEST  2 VIEW COMPARISON:  Jan 13, 2016. FINDINGS: Stable cardiomediastinal silhouette. Continued presence of large right upper lobe mass. Stable moderate size right pleural effusion is noted. Left lung is clear. No pneumothorax is noted. Bony thorax is unremarkable. IMPRESSION: Stable large right upper lobe mass and moderate size right pleural effusion.  Electronically Signed   By: Marijo Conception, M.D.   On: 01/19/2016 16:20   Dg Chest 2 View  01/13/2016  CLINICAL DATA:  Right pleural effusion EXAM: CHEST  2 VIEW COMPARISON:  01/11/2016 FINDINGS: Right pleural effusion is slightly larger. Right upper lobe mass is stable. No pneumothorax. Normal heart size. IMPRESSION: Right pleural effusion is larger.  Stable right lung mass. Electronically Signed   By: Marybelle Killings M.D.   On: 01/13/2016 14:30   Mr Jeri Cos JI Contrast  01/14/2016  ADDENDUM REPORT: 01/14/2016 11:32 ADDENDUM: Study discussed by telephone with Dr. Curt Bears on 01/14/2016 At 1127 hours. Electronically Signed   By: Genevie Ann M.D.   On: 01/14/2016 11:32  01/14/2016  CLINICAL DATA:  60 year old male with recently diagnosed stage IV (T3, N3, M1b) non-small cell lung cancer. Staging. Subsequent encounter. EXAM: MRI HEAD WITHOUT AND WITH CONTRAST  TECHNIQUE: Multiplanar, multiecho pulse sequences of the brain and surrounding structures were obtained without and with intravenous contrast. CONTRAST:  20 mL MultiHance. COMPARISON:  PET-CT 01/08/2016 FINDINGS: Post-contrast images are degraded by motion artifact despite repeated imaging attempts. There are multiple subcentimeter enhancing lesions in the bilateral cerebral hemispheres most compatible with brain metastases. There is a solitary 16 mm rim enhancing metastasis in the anterior right frontal lobe with surrounding cerebral edema (series 12, image 27 and series 8, image 15). There is also confluent cerebral edema in the left anterior frontal lobe related to a 9 mm metastasis there are (series 8, image 17 and series 12, image 31. There is a solitary punctate metastasis suspected in the posterior superior left cerebellum. In all about 14 small metastases are identified. No dural thickening identified. However, there are also multiple small nonenhancing areas of restricted diffusion in both cerebral and cerebellar hemispheres (series 5). None of these however appears to be enhancing following contrast, such that these areas are most compatible with small scattered acute infarcts. The larger of these have T2 hyperintensity, including in the left splenium of the corpus callosum (series 7, image 16, seen to be nonenhancing on series 12, image 30). There is a superimposed small chronic left cerebellar lacunar infarct. No midline shift or loss of basilar cisterns. No acute intracranial hemorrhage identified. No ventriculomegaly. Major intracranial vascular flow voids are preserved. The left vertebral artery appears dominant. Negative pituitary and cervicomedullary junction. Grossly negative visualized cervical spinal cord. Mildly to moderately heterogeneous bone marrow signal throughout the visible skull and upper cervical spine. Of these, there is associated abnormal diffusion at the left occipital condyle  compatible with bone metastasis and this was hypermetabolic on PET-CT. A right side C2-C3 bone metastasis was better demonstrated by PET. Visible internal auditory structures appear normal. Mastoids are clear. Paranasal sinuses are clear. Negative orbit and scalp soft tissues. IMPRESSION: 1. Positive for multiple small brain metastases. Approximately fourteen enhancing metastases are identified ranging from punctate to 16 mm. There is associated cerebral edema, most pronounced in the anterior frontal lobes, without significant intracranial mass effect. 2. There also are numerous superimposed small nonenhancing areas of restricted diffusion in both cerebral and cerebellar hemispheres, but these are most compatible with small acute embolic infarcts. 3. Left occipital condyle skullbase metastasis. Cervical spine metastatic disease better demonstrated on recent PET-CT. Electronically Signed: By: Genevie Ann M.D. On: 01/14/2016 11:22   US Abdomen Limited  02/09/2016  CLINICAL DATA:  Metastatic lung carcinoma.  Ascites. EXAM: LIMITED ABDOMEN ULTRASOUND FOR ASCITES TECHNIQUE: Limited ultrasound survey for ascites was performed in all  four abdominal quadrants. COMPARISON:  PET-CT on 01/08/2016 FINDINGS: Mild abdominal ascites seen an the left upper and lower quadrants, which is new compared to previous PET-CT. No significant ascites seen in the right upper or lower quadrants, however a right pleural effusion is again noted. IMPRESSION: Mild abdominal ascites in left upper and lower quadrants, new compared to prior PET-CT on 01/08/2016. Right pleural effusion again noted. Electronically Signed   By: Earle Gell M.D.   On: 02/09/2016 12:04   Dg Chest Port 1 View  02/08/2016  CLINICAL DATA:  Weakness.  Lung cancer EXAM: PORTABLE CHEST 1 VIEW COMPARISON:  01/22/2016 FINDINGS: Large right upper lobe malignancy with mediastinal adenopathy. Small right pleural effusion. Tunneled pleural catheter overlapping the right base.  Interstitial coarsening on the right which could be postobstructive or lymphangitic tumor. Left lung is clear. IMPRESSION: Stable from 01/22/2016. Right upper lobe malignancy with mediastinal adenopathy. Tunneled pleural catheter with small right effusion. Electronically Signed   By: Monte Fantasia M.D.   On: 02/08/2016 22:05   Dg Chest Port 1 View  01/22/2016  CLINICAL DATA:  Chest tube placement EXAM: PORTABLE CHEST 1 VIEW COMPARISON:  01/19/2016 chest radiograph. FINDINGS: Right PleurX drain is seen at the basilar right pleural space. Stable cardiomediastinal silhouette with normal heart size. No pneumothorax. Small residual right pleural effusion, not appreciably changed. No left pleural effusion. Stable large right upper lung parahilar mass. Stable mild patchy opacity at the right lung base. IMPRESSION: 1. Right PleurX drain in the basilar right pleural space. Stable small residual right pleural effusion. No pneumothorax. 2. Stable large right upper lung parahilar mass and mild patchy right lung base opacity. Electronically Signed   By: Ilona Sorrel M.D.   On: 01/22/2016 14:00   Dg C-arm 1-60 Min-no Report  01/22/2016  CLINICAL DATA: surgery C-ARM 1-60 MINUTES Fluoroscopy was utilized by the requesting physician.  No radiographic interpretation.    Microbiology: Recent Results (from the past 240 hour(s))  MRSA PCR Screening     Status: None   Collection Time: 02/09/16  4:46 AM  Result Value Ref Range Status   MRSA by PCR NEGATIVE NEGATIVE Final    Comment:        The GeneXpert MRSA Assay (FDA approved for NASAL specimens only), is one component of a comprehensive MRSA colonization surveillance program. It is not intended to diagnose MRSA infection nor to guide or monitor treatment for MRSA infections.      Labs: CBC:  Recent Labs Lab 02/04/16 1123 02/08/16 2248 02/09/16 0720  WBC 21.8* 12.4* 10.9*  NEUTROABS 19.6* 10.3*  --   HGB 13.8 13.2 11.9*  HCT 44.1 38.5* 36.3*    MCV 88.9 85.0 87.5  PLT 12 Platelet count confirmed by slide estimate* 6* 25*   Basic Metabolic Panel:  Recent Labs Lab 02/04/16 1123 02/08/16 2248 02/09/16 0720  NA 128* 130* 135  K 5.3* 5.6* 4.8  CL  --  100* 106  CO2 25 21* 21*  GLUCOSE 523* 323* 196*  BUN 28.3* 46* 44*  CREATININE 0.9 1.09 0.79  CALCIUM 7.7* 7.5* 7.0*   Liver Function Tests:  Recent Labs Lab 02/04/16 1123 02/08/16 2248  AST 11 20  ALT 16 22  ALKPHOS 90 69  BILITOT 0.72 1.1  PROT 5.0* 4.9*  ALBUMIN 2.0* 2.0*    Recent Labs Lab 02/08/16 2248  LIPASE 19   No results for input(s): AMMONIA in the last 168 hours. Cardiac Enzymes:  Recent Labs Lab 02/09/16 0720  TROPONINI 0.12*   BNP (last 3 results) No results for input(s): BNP in the last 8760 hours. CBG:  Recent Labs Lab 02/09/16 0403 02/09/16 0721 02/09/16 1132 02/09/16 1536  GLUCAP 199* 190* 213* 214*   Time spent: 30 minutes  Signed:  Tierrah Anastos  [facility]  02/10/2016  , 3:47 PM

## 2016-02-10 NOTE — Progress Notes (Signed)
[id number]  [facility] Li[facility] note  Received request from [place] for family interest in [facility].    Chart has been  reviewed.   Met with both patient, wife and daughter to confirm interest and explain services.  Unfortunately we do not have a bed available at [facility] for today.   Discussed with family and they indicated an understanding, although they are very interested in [facility].    [facility] will follow up again tomorrow for availability and continued interest in [facility].     Thank you for this referral.   We will continue to monitor.  Mickie Kay, [facility] Liason  [phone]

## 2016-02-10 NOTE — Progress Notes (Signed)
Pt wife drained pluerex at 2200. 700 ml of serous drainage as output. Dressing dry, clean and intact.

## 2016-02-10 NOTE — Progress Notes (Signed)
CSW assisting with d/c planning. PN reviewed. Spoke with Collie Siad from [employer]. Met with pt / family at bedside. With pt/family permission, [facility] has been contacted and referral provided. Liaison for hospice home will contact CSW with a decision regarding placement decision.   Werner Lean LCSW [phone]

## 2016-02-14 LAB — CULTURE, BLOOD (ROUTINE X 2)
Culture: NO GROWTH
Culture: NO GROWTH

## 2016-02-16 ENCOUNTER — Ambulatory Visit: Payer: PRIVATE HEALTH INSURANCE | Admitting: Internal Medicine

## 2016-02-23 ENCOUNTER — Other Ambulatory Visit: Payer: PRIVATE HEALTH INSURANCE

## 2016-02-23 ENCOUNTER — Telehealth: Payer: Self-pay | Admitting: Internal Medicine

## 2016-02-23 ENCOUNTER — Encounter: Payer: PRIVATE HEALTH INSURANCE | Admitting: Genetic Counselor

## 2016-02-23 NOTE — Telephone Encounter (Signed)
Staff message received in HIM from desk nurse re patient's death. Patient status changed to deceased.

## 2016-02-25 ENCOUNTER — Ambulatory Visit: Payer: PRIVATE HEALTH INSURANCE | Admitting: Cardiothoracic Surgery

## 2016-03-01 ENCOUNTER — Ambulatory Visit: Payer: PRIVATE HEALTH INSURANCE

## 2016-03-01 ENCOUNTER — Other Ambulatory Visit: Payer: PRIVATE HEALTH INSURANCE

## 2016-03-01 NOTE — Progress Notes (Signed)
  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  Whole brain radiation treatment to a dose of 30 gray in 10 fractions. 2.  Palliative radiation treatment to the right lung to a dose of 30 gray in 10 fractions    Narrative: The patient tolerated radiation treatment relatively well.     Plan: The patient has completed radiation treatment. The patient will return to radiation oncology clinic for routine followup in one month. I advised the patient to call or return sooner if they have any questions or concerns related to their recovery or treatment. ________________________________  Jodelle Gross, M.D., Ph.D.

## 2016-03-05 DEATH — deceased

## 2016-03-10 ENCOUNTER — Ambulatory Visit: Payer: PRIVATE HEALTH INSURANCE | Admitting: Radiation Oncology

## 2016-08-13 ENCOUNTER — Other Ambulatory Visit: Payer: Self-pay | Admitting: Nurse Practitioner

## 2018-02-11 IMAGING — CT NM PET TUM IMG INITIAL (PI) SKULL BASE T - THIGH
1 of 8 series · 1 of 25 positions shown · non-contrast
Comparison: CT chest dated 12/31/2015

CLINICAL DATA: Initial treatment strategy for lung mass.

EXAM:
NUCLEAR MEDICINE PET SKULL BASE TO THIGH
TECHNIQUE: 12.86 mCi F-18 FDG was injected intravenously. Full-ring PET imaging
was performed from the skull base to thigh after the radiotracer. CT
data was obtained and used for attenuation correction and anatomic
localization.
FASTING BLOOD GLUCOSE:  Value: 126 mg/dl

[Series 4: ct sk_thigh 5.0 b31f · axial · 5.0mm · 0.98mm/px · 1 of 226 slices shown]
[im 226/226  brain]
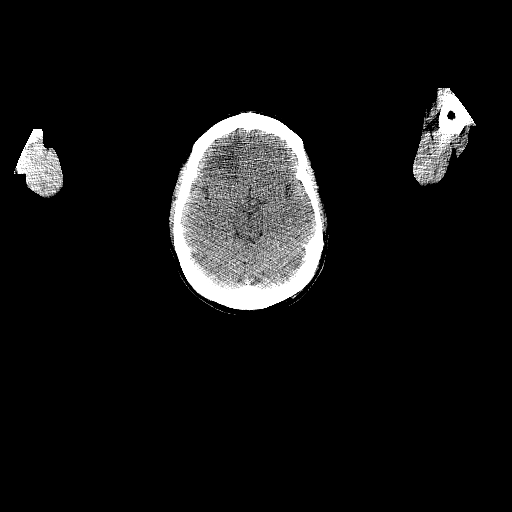

[1 of 25 positions shown; findings below may reference images not displayed]

FINDINGS: NECK

No hypermetabolic lymph nodes in the neck.

Two dominant lesions in the left thyroid gland, mid representative
max SUV 13.5, nonspecific.

CHEST

Right upper and middle lobe mass, max SUV 16.0, compatible with
primary bronchogenic neoplasm. This was better evaluated on recent
CT, when it measured approximately 11.9 x 8.0 x 11.4 cm.

Associated near complete collapse of the right lung secondary to a
large right pleural effusion, malignant, with multiple enhancing
pleural-based nodules, representative max SUV 16.2.

Left lung is grossly clear.  No pneumothorax.

Widespread thoracic lymphadenopathy, max SUV 16.6 in the subcarinal
region. 9 mm short axis left supraclavicular nodes are present with
max SUV 9.1.

The heart is normal in size.  Trace pericardial fluid.

ABDOMEN/PELVIS

Motion degraded images.

Three hepatic metastases, including a 2.1 cm lesion in segment 7
with max SUV 20.1 and a 1.5 cm lesion in segment 4A with max SUV
14.7.

No abnormal hypermetabolic activity within the pancreas, adrenal
glands, or spleen.

No hypermetabolic lymph nodes in the abdomen or pelvis.

SKELETON

Innumerable intramuscular and subcutaneous soft tissue metastases.
For example, representative max SUV 17.2 along the posterior left
shoulder and representative max SUV 15.2 in the left gluteal region.

Multifocal osseous metastases in the visualized axial and
appendicular skeleton. For example:

--Left clivus/skull base, max SUV

--Lower cervical spine, max SUV

--Left acromion, max SUV

--Lower thoracic spine, max SUV

--Left proximal femur, max SUV
IMPRESSION: Stage IV right lung cancer, with widespread nodal, pleural, hepatic,
soft tissue, and osseous metastases, as above.

## 2018-02-12 IMAGING — CR DG CHEST 2V
2 series · 2 of 2 positions shown · non-contrast
Comparison: 01/08/2016

CLINICAL DATA: 59-year-old male with a history of shortness of
breath

EXAM:
CHEST - 2 VIEW

[w chest pa]
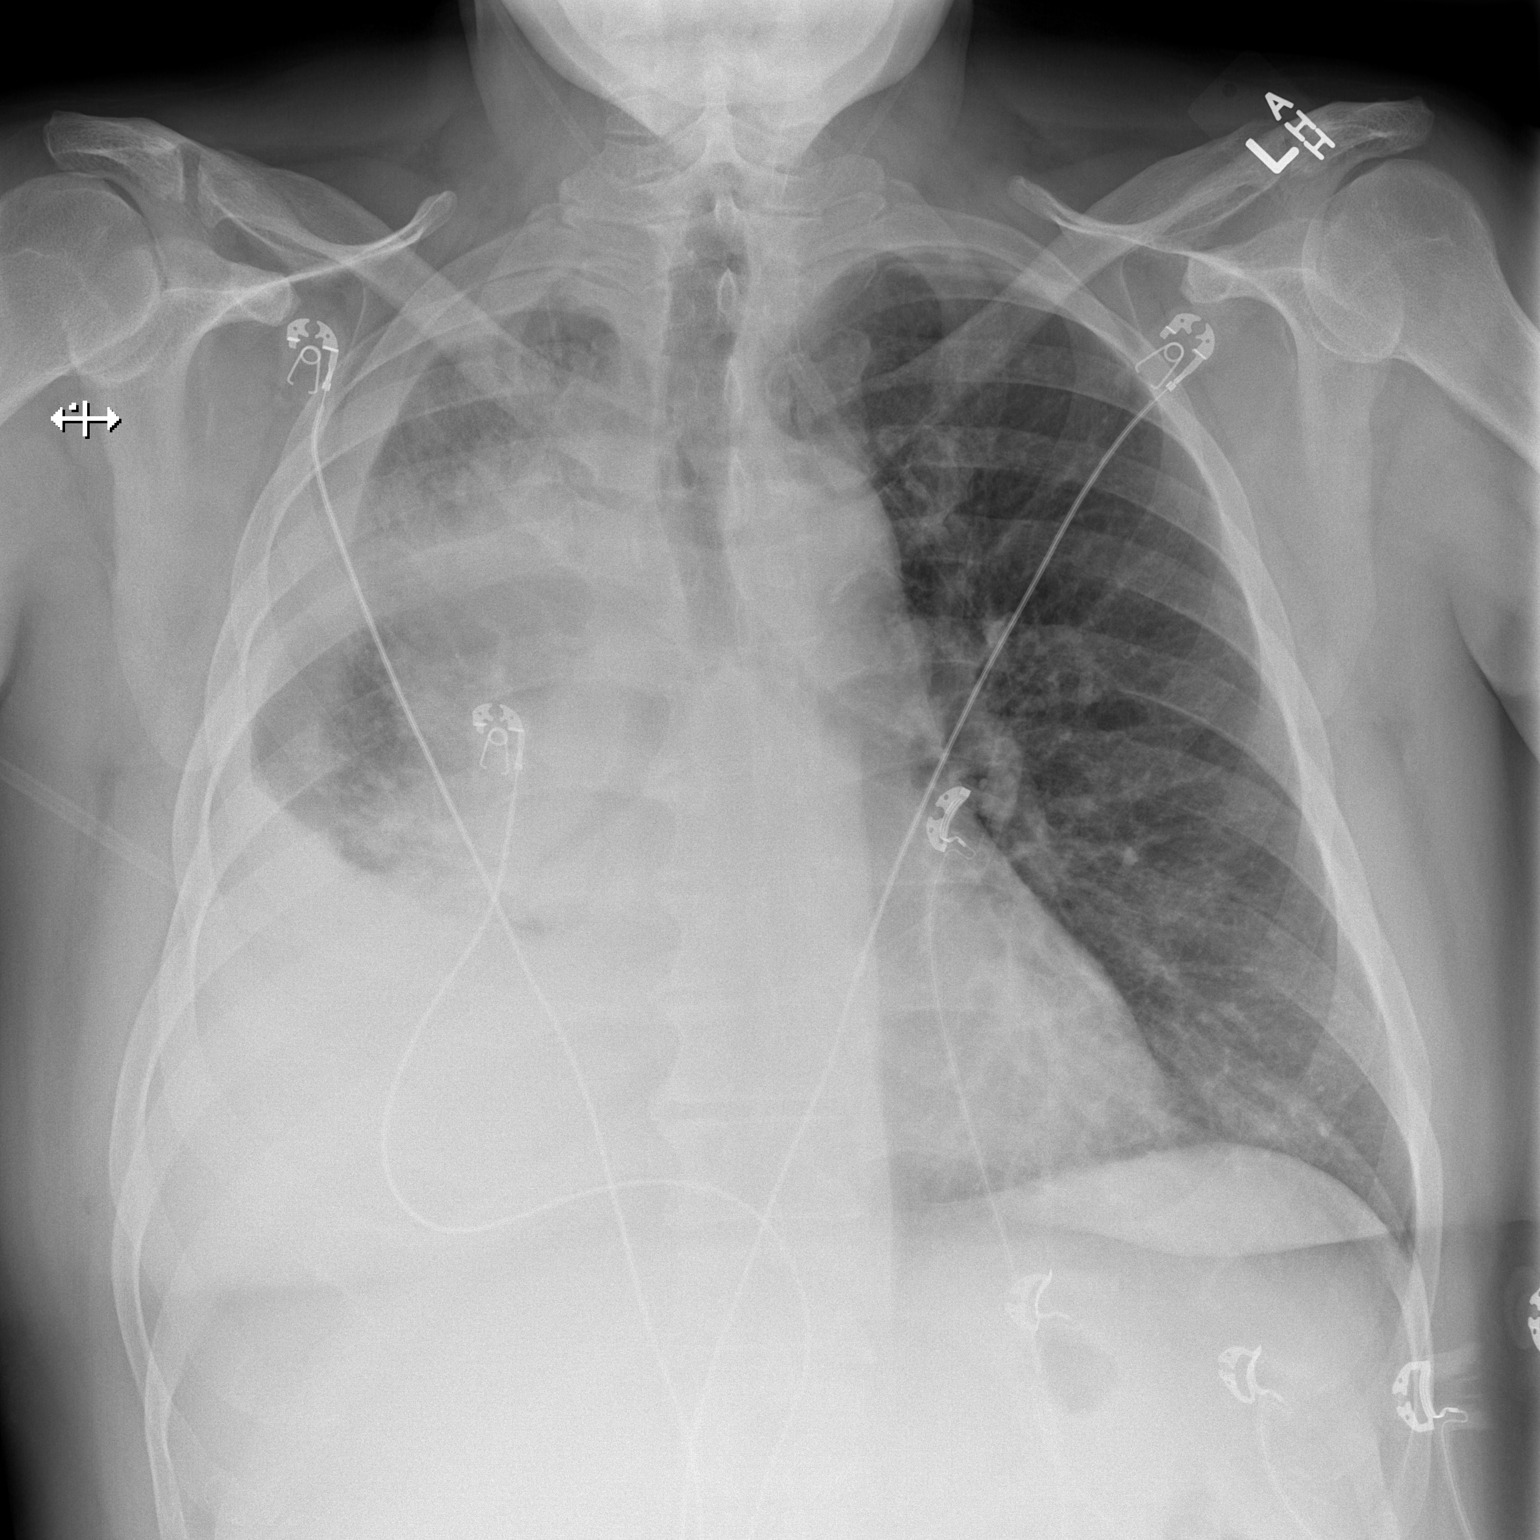

[w chest lat]
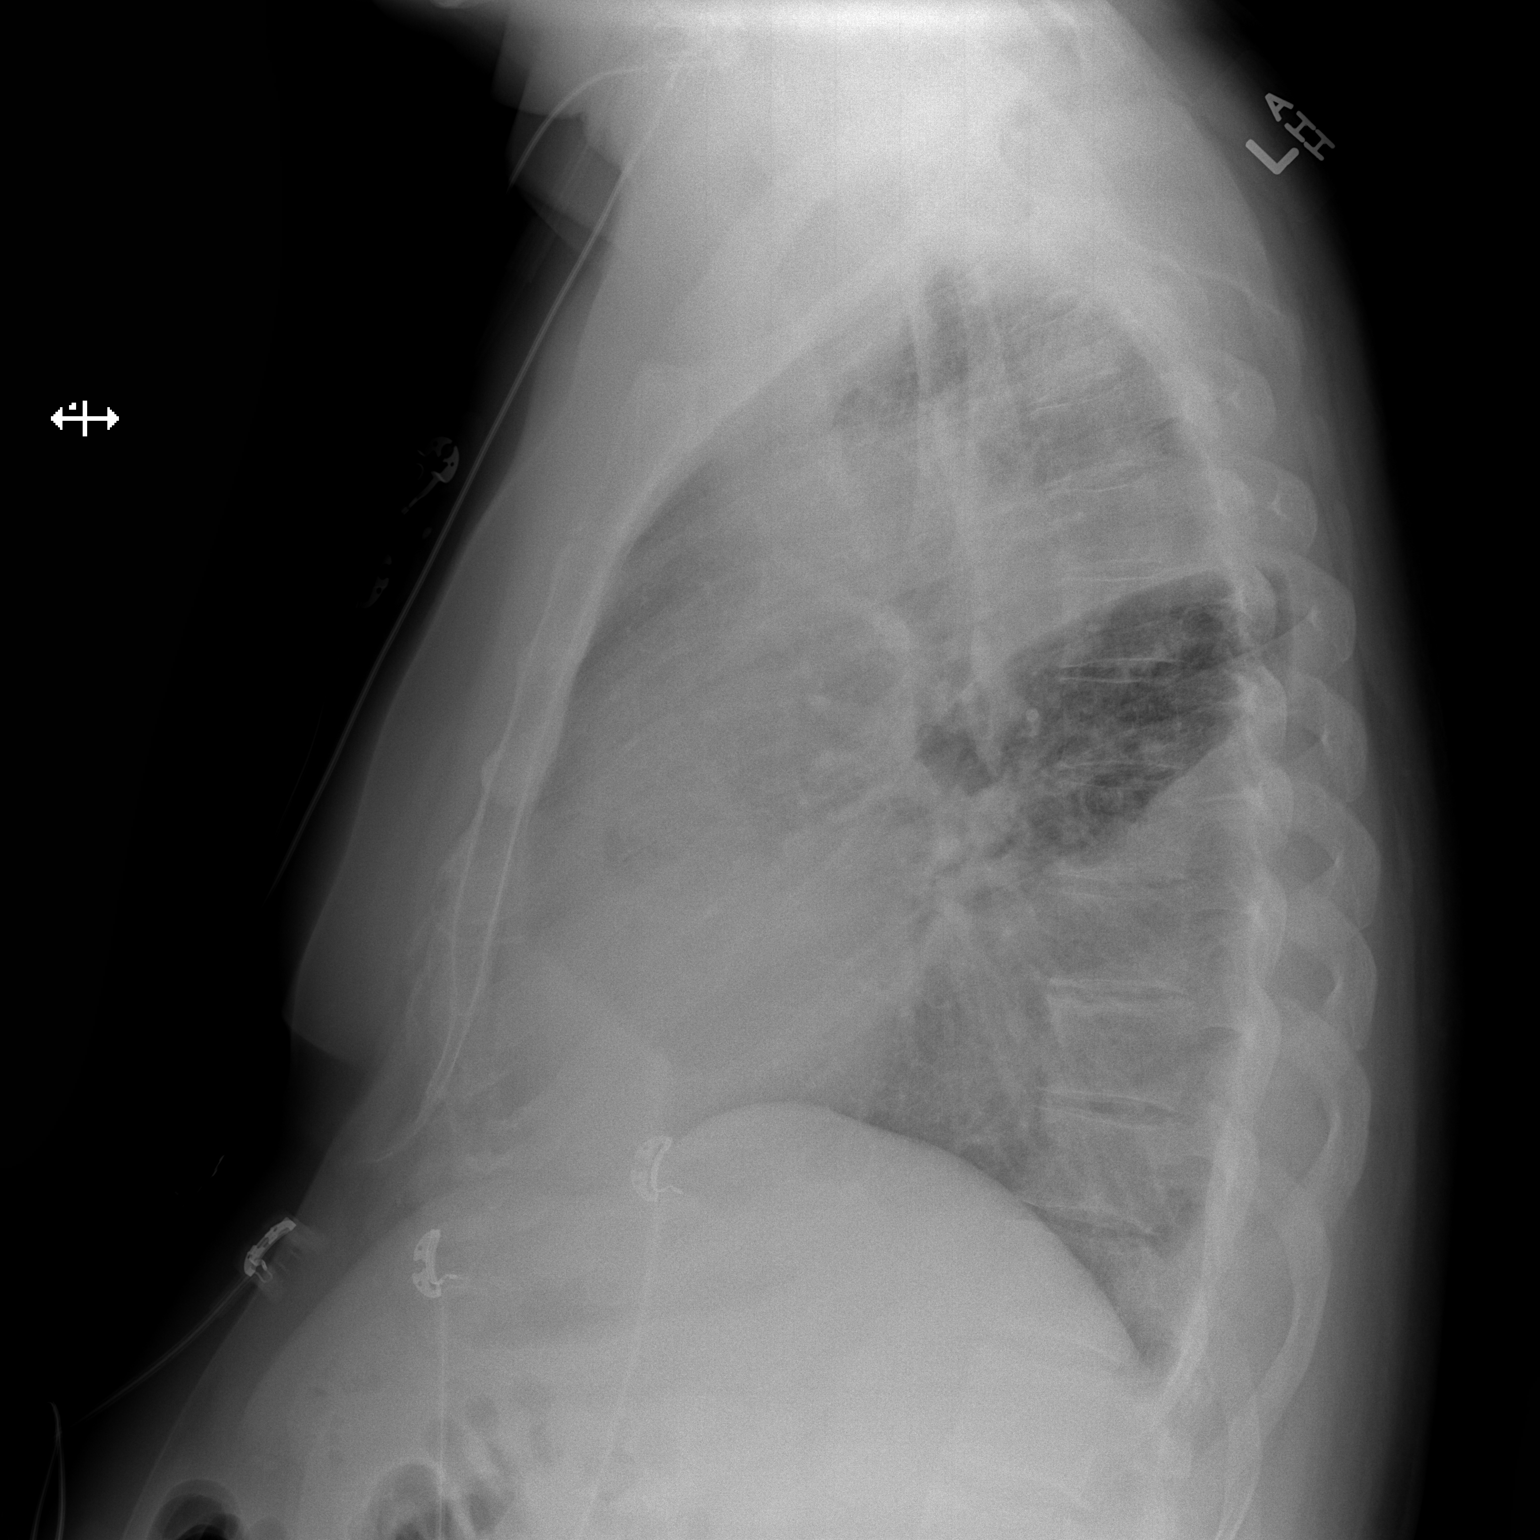

[2 of 2 positions shown; findings below may reference images not displayed]

FINDINGS: Cardiomediastinal silhouette likely unchanged with the right heart
border obscured by overlying lung/pleural disease.

Left lung remains relatively well aerated, with minimal interstitial
opacities. No pneumothorax.

Improved opacity on the right with improved aeration of the right
upper lung. Pleural parenchymal thickening with dense opacity at the
right base persists.

No displaced fracture.

No displaced fracture.
IMPRESSION: Improved aeration on the right, with persisting pleural parenchymal
thickening and dense opacity at the right base compatible with
combination of known tumor and pleural effusion. No pneumothorax.

Left lung relatively well aerated.
